# Patient Record
Sex: Male | Born: 1975 | Race: Black or African American | Hispanic: No | Marital: Married | Smoking: Never smoker
Health system: Southern US, Community
[De-identification: ages and names within clinical notes are randomized; demographics above are authoritative.]

## PROBLEM LIST (undated history)

## (undated) DIAGNOSIS — E349 Endocrine disorder, unspecified: Secondary | ICD-10-CM

## (undated) DIAGNOSIS — E785 Hyperlipidemia, unspecified: Secondary | ICD-10-CM

## (undated) DIAGNOSIS — K089 Disorder of teeth and supporting structures, unspecified: Secondary | ICD-10-CM

## (undated) DIAGNOSIS — E559 Vitamin D deficiency, unspecified: Secondary | ICD-10-CM

## (undated) DIAGNOSIS — R000 Tachycardia, unspecified: Secondary | ICD-10-CM

## (undated) DIAGNOSIS — Z8781 Personal history of (healed) traumatic fracture: Secondary | ICD-10-CM

## (undated) DIAGNOSIS — L309 Dermatitis, unspecified: Secondary | ICD-10-CM

## (undated) DIAGNOSIS — K219 Gastro-esophageal reflux disease without esophagitis: Secondary | ICD-10-CM

## (undated) DIAGNOSIS — R5383 Other fatigue: Secondary | ICD-10-CM

## (undated) DIAGNOSIS — M25552 Pain in left hip: Secondary | ICD-10-CM

## (undated) DIAGNOSIS — E119 Type 2 diabetes mellitus without complications: Secondary | ICD-10-CM

## (undated) DIAGNOSIS — I10 Essential (primary) hypertension: Secondary | ICD-10-CM

## (undated) DIAGNOSIS — J302 Other seasonal allergic rhinitis: Secondary | ICD-10-CM

## (undated) DIAGNOSIS — F319 Bipolar disorder, unspecified: Secondary | ICD-10-CM

## (undated) DIAGNOSIS — Z832 Family history of diseases of the blood and blood-forming organs and certain disorders involving the immune mechanism: Secondary | ICD-10-CM

## (undated) DIAGNOSIS — G43909 Migraine, unspecified, not intractable, without status migrainosus: Secondary | ICD-10-CM

## (undated) DIAGNOSIS — N529 Male erectile dysfunction, unspecified: Secondary | ICD-10-CM

## (undated) DIAGNOSIS — G5702 Lesion of sciatic nerve, left lower limb: Secondary | ICD-10-CM

## (undated) DIAGNOSIS — N183 Chronic kidney disease, stage 3 unspecified: Secondary | ICD-10-CM

## (undated) DIAGNOSIS — E213 Hyperparathyroidism, unspecified: Secondary | ICD-10-CM

## (undated) DIAGNOSIS — E118 Type 2 diabetes mellitus with unspecified complications: Secondary | ICD-10-CM

## (undated) DIAGNOSIS — E291 Testicular hypofunction: Secondary | ICD-10-CM

## (undated) HISTORY — DX: Migraine, unspecified, not intractable, without status migrainosus: G43.909

## (undated) HISTORY — DX: Bipolar disorder, unspecified: F31.9

## (undated) HISTORY — DX: Tachycardia, unspecified: R00.0

## (undated) HISTORY — DX: Dermatitis, unspecified: L30.9

## (undated) HISTORY — DX: Morbid (severe) obesity due to excess calories: E66.01

## (undated) HISTORY — DX: Chronic kidney disease, stage 3 unspecified: N18.30

## (undated) HISTORY — DX: Pain in left hip: M25.552

## (undated) HISTORY — DX: Hyperlipidemia, unspecified: E78.5

## (undated) HISTORY — DX: Family history of diseases of the blood and blood-forming organs and certain disorders involving the immune mechanism: Z83.2

## (undated) HISTORY — DX: Other fatigue: R53.83

## (undated) HISTORY — DX: Lesion of sciatic nerve, left lower limb: G57.02

## (undated) HISTORY — DX: Hyperparathyroidism, unspecified: E21.3

## (undated) HISTORY — DX: Disorder of teeth and supporting structures, unspecified: K08.9

## (undated) HISTORY — DX: Male erectile dysfunction, unspecified: N52.9

## (undated) HISTORY — DX: Gastro-esophageal reflux disease without esophagitis: K21.9

## (undated) HISTORY — DX: Type 2 diabetes mellitus with unspecified complications: E11.8

## (undated) HISTORY — DX: Other seasonal allergic rhinitis: J30.2

---

## 2003-08-12 ENCOUNTER — Emergency Department (HOSPITAL_COMMUNITY): Admission: AD | Admit: 2003-08-12 | Discharge: 2003-08-12 | Payer: Self-pay | Admitting: Family Medicine

## 2004-08-27 ENCOUNTER — Emergency Department (HOSPITAL_COMMUNITY): Admission: EM | Admit: 2004-08-27 | Discharge: 2004-08-27 | Payer: Self-pay | Admitting: Emergency Medicine

## 2004-09-04 ENCOUNTER — Emergency Department (HOSPITAL_COMMUNITY): Admission: EM | Admit: 2004-09-04 | Discharge: 2004-09-04 | Payer: Self-pay | Admitting: Family Medicine

## 2004-09-09 ENCOUNTER — Emergency Department (HOSPITAL_COMMUNITY): Admission: EM | Admit: 2004-09-09 | Discharge: 2004-09-09 | Payer: Self-pay | Admitting: Family Medicine

## 2004-09-14 ENCOUNTER — Emergency Department (HOSPITAL_COMMUNITY): Admission: EM | Admit: 2004-09-14 | Discharge: 2004-09-14 | Payer: Self-pay | Admitting: Family Medicine

## 2004-11-07 ENCOUNTER — Emergency Department (HOSPITAL_COMMUNITY): Admission: EM | Admit: 2004-11-07 | Discharge: 2004-11-07 | Payer: Self-pay | Admitting: Family Medicine

## 2005-06-10 ENCOUNTER — Emergency Department (HOSPITAL_COMMUNITY): Admission: EM | Admit: 2005-06-10 | Discharge: 2005-06-10 | Payer: Self-pay | Admitting: Emergency Medicine

## 2006-06-12 ENCOUNTER — Emergency Department (HOSPITAL_COMMUNITY): Admission: EM | Admit: 2006-06-12 | Discharge: 2006-06-12 | Payer: Self-pay | Admitting: Family Medicine

## 2006-06-15 ENCOUNTER — Emergency Department (HOSPITAL_COMMUNITY): Admission: EM | Admit: 2006-06-15 | Discharge: 2006-06-15 | Payer: Self-pay | Admitting: Emergency Medicine

## 2006-09-03 ENCOUNTER — Emergency Department (HOSPITAL_COMMUNITY): Admission: EM | Admit: 2006-09-03 | Discharge: 2006-09-03 | Payer: Self-pay | Admitting: Emergency Medicine

## 2006-09-05 ENCOUNTER — Emergency Department (HOSPITAL_COMMUNITY): Admission: EM | Admit: 2006-09-05 | Discharge: 2006-09-05 | Payer: Self-pay | Admitting: Family Medicine

## 2006-12-08 ENCOUNTER — Emergency Department (HOSPITAL_COMMUNITY): Admission: EM | Admit: 2006-12-08 | Discharge: 2006-12-08 | Payer: Self-pay | Admitting: Emergency Medicine

## 2007-04-24 ENCOUNTER — Observation Stay (HOSPITAL_COMMUNITY): Admission: EM | Admit: 2007-04-24 | Discharge: 2007-04-25 | Payer: Self-pay | Admitting: Emergency Medicine

## 2007-04-24 ENCOUNTER — Ambulatory Visit: Payer: Self-pay | Admitting: *Deleted

## 2007-08-17 ENCOUNTER — Emergency Department (HOSPITAL_COMMUNITY): Admission: EM | Admit: 2007-08-17 | Discharge: 2007-08-17 | Payer: Self-pay | Admitting: Family Medicine

## 2007-12-11 ENCOUNTER — Emergency Department (HOSPITAL_COMMUNITY): Admission: EM | Admit: 2007-12-11 | Discharge: 2007-12-11 | Payer: Self-pay | Admitting: Emergency Medicine

## 2010-04-27 ENCOUNTER — Emergency Department (HOSPITAL_COMMUNITY)
Admission: EM | Admit: 2010-04-27 | Discharge: 2010-04-27 | Payer: Self-pay | Source: Home / Self Care | Admitting: Emergency Medicine

## 2010-04-28 ENCOUNTER — Emergency Department (HOSPITAL_COMMUNITY)
Admission: EM | Admit: 2010-04-28 | Discharge: 2010-04-29 | Payer: Self-pay | Source: Home / Self Care | Admitting: Emergency Medicine

## 2010-09-30 NOTE — Discharge Summary (Signed)
NAMESTEPHANIE, [name] NO.:  [id number]   MEDICAL RECORD NO.:  [id number]          PATIENT TYPE:  INP   LOCATION:  6529                         FACILITY:  [facility]   PHYSICIAN:  Manning Charity, MD     DATE OF BIRTH:  1975-07-21   DATE OF ADMISSION:  04/24/2007  DATE OF DISCHARGE:  04/25/2007                               DISCHARGE SUMMARY   DATE OF ADMISSION:  April 24, 2007   DATE OF DISCHARGE:  April 25, 2007   DISCHARGE DIAGNOSES:  1. Left arm numbness, etiology unclear.  2. Diabetes mellitus type 2.  3. Elevated blood pressure, likely hypertension.  4. Hyperlipidemia.  5. Gastroesophageal reflux disease.   DISCHARGE MEDICATIONS:  1. Metformin 500 mg by mouth twice daily.  2. Actos 30 mg by mouth daily.   CONSULTATIONS:  None.   PROCEDURES:  A plain film of the chest on April 24, 2007,  demonstrated cardiomegaly without evidence of acute cardiopulmonary  disease.   ADMISSION HISTORY:  [name] is a 35 year old African American man  with a history of type 2 diabetes mellitus diagnosed 3 years prior to  admission and the family history of cerebrovascular accidents who  presented in [facility] Emergency Department by EMS on April 24, 2007, complaining of left arm numbness which occurred on waking up in  the morning of admission.  Patient endorsed tremors and slight  dizziness, typically occurring with hypoglycemic attacks, what the  patient gets almost daily.  On the day of admission, he endorsed  numbness lasting for a few seconds, subsequently disappearing after he  drank a beverage containing sugar.  However, the patient's numbness  recurred approximately 10 minutes later.  Because of this recurrent  numbness and associated dizziness and tremors, the patient called EMS.  Although the patient denied any history of frank chest pain, he did  receive sublingual nitroglycerin in the ED had resolution of his  numbness.  He denies any history  of chest pain, shortness of breath,  palpitations, vomiting or burning urination.  He does endorse  intermittent nausea.  Of note, he endorsed taking 10 Extra Strength  Tylenol tablets in the 24 hours prior to admission secondary to  generalized aches and pains.   ADMISSION PHYSICAL EXAMINATION:  VITAL SIGNS:  Temperature 97.2 degrees  Fahrenheit, blood pressure 148/110, pulse 101, respiration rate 22,  oxygen saturation 97% on room air.  GENERAL:  No acute distress.  HEENT:  Pupils equal, round, regular and reactive to light and  accommodation, extraocular movements intact.  Oropharynx clear.  NECK:  Supple without lymphadenopathy or thyromegaly.  RESPIRATORY:  Equal air entry bilaterally.  Clear to auscultation.  CARDIOVASCULAR:  Regular rate and rhythm without murmurs, rubs or  gallops.  GASTROINTESTINAL:  Soft, nontender, nondistended with positive bowel  sounds.  EXTREMITIES:  No edema.  SKIN:  Left forearm with multiple healed scars 1/2 to 1 cm in diameter.  NEUROLOGIC:  Alert and oriented x3.  Cranial nerves II through XII  intact.  Strength 5/5 bilaterally.  Reflex is 2+ and symmetric  bilaterally.  Sensation to light  touch intact bilaterally.  Finger-to-  nose intact.  PSYCHIATRIC:  Appropriate.   ADMISSION LABORATORIES:  Laboratory studies on the day of admission  revealed:  Sodium 137, potassium 3.8, chloride 107, bicarbonate 27, BUN  5, creatinine 0.89, glucose 155, calcium 9.2.  White blood cell count  6.4, hemoglobin 13.8, platelet count 311.  AST 22, ALT 27, alkaline  phosphatase 49, bilirubin 0.6, total protein 7.3, albumin 3.5.  Hemoglobin A1C was 8%.  Urine drug screen was negative.  Urinalysis  revealed specific gravity of 1.034 with greater than 1000 glucose and 30  protein.  A Tylenol level was 15.1 on admission and subsequently less  than 10 approximately 4 hours later.  Cardiac enzymes are negative times  3.   HOSPITAL COURSE:  1. Left arm numbness.  The  patient's initial symptoms were somewhat      confusing.  He did endorse an intermittent left arm numbness, which      was concerning to him, and denied associated chest pain, but did      receive nitroglycerin with some relief in the emergency department.      Because of this, the differential was thought to include acute      coronary syndrome or acute myocardial infarction, pneumothorax,      pneumonia, pulmonary embolus, neuropathy including nerve      impingement, trauma or a tendinopathy or neuropathy.  On further      questioning, the patient also endorsed a history of headaches, so      migraine was considered to be a possible cause.  Work up as entered      previously was felt to be low yield.  The patient was ruled out for      acute MI by EKGs and cardiac enzymes, and again had no chest pain      either before or during admission.  Because of the unusual      characteristics of the patient's left arm numbness, other causes      were felt to be unlikely such as pneumothorax, pneumonia or      pulmonary embolus.  Plain imaging of the chest did not reveal acute      fracture, and the patient did not have a history of trauma to      explain any neurologic injury to the left arm.  Given that the      symptoms resolved relatively quickly and were associated with      headache, it was felt that the most likely cause would be related      to migraine with some component of exacerbation due to recurrent      hypoglycemic episodes.  Patient had no residual deficits at      discharge.  2. Type 2 diabetes mellitus.  Hemoglobin A1C was 8%.  The patient      endorsed a clear history of near daily episodes of hypoglycemia      associated with taking glimepiride.  Because of this, the patient's      glimepiride was changed to metformin.  He did endorse a possible      history of gastrointestinal side effects associated with metformin,      but we elected to try him on this medication given  recurrent      hypoglycemic episodes with the sulfonouria .  3. Increased blood pressure.  This is likely hypertension, though a      formal diagnosis will need to be made on an outpatient  basis after      2 or more blood pressure checks.  We did feel that he would likely      benefit from some lisinopril given his underlying diabetes      mellitus, but given his fluctuating blood pressures and without a      formal diagnosis of hypertension, this medication was not started.      We also advised the patient that weight loss would be helpful.  4. Fluids, electrolytes and nutrition.  The patient was given a carb-      modified diet during this admission.  A fasting lipid profile was      checked and revealed total cholesterol 187, triglycerides 187, LDL      126 and HDL 24.  5. Hyperlipidemia.  See cholesterol results above.  The patient will      likely benefit from a statin in the future, but again this is a      primary care issue.  6. Disposition.  The patient was discharged home in good condition.      Optimization of his blood pressure, lipids, and glucose therapy can      be made as an outpatient.  If the patient has recurrent symptoms of      left arm numbness, especially associated with headache, I would      consider referral to a neurologist.   DISCHARGE LABORATORIES:  Laboratory studies on day of discharge  revealed:  Sodium 135, potassium 3.3 prior to repletion, chloride 102,  bicarbonate 27, BUN 4, creatinine 0.90, glucose 180, calcium 8.7.  White  blood cell count 6.8, hemoglobin 12.8, platelet count 270.  Cardiac  enzymes were negative times 3 sets.   DISCHARGE VITALS:  VITAL SIGNS:  Temperature 98.4 degrees Fahrenheit,  blood pressure 131/61, pulse 85, respiration rate 17, oxygen saturation  96% on room air.  CBGs prior to discharge were 155 and 152.   DISPOSITION AND FOLLOWUP:  The patient is follow up as an outpatient  with Dr. Concepcion Elk on Thursday December 18 at  10:30 a.m.  The patient was  given Dr. Albertina Parr phone number [phone], incase he needs to reschedule  or cancel this appointment.      Madelaine Etienne, MD  Electronically Signed      Manning Charity, MD  Electronically Signed    JH/MEDQ  D:  07/23/2007  T:  07/23/2007  Job:  [id number]   cc:   Fleet Contras, M.D.

## 2011-02-20 LAB — TROPONIN I: Troponin I: 0.02

## 2011-02-20 LAB — DIFFERENTIAL
Basophils Absolute: 0
Basophils Relative: 0
Eosinophils Absolute: 0.1 — ABNORMAL LOW
Eosinophils Relative: 1
Lymphocytes Relative: 41
Lymphs Abs: 2.6
Monocytes Absolute: 0.7
Monocytes Relative: 11
Neutro Abs: 3
Neutrophils Relative %: 47

## 2011-02-20 LAB — COMPREHENSIVE METABOLIC PANEL
ALT: 27
AST: 22
Albumin: 3.5
Alkaline Phosphatase: 49
BUN: 5 — ABNORMAL LOW
CO2: 27
Calcium: 9.2
Chloride: 102
Creatinine, Ser: 0.89
GFR calc Af Amer: >60
GFR calc non Af Amer: >60
Glucose, Bld: 155 — ABNORMAL HIGH
Potassium: 3.8
Sodium: 137
Total Bilirubin: 0.6
Total Protein: 7.3

## 2011-02-20 LAB — BASIC METABOLIC PANEL
BUN: 4 — ABNORMAL LOW
CO2: 27
Calcium: 8.7
Chloride: 102
Creatinine, Ser: 0.9
GFR calc Af Amer: >60
GFR calc non Af Amer: >60
Glucose, Bld: 180 — ABNORMAL HIGH
Potassium: 3.3 — ABNORMAL LOW
Sodium: 135

## 2011-02-20 LAB — CBC
HCT: 37.9 — ABNORMAL LOW
HCT: 41
Hemoglobin: 12.8 — ABNORMAL LOW
Hemoglobin: 13.8
MCHC: 33.6
MCHC: 33.7
MCV: 89.1
MCV: 89.5
Platelets: 270
Platelets: 311
RBC: 4.25
RBC: 4.58
RDW: 12
RDW: 12.4
WBC: 6.4
WBC: 6.8

## 2011-02-20 LAB — POCT CARDIAC MARKERS
CKMB, poc: 1.1
CKMB, poc: <1 — ABNORMAL LOW
Myoglobin, poc: 76.2
Myoglobin, poc: 79.8
Operator id: 198171
Operator id: 282201
Troponin i, poc: <0.05
Troponin i, poc: <0.05

## 2011-02-20 LAB — ACETAMINOPHEN LEVEL
Acetaminophen (Tylenol), Serum: 15.1
Acetaminophen (Tylenol), Serum: <10 — ABNORMAL LOW

## 2011-02-20 LAB — I-STAT 8, (EC8 V) (CONVERTED LAB)
Acid-Base Excess: 3 — ABNORMAL HIGH
BUN: 5 — ABNORMAL LOW
Bicarbonate: 28 — ABNORMAL HIGH
Chloride: 103
Glucose, Bld: 175 — ABNORMAL HIGH
HCT: 45
Hemoglobin: 15.3
Operator id: 282201
Potassium: 3.6
Sodium: 137
TCO2: 29
pCO2, Ven: 41.6 — ABNORMAL LOW
pH, Ven: 7.436 — ABNORMAL HIGH

## 2011-02-20 LAB — CK TOTAL AND CKMB (NOT AT ARMC)
CK, MB: 1.2
Relative Index: 0.7
Total CK: 162

## 2011-02-20 LAB — URINALYSIS, MICROSCOPIC ONLY
Bilirubin Urine: NEGATIVE
Glucose, UA: >1000 — AB
Hgb urine dipstick: NEGATIVE
Ketones, ur: NEGATIVE
Leukocytes, UA: NEGATIVE
Nitrite: NEGATIVE
Protein, ur: 30 — AB
Specific Gravity, Urine: 1.034 — ABNORMAL HIGH
Urobilinogen, UA: 0.2
pH: 6.5

## 2011-02-20 LAB — CARDIAC PANEL(CRET KIN+CKTOT+MB+TROPI)
CK, MB: 0.8
CK, MB: 1.2
Relative Index: 0.6
Relative Index: 0.7
Total CK: 133
Total CK: 169
Troponin I: 0.02
Troponin I: 0.02

## 2011-02-20 LAB — HEMOGLOBIN A1C
Hgb A1c MFr Bld: 8 — ABNORMAL HIGH
Mean Plasma Glucose: 208

## 2011-02-20 LAB — MICROALBUMIN / CREATININE URINE RATIO
Creatinine, Urine: 232.7
Microalb Creat Ratio: 16.6
Microalb, Ur: 3.86 — ABNORMAL HIGH

## 2011-02-20 LAB — RAPID URINE DRUG SCREEN, HOSP PERFORMED
Amphetamines: NOT DETECTED
Barbiturates: NOT DETECTED
Benzodiazepines: NOT DETECTED
Cocaine: NOT DETECTED
Opiates: NOT DETECTED
Tetrahydrocannabinol: NOT DETECTED

## 2011-02-20 LAB — LIPID PANEL
Cholesterol: 187
HDL: 24 — ABNORMAL LOW
LDL Cholesterol: 126 — ABNORMAL HIGH
Total CHOL/HDL Ratio: 7.8
Triglycerides: 187 — ABNORMAL HIGH
VLDL: 37

## 2011-02-20 LAB — POCT I-STAT CREATININE
Creatinine, Ser: 1
Operator id: 282201

## 2013-09-22 ENCOUNTER — Ambulatory Visit: Payer: Self-pay

## 2013-11-03 ENCOUNTER — Ambulatory Visit: Payer: Self-pay

## 2013-12-04 ENCOUNTER — Emergency Department (HOSPITAL_COMMUNITY)
Admission: EM | Admit: 2013-12-04 | Discharge: 2013-12-04 | Disposition: A | Discharge status: Home / Self Care | Payer: Self-pay | Attending: Emergency Medicine | Admitting: Emergency Medicine

## 2013-12-04 ENCOUNTER — Encounter (HOSPITAL_COMMUNITY): Payer: Self-pay | Admitting: Emergency Medicine

## 2013-12-04 DIAGNOSIS — Z79899 Other long term (current) drug therapy: Secondary | ICD-10-CM | POA: Insufficient documentation

## 2013-12-04 DIAGNOSIS — K089 Disorder of teeth and supporting structures, unspecified: Secondary | ICD-10-CM | POA: Insufficient documentation

## 2013-12-04 DIAGNOSIS — X500XXA Overexertion from strenuous movement or load, initial encounter: Secondary | ICD-10-CM | POA: Insufficient documentation

## 2013-12-04 DIAGNOSIS — S66911A Strain of unspecified muscle, fascia and tendon at wrist and hand level, right hand, initial encounter: Secondary | ICD-10-CM

## 2013-12-04 DIAGNOSIS — X503XXA Overexertion from repetitive movements, initial encounter: Secondary | ICD-10-CM | POA: Insufficient documentation

## 2013-12-04 DIAGNOSIS — Y9289 Other specified places as the place of occurrence of the external cause: Secondary | ICD-10-CM | POA: Insufficient documentation

## 2013-12-04 DIAGNOSIS — R6884 Jaw pain: Secondary | ICD-10-CM | POA: Insufficient documentation

## 2013-12-04 DIAGNOSIS — Z7982 Long term (current) use of aspirin: Secondary | ICD-10-CM | POA: Insufficient documentation

## 2013-12-04 DIAGNOSIS — I10 Essential (primary) hypertension: Secondary | ICD-10-CM | POA: Insufficient documentation

## 2013-12-04 DIAGNOSIS — Y9389 Activity, other specified: Secondary | ICD-10-CM | POA: Insufficient documentation

## 2013-12-04 DIAGNOSIS — K029 Dental caries, unspecified: Secondary | ICD-10-CM | POA: Insufficient documentation

## 2013-12-04 DIAGNOSIS — S6390XA Sprain of unspecified part of unspecified wrist and hand, initial encounter: Secondary | ICD-10-CM | POA: Insufficient documentation

## 2013-12-04 DIAGNOSIS — E119 Type 2 diabetes mellitus without complications: Secondary | ICD-10-CM | POA: Insufficient documentation

## 2013-12-04 HISTORY — DX: Essential (primary) hypertension: I10

## 2013-12-04 MED ORDER — IBUPROFEN 600 MG PO TABS: 600.0000 mg | ORAL_TABLET | Freq: Four times a day (QID) | ORAL | Status: DC | PRN

## 2013-12-04 NOTE — ED Notes (Signed)
Pt reports right posterior hand pain that has been ongoing over the past week, which intermittently radiates up his arm. Pt also reports left, posterior, mandibular dental pain. Pt states that the pain and ease of movement of his right hand has improved. Pt is A/O x4, in NAD, and vitals are WDL.

## 2013-12-04 NOTE — ED Provider Notes (Signed)
CSN: [id number]     Arrival date & time 12/04/13  2131 History  This chart was scribed for Marcus FavorGail Montell Leopard, NP, working with Marcus Raceravid Yelverton, MD by Marcus [name], ED Scribe. The patient was seen in room WTR5/WTR5 at 10:20 PM.     Chief Complaint  Patient presents with  . Hand Pain  . Dental Pain    The history is provided by the patient. No language interpreter was used.   Marcus Bassetrick T [name] is a 38 y.o. male with a h/o HTN, and DM without complication who was brought in by parents to the ED complaining of right posterior hand pain onset 1 week. He states that the pain intermittently radiates up his right arm. He states that he is also having left posterior, jaw pain. He states that   He states that the pain and ease of movement of his right hand has improved. He states that he thinks that he pulled a muscle in his hand. He states that he does a lot of lifting. He states that it was swollen and aching. He denies redness or warmth. He denies taking any medications for his hand pain. 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 He states that he is having associated symptoms of numbness.     Marland Kitchen. He states that he takes Lisinopril for his HTN and that was filled in prison. He states that he has been out of prison for a month.    Past Medical History  Diagnosis Date  . Diabetes mellitus without complication   . Hypertension    History reviewed. No pertinent past surgical history. No family history on file. History  Substance Use Topics  . Smoking status: Never Smoker   . Smokeless tobacco: Never Used  . Alcohol Use: Yes     Comment: Socially     Review of Systems  Constitutional: Negative for fever and chills.  HENT: Positive for dental problem (Left jaw pain). Negative for trouble swallowing.   Musculoskeletal: Positive for arthralgias (Right hand ).      Allergies  Review of patient's allergies indicates no known allergies.  Home Medications   Prior to Admission medications    Medication Sig Start Date End Date Taking? Authorizing Provider  aspirin 325 MG tablet Take 325 mg by mouth daily.   Yes Historical Provider, MD  lisinopril (PRINIVIL,ZESTRIL) 10 MG tablet Take 10 mg by mouth daily.   Yes Historical Provider, MD  metFORMIN (GLUCOPHAGE) 500 MG tablet Take 500 mg by mouth 2 (two) times daily with a meal.   Yes Historical Provider, MD  ibuprofen (ADVIL,MOTRIN) 600 MG tablet Take 1 tablet (600 mg total) by mouth every 6 (six) hours as needed. 12/04/13   Marcus FilterGail K Meiya Wisler, NP   BP 152/98  Pulse 89  Temp(Src) 98.1 F (36.7 C) (Oral)  Resp 18  SpO2 99%  Physical Exam  Nursing note and vitals reviewed. Constitutional: He is oriented to person, place, and time. He appears well-developed and well-nourished. No distress.  HENT:  Head: Normocephalic and atraumatic.  Eyes: EOM are normal.  Neck: Neck supple. No tracheal deviation present.  Cardiovascular: Normal rate.   Pulmonary/Chest: Effort normal. No respiratory distress.  Musculoskeletal: Normal range of motion.  Full ROM, no swelling, or break in the skin. extenxive dental work cavity underfilling on the anterior portion of the second molar left bottom.   Neurological: He is alert and oriented to person, place, and time.  Skin: Skin is warm and dry. No erythema.  Psychiatric:  He has a normal mood and affect. His behavior is normal.    ED Course  Procedures (including critical care time) DIAGNOSTIC STUDIES: Oxygen Saturation is 99% on room air, normal by my interpretation.    COORDINATION OF CARE: 10:53 PM-Discussed treatment plan which includes ibuprofen for muscle pain in his hand and tooth pain.  Referral to a dentist, as well as wellness center with pt at bedside and pt agreed to plan.   Labs Review Labs Reviewed - No data to display  Imaging Review No results found.   EKG Interpretation None      MDM   Final diagnoses:  Dental caries  Hand strain, right, initial encounter      I  personally performed the services described in this documentation, which was scribed in my presence. The recorded information has been reviewed and is accurate.    Marcus Filter, NP 12/04/13 2253

## 2013-12-04 NOTE — Discharge Instructions (Signed)
Dental Care and Dentist Visits °Dental care supports good overall health. Regular dental visits can also help you avoid dental pain, bleeding, infection, and other more serious health problems in the future. It is important to keep the mouth healthy because diseases in the teeth, gums, and other oral tissues can spread to other areas of the body. Some problems, such as diabetes, heart disease, and pre-term labor have been associated with poor oral health.  °See your dentist every 6 months. If you experience emergency problems such as a toothache or broken tooth, go to the dentist right away. If you see your dentist regularly, you may catch problems early. It is easier to be treated for problems in the early stages.  °WHAT TO EXPECT AT A DENTIST VISIT  °Your dentist will look for many common oral health problems and recommend proper treatment. At your regular dental visit, you can expect: °· Gentle cleaning of the teeth and gums. This includes scraping and polishing. This helps to remove the sticky substance around the teeth and gums (plaque). Plaque forms in the mouth shortly after eating. Over time, plaque hardens on the teeth as tartar. If tartar is not removed regularly, it can cause problems. Cleaning also helps remove stains. °· Periodic X-rays. These pictures of the teeth and supporting bone will help your dentist assess the health of your teeth. °· Periodic fluoride treatments. Fluoride is a natural mineral shown to help strengthen teeth. Fluoride treatment involves applying a fluoride gel or varnish to the teeth. It is most commonly done in children. °· Examination of the mouth, tongue, jaws, teeth, and gums to look for any oral health problems, such as: °¨ Cavities (dental caries). This is decay on the tooth caused by plaque, sugar, and acid in the mouth. It is best to catch a cavity when it is small. °¨ Inflammation of the gums caused by plaque buildup (gingivitis). °¨ Problems with the mouth or malformed  or misaligned teeth. °¨ Oral cancer or other diseases of the soft tissues or jaws.  °KEEP YOUR TEETH AND GUMS HEALTHY °For healthy teeth and gums, follow these general guidelines as well as your dentist's specific advice: °· Have your teeth professionally cleaned at the dentist every 6 months. °· Brush twice daily with a fluoride toothpaste. °· Floss your teeth daily.  °· Ask your dentist if you need fluoride supplements, treatments, or fluoride toothpaste. °· Eat a healthy diet. Reduce foods and drinks with added sugar. °· Avoid smoking. °TREATMENT FOR ORAL HEALTH PROBLEMS °If you have oral health problems, treatment varies depending on the conditions present in your teeth and gums. °· Your caregiver will most likely recommend good oral hygiene at each visit. °· For cavities, gingivitis, or other oral health disease, your caregiver will perform a procedure to treat the problem. This is typically done at a separate appointment. Sometimes your caregiver will refer you to another dental specialist for specific tooth problems or for surgery. °SEEK IMMEDIATE DENTAL CARE IF: °· You have pain, bleeding, or soreness in the gum, tooth, jaw, or mouth area. °· A permanent tooth becomes loose or separated from the gum socket. °· You experience a blow or injury to the mouth or jaw area. °Document Released: 01/11/2011 Document Revised: 07/24/2011 Document Reviewed: 01/11/2011 °ExitCare® Patient Information ©2015 [employer]. This information is not intended to replace advice given to you by your health care provider. Make sure you discuss any questions you have with your health care provider. ° °Emergency Department Resource Guide °1) Find a Doctor   and Pay Out of Pocket °Although you won't have to find out who is covered by your insurance plan, it is a good idea to ask around and get recommendations. You will then need to call the office and see if the doctor you have chosen will accept you as a new patient and what types of  options they offer for patients who are self-pay. Some doctors offer discounts or will set up payment plans for their patients who do not have insurance, but you will need to ask so you aren't surprised when you get to your appointment. ° °2) Contact Your Local Health Department °Not all health departments have doctors that can see patients for sick visits, but many do, so it is worth a call to see if yours does. If you don't know where your local health department is, you can check in your phone book. The CDC also has a tool to help you locate your state's health department, and many state websites also have listings of all of their local health departments. ° °3) Find a Walk-in Clinic °If your illness is not likely to be very severe or complicated, you may want to try a walk in clinic. These are popping up all over the country in pharmacies, drugstores, and shopping centers. They're usually staffed by nurse practitioners or physician assistants that have been trained to treat common illnesses and complaints. They're usually fairly quick and inexpensive. However, if you have serious medical issues or chronic medical problems, these are probably not your best option. ° °No Primary Care Doctor: °- Call [facility] at  832-8000 - they can help you locate a primary care doctor that  accepts your insurance, provides certain services, etc. °- Physician Referral Service- 1-800-533-3463 ° °Chronic Pain Problems: °Organization         Address  Phone   Notes  °[facility]  (336) 297-2271 Patients need to be referred by their primary care doctor.  ° °Medication Assistance: °Organization         Address  Phone   Notes  °[place] [facility] [address] °[place], [place] [address] (336) 641-8030 --Must be a resident of [place] °-- Must have NO insurance coverage whatsoever (no Medicaid/ Medicare, etc.) °-- The pt. MUST have a primary care doctor that directs  their care regularly and follows them in the community °  °[facility]  (866) 331-1348   °[employer]  (888) 892-1162   ° °Agencies that provide inexpensive medical care: °Organization         Address  Phone   Notes  °[facility]  (336) 832-8035   °[facility]    (336) 832-7272   °[facility] [address] °[place], [place] [address] (336) 832-4777   °[facility] [address], °[place] (336) 271-4999   °[facility]    (336) 373-0678   °[facility]    (336) 272-1050   °[facility] ° [address], [place] Phone:  (336) 832-4444, Fax:  (336) 832-4440 Hours of Operation:  9 am - 6 pm, M-F.  Also accepts Medicaid/Medicare and self-pay.  °[facility] ° [address], [place] Phone: (336) 832-3150, Fax: (336) 832-3151. Hours of Operation:  8:30 am - 5:30 pm, M-F.  Also accepts Medicaid and self-pay.  °[facility] [place] [address], [place] Phone: (336) 878-6027   °  [facility] [address], [place], [place] (336)723-1848, Ext. 123 Mondays & Thursdays: 7-9 AM.  First 15 patients are seen on a first come, first serve basis. °  ° °Medicaid-accepting [place] Providers: ° °Organization         Address  Phone   Notes  °[facility] [address], [place] (336) 641-2100 Also accepts self-pay patients.  °[facility] [address], [place] ° (336) 856-9996   °[facility] [address], [place] (336) 288-8857   °[facility] [address] [place] [address], [place] (336) 299-7000   °Veita Bland [address], [place]  ° (336) 373-1557 Only accepts [place] Access Medicaid patients after they have their name applied to their card.  ° °Self-Pay (no insurance) in [place]: ° °Organization          Address  Phone   Notes  °Sickle Cell Patients, [facility] [address], [place] (336) 832-1970   °[facility] [address], [place] (336) 832-4400   °[facility] ° [address],  (336) 992-4800   °[facility]/Dr. Osei-Bonsu ° [address] [place] [address], [place] or [address], [place] (336) 841-8500 Phone number for both [place] and [place] locations is the same.  °[facility] [address], [place] (336) 299-0000   °[facility] [address] [place] [address], [place] or [address] (336) 852-7530 °(336) 878-2260   °[facility] [address], [place] (336) 350-1642, phone; (336) 294-5005, fax Sees patients 1st and 3rd Saturday of every month.  Must not qualify for public or private insurance (i.e. Medicaid, Medicare, [place] Health Choice, Veterans' Benefits) • Household income should be no more than 200% of the poverty level •The clinic cannot treat you if you are pregnant or think you are pregnant • Sexually transmitted diseases are not treated at the clinic.  ° ° °Dental Care: °Organization         Address  Phone  Notes  °[facility] [address], [place] (336) 641-6152 Accepts children up to age 21 who are enrolled in Medicaid or [place] Health Choice; pregnant women with a Medicaid card; and children who have applied for Medicaid or [place] Health Choice, but were declined, whose parents can pay a reduced fee at time of service.  °[facility] [place]  [address], [place] (336) 641-7733 Accepts children up to age 21 who are enrolled in Medicaid or [place] Health Choice; pregnant women with a Medicaid card; and children who have applied for Medicaid or [place] Health Choice, but were declined, whose parents can pay a reduced fee at time of  service.  °[facility] ° [address], [place] (336) 641-4533 Patients are seen by appointment only. Walk-ins are not accepted. [facility] will see patients 18 years of age and older. °Monday - Tuesday (8am-5pm) °Most Wednesdays (8:30-5pm) °$30 per visit, cash only  °[facility] ° [address], [place] (336) 641-4533 Patients are seen by appointment only. Walk-ins are not accepted. [facility] will see patients 18 years of age and older. °One Wednesday Evening (Monthly: Volunteer Based).  $30 per visit,   cash only  °[facility]  (919) 537-3737 for adults; Children under age 4, call Graduate Pediatric Dentistry at ([phone]) [phone]. Children aged 4-14, please call (919) [phone] to request a pediatric application. ° Dental services are provided in all areas of dental care including fillings, crowns and bridges, complete and partial dentures, implants, gum treatment, root canals, and extractions. Preventive care is also provided. Treatment is provided to both adults and children. °Patients are selected via a lottery and there is often a waiting list. °  °[facility] [address], °[place] ° (336) 763-8833 [email or web address] °  °[facility] [address], [place], [place] (336)723-1848, Ext. 123 Second and Fourth Thursday of each month, opens at 6:30 AM; Clinic ends at 9 AM.  Patients are seen on a first-come first-served basis, and a limited number are seen during each clinic.  ° °[facility] ° [address], [place], [place] (336) 723-7904   Eligibility Requirements °You must have lived in [place], [place], or [place] counties for at least the last three months. °  You cannot be eligible for state or federal sponsored healthcare insurance, including Veterans Administration, Medicaid, or Medicare. °  You generally cannot be eligible for healthcare insurance through your employer.   °  How to apply: °Eligibility screenings are held every Tuesday and Wednesday afternoon from 1:00 pm until 4:00 pm. You do not need an appointment for the interview!  °[facility] [address], [place], [place] 336-631-2330   °[facility]  336-342-8273   °[facility]  336-703-3100   °[facility]  336-570-6415   ° °Behavioral Health Resources in the Community: °Intensive Outpatient Programs °Organization         Address  Phone  Notes  °[facility] [address], [place], [place] 336-878-6098   °[facility] [address], [place], [place] 336-832-9800   °[facility] [address], [place], [place] ° 336-882-2125   °[facility] [address],  °[place], [place] 1-800-853-5163 or 336-641-4981   °Substance Abuse Resources °Organization         Address  Phone  Notes  °[facility]  336-882-2125   °[facility]  336-784-9470   °[facility]  336-285-9073   °[facility]  336-845-3988   °Residential & Outpatient Substance Abuse Program  1-800-659-3381   °Psychological Services °Organization         Address  Phone  Notes  °[facility]  336- 832-9600   °[facility]  336- 378-7881   °[facility] [address], [place] 1-800-853-5163 or 336-641-4981   ° °Mobile Crisis Teams °Organization         Address  Phone  Notes  °[facility], Mobile Crisis Care Unit  1-877-626-1772   °Assertive °[facility] ° [address] [place], [place] 336-834-9664   °Sharon DeEsch [address] °[place] 336-554-5454   ° °Self-Help/Support Groups °Organization         Address  Phone             Notes  °[facility] [place] - variety of support groups  336- 373-1402 Call for more information  °Narcotics Anonymous (NA), [facility] [address], °[place]   2 meetings at this location  ° °Residential Treatment Programs °Organization           Address  Phone  Notes  °[facility] [address],    °[place]  1-866-801-8205   °[facility] ° [address], [place], [place] 704-293-8524   °[facility] [address], [place] 336-845-3988 Admissions: 8am-3pm M-F  °[facility] [address]    °[place], [place] 336-841-1104   °[facility] [address], [place], [place] 336-379-7146   °[facility] [address]  °[place], [place] 336-285-9073   °[facility] Intensive Outpatient [address], [place], [place] 336-852-3033   °[facility] [address]  °[place], [place] 1-877-615-2722 or 336-784-9470   °[facility] [address] [place], [place] 336-227-7417 Accepts Medicaid  °[facility] [address]  °[place] 1-800-659-3381 Substance Abuse/Addiction Treatment  ° °[place] Behavioral Health Resources °Organization         Address  Phone  Notes  °[facility]  (888) 581-9988   °Julie Brannon, PhD [address] [place], [place]   (336) 349-5553 or (336) 951-0000   °[facility]   [address] °[place], [place] (336) 349-4454   °[facility] [address], [place], [place] (336) 342-8316 Insurance/Medicaid/sponsorship through [facility]  °[facility] [address]                                    [place], [place] (336) 342-8316 Therapy/tele-psych/case  °[facility] [address]  ° [place], [place] (336) 349-2233    °Dr. Arfeen  (336) 349-4544   °[facility] [place]  [employer] [place] [facility] 1) [address], [place] °2) [address], [place] °3)  [address], [place] (336) 349-3220 °(336) 342-7768 ° °(336) 342-8140   °[place] [facility]  [facility] (336) 342-1394 or (336) 342-3537 (After Hours)    ° °

## 2013-12-04 NOTE — ED Notes (Signed)
Initial contact - pt a+ox4, reports R hand pain and L sided dental pain.  Reports hand pain has been improving, moving extremity without issue.  Denies fevers/chills.  Skin PWD.  NAD.

## 2013-12-05 NOTE — ED Provider Notes (Signed)
Medical screening examination/treatment/procedure(s) were performed by non-physician practitioner and as supervising physician I was immediately available for consultation/collaboration.   EKG Interpretation None        Adahlia Stembridge, MD 12/05/13 0642 

## 2014-06-04 ENCOUNTER — Ambulatory Visit: Payer: Self-pay | Admitting: Family Medicine

## 2014-06-05 ENCOUNTER — Ambulatory Visit: Payer: Self-pay | Admitting: Family Medicine

## 2014-06-15 ENCOUNTER — Encounter: Payer: Self-pay | Admitting: Family Medicine

## 2014-06-15 ENCOUNTER — Ambulatory Visit (INDEPENDENT_AMBULATORY_CARE_PROVIDER_SITE_OTHER): Payer: BLUE CROSS/BLUE SHIELD | Admitting: Family Medicine

## 2014-06-15 VITALS — BP 151/111 | HR 102 | Temp 98.6°F | Ht 66.5 in | Wt 255.0 lb

## 2014-06-15 DIAGNOSIS — L309 Dermatitis, unspecified: Secondary | ICD-10-CM | POA: Insufficient documentation

## 2014-06-15 DIAGNOSIS — I10 Essential (primary) hypertension: Secondary | ICD-10-CM | POA: Insufficient documentation

## 2014-06-15 DIAGNOSIS — E118 Type 2 diabetes mellitus with unspecified complications: Secondary | ICD-10-CM | POA: Insufficient documentation

## 2014-06-15 DIAGNOSIS — E119 Type 2 diabetes mellitus without complications: Secondary | ICD-10-CM | POA: Diagnosis not present

## 2014-06-15 DIAGNOSIS — Z0000 Encounter for general adult medical examination without abnormal findings: Secondary | ICD-10-CM | POA: Diagnosis not present

## 2014-06-15 DIAGNOSIS — E1165 Type 2 diabetes mellitus with hyperglycemia: Secondary | ICD-10-CM

## 2014-06-15 DIAGNOSIS — E1159 Type 2 diabetes mellitus with other circulatory complications: Secondary | ICD-10-CM | POA: Insufficient documentation

## 2014-06-15 HISTORY — DX: Dermatitis, unspecified: L30.9

## 2014-06-15 LAB — POCT GLYCOSYLATED HEMOGLOBIN (HGB A1C): Hemoglobin A1C: 15

## 2014-06-15 LAB — CBC
HCT: 42.2 % (ref 39.0–52.0)
Hemoglobin: 14.8 g/dL (ref 13.0–17.0)
MCH: 30.1 pg (ref 26.0–34.0)
MCHC: 35.1 g/dL (ref 30.0–36.0)
MCV: 85.8 fL (ref 78.0–100.0)
MPV: 10.3 fL (ref 8.6–12.4)
Platelets: 248 10*3/uL (ref 150–400)
RBC: 4.92 MIL/uL (ref 4.22–5.81)
RDW: 12.7 % (ref 11.5–15.5)
WBC: 5.5 10*3/uL (ref 4.0–10.5)

## 2014-06-15 LAB — LIPID PANEL
Cholesterol: 233 mg/dL — ABNORMAL HIGH (ref 0–200)
HDL: 41 mg/dL (ref >39)
LDL Cholesterol: 154 mg/dL — ABNORMAL HIGH (ref 0–99)
Total CHOL/HDL Ratio: 5.7 Ratio
Triglycerides: 189 mg/dL — ABNORMAL HIGH (ref <150)
VLDL: 38 mg/dL (ref 0–40)

## 2014-06-15 LAB — COMPREHENSIVE METABOLIC PANEL
ALT: 20 U/L (ref 0–53)
AST: 17 U/L (ref 0–37)
Albumin: 4.1 g/dL (ref 3.5–5.2)
Alkaline Phosphatase: 79 U/L (ref 39–117)
BUN: 8 mg/dL (ref 6–23)
CO2: 26 mEq/L (ref 19–32)
Calcium: 9.2 mg/dL (ref 8.4–10.5)
Chloride: 101 mEq/L (ref 96–112)
Creat: 1.16 mg/dL (ref 0.50–1.35)
Glucose, Bld: 298 mg/dL — ABNORMAL HIGH (ref 70–99)
Potassium: 3.9 mEq/L (ref 3.5–5.3)
Sodium: 133 mEq/L — ABNORMAL LOW (ref 135–145)
Total Bilirubin: 0.5 mg/dL (ref 0.2–1.2)
Total Protein: 7.4 g/dL (ref 6.0–8.3)

## 2014-06-15 MED ORDER — METFORMIN HCL 500 MG PO TABS: 500.0000 mg | ORAL_TABLET | Freq: Two times a day (BID) | ORAL | Status: DC

## 2014-06-15 MED ORDER — TRIAMCINOLONE ACETONIDE 0.1 % EX CREA: 1.0000 "application " | TOPICAL_CREAM | Freq: Two times a day (BID) | CUTANEOUS | Status: DC

## 2014-06-15 MED ORDER — LISINOPRIL 10 MG PO TABS: 10.0000 mg | ORAL_TABLET | Freq: Every day | ORAL | Status: DC

## 2014-06-15 NOTE — Patient Instructions (Addendum)
It was nice to meet you. I will call you if your lab results are not normal Please follow up with me in 2-3 weeks so we can check you blood work and see how your blood pressure is doing.

## 2014-06-15 NOTE — Progress Notes (Signed)
Subjective: CC: Establish care  HPI: Patient is a 39 y.o. male presenting to clinic today to establish care.  Hypertension:  Has had HTN x 3-4years; was previously on lisinopril, last took it in June/July 2015. Patient denies blurred vision, chest pain, or headaches. Occasionally gets light-headed or dizzy "when things get on my nerves."   Diabetes: Diagnosed 4-5years ago. Was taking metformin  BID. Last took this medication in June/July 2015. Patient states his CBGs were ranging from 90s-110s when he was checking his blood sugars, however he has not done this recently. Patient denies any side effects of the metformin, however notes when he initially started taking it, he had acid reflux that eventually resolved. The patient endorses polydipsia, polyuria, noturia x 5 each night, and urinary incontinence for the last few weeks. Denies dysuria or penile discharge. States he was tested prior to his release in April and he was negative   Patient wanted disability: Currently works at [employer].  Can't stand long periods of time- he feels like his legs are weak. Also feels his "oxygenation" isn't what it used to be.  Unfortunately, unable to discuss this further as the patient obtained a call about work and was eager to leave.   PMHx: Schizophrenia (per report) HTN Type 2 diabetes mellitus  Acid reflux per report  Surgeries: Left arm surgery from work injury (cut with glass)  Social History: Patient currently works at [employer] (does different jobs).  Patient was incarcerated for 3 years, was released in April 2015. Denies smoking history. Drinks 0.5pint twice a week: Patient states his drinking has never interfered with his job initially, however later states there have been days after drinking when "he wasn't with it" and would miss things and his job would ask him to go home for the day. He states people have also noted that he gets "mean" when he drinks. His son states he  doesn't feel his father gets mean, he just "acts like one would think after drinking."    ROS: All other systems reviewed and are negative.  Past Medical History Patient Active Problem List   Diagnosis Date Noted  . Eczema 06/15/2014  . Health maintenance examination 06/15/2014  . Diabetes 06/15/2014  . Essential hypertension 06/15/2014    Medications- reviewed and updated Current Outpatient Prescriptions  Medication Sig Dispense Refill  . aspirin 325 MG tablet Take 325 mg by mouth daily.    Marland Kitchen ibuprofen (ADVIL,MOTRIN) 600 MG tablet Take 1 tablet (600 mg total) by mouth every 6 (six) hours as needed. 30 tablet 0  . lisinopril (PRINIVIL,ZESTRIL) 10 MG tablet Take 1 tablet (10 mg total) by mouth daily. 30 tablet 3  . metFORMIN (GLUCOPHAGE) 500 MG tablet Take 1 tablet (500 mg total) by mouth 2 (two) times daily with a meal. 60 tablet 0  . triamcinolone cream (KENALOG) 0.1 % Apply 1 application topically 2 (two) times daily. 45 g 0   No current facility-administered medications for this visit.    Objective: Office vital signs reviewed. BP 151/111 mmHg  Pulse 102  Temp(Src) 98.6 F (37 C) (Oral)  Ht 5' 6.5" (1.689 m)  Wt 115.667 kg (255 lb)  BMI 40.55 kg/m2 Repeat manual BP: 146/108  Physical Examination:  General: Awake, alert, well- nourished, NAD HEENT: Normal    Neck: No masses palpated. No LAD, no thyromegaly    Eyes: PERRLA, EOMI    Nose: nasal turbinates moist, clear discharge    Throat: MMM, no erythema Cardio:  Tachycardic, no murmurs, rubs, or gallops appreciated Pulm: CTAB, no wheezes, rhonchi or crackles noted GI: Obese, soft, NT/ND,+BS x4, no hepatomegaly, no splenomegaly however difficult due to habitus  Extremities: No edema, cyanosis or clubbing; +2 pulses bilaterally MSK: Normal gait and station Skin: very dry skin over the back without skin breakdown, erythema, or drainage Neuro: Strength and sensation grossly intact  Assessment/Plan: Essential  hypertension Patient's BP grossly elevated on exam, both by RN and by manual check by MD.  Patient denies any current symptoms: no headache, change in vision, abdominal pain, chest pain, N/V. I suspect his BP has been this elevated for quite sometime. - Will check baseline CMET today  - Re-start lisinopril 10mg  today, the patient may require additional anti-hypertensives, consider HCTZ or amlodipine  - Patient to return to clinic in 2-3 weeks to recheck BP and repeat BMET - Discussed symptoms of hypertensive urgency and emergency and reasons to seek medical care, pt voice understanding.   Diabetes Patient states he's been on metformin 500mg  BID in the past. At that time, he states his blood sugars were well controlled. Patient did not want to wait for his hemoglobin A1c results to discuss additional treatment options- simply wanted to be started back on metformin and stated we could discuss additional treatment options in the future if necessary. A1c was found to be grossly elevated at 15 at this visit. Called patient to discuss his results, as this means his blood sugars average ~380. Asked the patient to make an appt to meet with Dr. Raymondo BandKoval, as I do not think his blood glucose can be controlled with oral medications alone. Patient initially resistant to following up with Dr. Raymondo BandKoval as he states "I'm not going to stick myself." We discussed that many of his symptoms would most likely improve (polyuria, polydipsia, nocturia) with improvement in hyperglycemia. - Currently on metformin 500mg  BID, if tolerating will increase to 1000mg  BID - Patient agreed to make next available appt with Dr. Raymondo BandKoval (states he understands the severity). - Patient requires teaching for checking his CBGs and possibly insulin administration. - Lipid panel ordered and pending- will most likely need high-intensity statin.   Eczema No signs of infection. "Diabetic lotion" not beneficial. - Will start triamcinolone cream-  advised patient to only put this on rash area, as it can cause skin thinning and discoloration. - Advised to continue non-fragrant lotions BID, avoid scented lotions, and avoid hot showers. - Patient will RTC if his rash worsens.     Orders Placed This Encounter  Procedures  . Comprehensive metabolic panel  . Lipid Panel  . CBC  . HgB A1c    Meds ordered this encounter  Medications  . lisinopril (PRINIVIL,ZESTRIL) 10 MG tablet    Sig: Take 1 tablet (10 mg total) by mouth daily.    Dispense:  30 tablet    Refill:  3  . triamcinolone cream (KENALOG) 0.1 %    Sig: Apply 1 application topically 2 (two) times daily.    Dispense:  45 g    Refill:  0  . metFORMIN (GLUCOPHAGE) 500 MG tablet    Sig: Take 1 tablet (500 mg total) by mouth 2 (two) times daily with a meal.    Dispense:  60 tablet    Refill:  0    [name] S. Emmogene Simson, MD PGY-1, [facility]

## 2014-06-16 ENCOUNTER — Encounter: Payer: Self-pay | Admitting: Family Medicine

## 2014-06-16 NOTE — Assessment & Plan Note (Addendum)
Patient states he's been on metformin 500mg  BID in the past. At that time, he states his blood sugars were well controlled. Patient did not want to wait for his hemoglobin A1c results to discuss additional treatment options- simply wanted to be started back on metformin and stated we could discuss additional treatment options in the future if necessary. A1c was found to be grossly elevated at 15 at this visit. Called patient to discuss his results, as this means his blood sugars average ~380. Asked the patient to make an appt to meet with Dr. Raymondo BandKoval, as I do not think his blood glucose can be controlled with oral medications alone. Patient initially resistant to following up with Dr. Raymondo BandKoval as he states "I'm not going to stick myself." We discussed that many of his symptoms would most likely improve (polyuria, polydipsia, nocturia) with improvement in hyperglycemia. - Currently on metformin 500mg  BID, if tolerating will increase to 1000mg  BID - Patient agreed to make next available appt with Dr. Raymondo BandKoval (states he understands the severity). - Patient requires teaching for checking his CBGs and possibly insulin administration. - Lipid panel ordered and pending- will most likely need high-intensity statin.

## 2014-06-16 NOTE — Assessment & Plan Note (Signed)
Patient's BP grossly elevated on exam, both by RN and by manual check by MD.  Patient denies any current symptoms: no headache, change in vision, abdominal pain, chest pain, N/V. I suspect his BP has been this elevated for quite sometime. - Will check baseline CMET today  - Re-start lisinopril 10mg  today, the patient may require additional anti-hypertensives, consider HCTZ or amlodipine  - Patient to return to clinic in 2-3 weeks to recheck BP and repeat BMET - Discussed symptoms of hypertensive urgency and emergency and reasons to seek medical care, pt voice understanding.

## 2014-06-16 NOTE — Assessment & Plan Note (Signed)
No signs of infection. "Diabetic lotion" not beneficial. - Will start triamcinolone cream- advised patient to only put this on rash area, as it can cause skin thinning and discoloration. - Advised to continue non-fragrant lotions BID, avoid scented lotions, and avoid hot showers. - Patient will RTC if his rash worsens.

## 2014-06-24 ENCOUNTER — Encounter: Payer: Self-pay | Admitting: Family Medicine

## 2014-06-25 ENCOUNTER — Telehealth: Payer: Self-pay | Admitting: *Deleted

## 2014-06-25 ENCOUNTER — Encounter: Payer: Self-pay | Admitting: Family Medicine

## 2014-06-25 NOTE — Telephone Encounter (Signed)
-----   Message from [name] S Dorsey, MD sent at 06/21/2014  3:26 PM EST ----- Would you please call this patient to make an appt with Dr. Raymondo BandKoval for diabetes- I called and asked him to make an appt a week ago however I do not see an appt. Please stress the importance of him making an appt and showing up.  Thanks, [employer]ough[employer]rystal

## 2014-06-25 NOTE — Telephone Encounter (Signed)
Tried calling number provided, was informed I had the wrorng number.

## 2014-07-14 ENCOUNTER — Ambulatory Visit: Payer: BLUE CROSS/BLUE SHIELD | Admitting: Family Medicine

## 2014-07-16 ENCOUNTER — Other Ambulatory Visit: Payer: Self-pay | Admitting: Family Medicine

## 2014-07-16 NOTE — Telephone Encounter (Signed)
Left voicemail with message from MD.

## 2014-07-16 NOTE — Telephone Encounter (Signed)
Please call the patient and let him know I refilled the Metformin, however I increased his dose as we previously talked about to 2 tablets (1000mg ) in the morning and 2 tablets in the evening. Please also ask him to make a f/u appt with Dr. Raymondo BandKoval as he never made one with him and missed his follow up appointment with me.  Thanks, [name] S. Dorsey, MD [facility] Res[facility]  07/16/2014, 9:29 AM

## 2014-08-01 ENCOUNTER — Emergency Department (HOSPITAL_COMMUNITY): Payer: BLUE CROSS/BLUE SHIELD

## 2014-08-01 ENCOUNTER — Inpatient Hospital Stay (HOSPITAL_COMMUNITY)
Admission: EM | Admit: 2014-08-01 | Discharge: 2014-08-14 | DRG: 513 | Disposition: A | Discharge status: Home Health | Payer: BLUE CROSS/BLUE SHIELD | Attending: Orthopaedic Surgery | Admitting: Orthopaedic Surgery

## 2014-08-01 ENCOUNTER — Encounter (HOSPITAL_COMMUNITY): Payer: Self-pay | Admitting: Emergency Medicine

## 2014-08-01 DIAGNOSIS — R451 Restlessness and agitation: Secondary | ICD-10-CM | POA: Diagnosis present

## 2014-08-01 DIAGNOSIS — K047 Periapical abscess without sinus: Secondary | ICD-10-CM

## 2014-08-01 DIAGNOSIS — E8881 Metabolic syndrome: Secondary | ICD-10-CM | POA: Diagnosis present

## 2014-08-01 DIAGNOSIS — E669 Obesity, unspecified: Secondary | ICD-10-CM | POA: Diagnosis present

## 2014-08-01 DIAGNOSIS — Z418 Encounter for other procedures for purposes other than remedying health state: Secondary | ICD-10-CM

## 2014-08-01 DIAGNOSIS — F10929 Alcohol use, unspecified with intoxication, unspecified: Secondary | ICD-10-CM | POA: Diagnosis present

## 2014-08-01 DIAGNOSIS — E1165 Type 2 diabetes mellitus with hyperglycemia: Secondary | ICD-10-CM | POA: Diagnosis present

## 2014-08-01 DIAGNOSIS — S060X9A Concussion with loss of consciousness of unspecified duration, initial encounter: Secondary | ICD-10-CM | POA: Diagnosis present

## 2014-08-01 DIAGNOSIS — E559 Vitamin D deficiency, unspecified: Secondary | ICD-10-CM

## 2014-08-01 DIAGNOSIS — R52 Pain, unspecified: Secondary | ICD-10-CM

## 2014-08-01 DIAGNOSIS — R740 Nonspecific elevation of levels of transaminase and lactic acid dehydrogenase [LDH]: Secondary | ICD-10-CM | POA: Diagnosis present

## 2014-08-01 DIAGNOSIS — S63391A Traumatic rupture of other ligament of right wrist, initial encounter: Secondary | ICD-10-CM | POA: Diagnosis present

## 2014-08-01 DIAGNOSIS — S62031A Displaced fracture of proximal third of navicular [scaphoid] bone of right wrist, initial encounter for closed fracture: Secondary | ICD-10-CM | POA: Diagnosis present

## 2014-08-01 DIAGNOSIS — L299 Pruritus, unspecified: Secondary | ICD-10-CM | POA: Diagnosis not present

## 2014-08-01 DIAGNOSIS — Z7982 Long term (current) use of aspirin: Secondary | ICD-10-CM

## 2014-08-01 DIAGNOSIS — E349 Endocrine disorder, unspecified: Secondary | ICD-10-CM

## 2014-08-01 DIAGNOSIS — S63309A Traumatic rupture of unspecified ligament of unspecified wrist, initial encounter: Secondary | ICD-10-CM | POA: Diagnosis present

## 2014-08-01 DIAGNOSIS — R000 Tachycardia, unspecified: Secondary | ICD-10-CM

## 2014-08-01 DIAGNOSIS — E213 Hyperparathyroidism, unspecified: Secondary | ICD-10-CM

## 2014-08-01 DIAGNOSIS — Z531 Procedure and treatment not carried out because of patient's decision for reasons of belief and group pressure: Secondary | ICD-10-CM | POA: Diagnosis present

## 2014-08-01 DIAGNOSIS — Z8249 Family history of ischemic heart disease and other diseases of the circulatory system: Secondary | ICD-10-CM

## 2014-08-01 DIAGNOSIS — Z419 Encounter for procedure for purposes other than remedying health state, unspecified: Secondary | ICD-10-CM

## 2014-08-01 DIAGNOSIS — Y907 Blood alcohol level of 200-239 mg/100 ml: Secondary | ICD-10-CM | POA: Diagnosis present

## 2014-08-01 DIAGNOSIS — R509 Fever, unspecified: Secondary | ICD-10-CM

## 2014-08-01 DIAGNOSIS — D62 Acute posthemorrhagic anemia: Secondary | ICD-10-CM | POA: Diagnosis not present

## 2014-08-01 DIAGNOSIS — I10 Essential (primary) hypertension: Secondary | ICD-10-CM | POA: Diagnosis present

## 2014-08-01 DIAGNOSIS — S52501A Unspecified fracture of the lower end of right radius, initial encounter for closed fracture: Secondary | ICD-10-CM | POA: Diagnosis present

## 2014-08-01 DIAGNOSIS — S060XAA Concussion with loss of consciousness status unknown, initial encounter: Secondary | ICD-10-CM | POA: Diagnosis present

## 2014-08-01 DIAGNOSIS — S32422A Displaced fracture of posterior wall of left acetabulum, initial encounter for closed fracture: Secondary | ICD-10-CM | POA: Diagnosis not present

## 2014-08-01 DIAGNOSIS — S060X0A Concussion without loss of consciousness, initial encounter: Secondary | ICD-10-CM | POA: Diagnosis present

## 2014-08-01 DIAGNOSIS — N179 Acute kidney failure, unspecified: Secondary | ICD-10-CM | POA: Diagnosis not present

## 2014-08-01 DIAGNOSIS — R7401 Elevation of levels of liver transaminase levels: Secondary | ICD-10-CM | POA: Diagnosis not present

## 2014-08-01 DIAGNOSIS — F10129 Alcohol abuse with intoxication, unspecified: Secondary | ICD-10-CM | POA: Diagnosis present

## 2014-08-01 DIAGNOSIS — S2241XA Multiple fractures of ribs, right side, initial encounter for closed fracture: Secondary | ICD-10-CM | POA: Diagnosis present

## 2014-08-01 DIAGNOSIS — R4182 Altered mental status, unspecified: Secondary | ICD-10-CM

## 2014-08-01 DIAGNOSIS — S52511A Displaced fracture of right radial styloid process, initial encounter for closed fracture: Secondary | ICD-10-CM | POA: Diagnosis present

## 2014-08-01 DIAGNOSIS — S32402A Unspecified fracture of left acetabulum, initial encounter for closed fracture: Secondary | ICD-10-CM

## 2014-08-01 DIAGNOSIS — E291 Testicular hypofunction: Secondary | ICD-10-CM

## 2014-08-01 DIAGNOSIS — Z833 Family history of diabetes mellitus: Secondary | ICD-10-CM

## 2014-08-01 DIAGNOSIS — T1490XA Injury, unspecified, initial encounter: Secondary | ICD-10-CM

## 2014-08-01 DIAGNOSIS — Z79899 Other long term (current) drug therapy: Secondary | ICD-10-CM

## 2014-08-01 DIAGNOSIS — Z6841 Body Mass Index (BMI) 40.0 and over, adult: Secondary | ICD-10-CM

## 2014-08-01 HISTORY — DX: Testicular hypofunction: E29.1

## 2014-08-01 HISTORY — DX: Vitamin D deficiency, unspecified: E55.9

## 2014-08-01 HISTORY — DX: Endocrine disorder, unspecified: E34.9

## 2014-08-01 LAB — COMPREHENSIVE METABOLIC PANEL
ALT: 55 U/L — ABNORMAL HIGH (ref 0–53)
AST: 94 U/L — ABNORMAL HIGH (ref 0–37)
Albumin: 3.6 g/dL (ref 3.5–5.2)
Alkaline Phosphatase: 49 U/L (ref 39–117)
Anion gap: 13 (ref 5–15)
BUN: 10 mg/dL (ref 6–23)
CO2: 22 mmol/L (ref 19–32)
Calcium: 8.7 mg/dL (ref 8.4–10.5)
Chloride: 102 mmol/L (ref 96–112)
Creatinine, Ser: 0.93 mg/dL (ref 0.50–1.35)
GFR calc Af Amer: >90 mL/min (ref >90)
GFR calc non Af Amer: >90 mL/min (ref >90)
Glucose, Bld: 215 mg/dL — ABNORMAL HIGH (ref 70–99)
Potassium: 3.6 mmol/L (ref 3.5–5.1)
Sodium: 137 mmol/L (ref 135–145)
Total Bilirubin: 0.5 mg/dL (ref 0.3–1.2)
Total Protein: 7.3 g/dL (ref 6.0–8.3)

## 2014-08-01 LAB — CBC WITH DIFFERENTIAL/PLATELET
Basophils Absolute: 0 10*3/uL (ref 0.0–0.1)
Basophils Relative: 0 % (ref 0–1)
Eosinophils Absolute: 0.1 10*3/uL (ref 0.0–0.7)
Eosinophils Relative: 1 % (ref 0–5)
HCT: 43.4 % (ref 39.0–52.0)
Hemoglobin: 15.1 g/dL (ref 13.0–17.0)
Lymphocytes Relative: 22 % (ref 12–46)
Lymphs Abs: 1.6 10*3/uL (ref 0.7–4.0)
MCH: 31.2 pg (ref 26.0–34.0)
MCHC: 34.8 g/dL (ref 30.0–36.0)
MCV: 89.7 fL (ref 78.0–100.0)
Monocytes Absolute: 0.3 10*3/uL (ref 0.1–1.0)
Monocytes Relative: 5 % (ref 3–12)
Neutro Abs: 5.4 10*3/uL (ref 1.7–7.7)
Neutrophils Relative %: 72 % (ref 43–77)
Platelets: 228 10*3/uL (ref 150–400)
RBC: 4.84 MIL/uL (ref 4.22–5.81)
RDW: 12.5 % (ref 11.5–15.5)
WBC: 7.4 10*3/uL (ref 4.0–10.5)

## 2014-08-01 LAB — I-STAT CHEM 8, ED
BUN: 11 mg/dL (ref 6–23)
Calcium, Ion: 1.08 mmol/L — ABNORMAL LOW (ref 1.12–1.23)
Chloride: 104 mmol/L (ref 96–112)
Creatinine, Ser: 1.2 mg/dL (ref 0.50–1.35)
Glucose, Bld: 218 mg/dL — ABNORMAL HIGH (ref 70–99)
HCT: 45 % (ref 39.0–52.0)
Hemoglobin: 15.3 g/dL (ref 13.0–17.0)
Potassium: 3.6 mmol/L (ref 3.5–5.1)
Sodium: 141 mmol/L (ref 135–145)
TCO2: 19 mmol/L (ref 0–100)

## 2014-08-01 LAB — ETHANOL: Alcohol, Ethyl (B): 225 mg/dL — ABNORMAL HIGH (ref 0–9)

## 2014-08-01 LAB — CBG MONITORING, ED: Glucose-Capillary: 183 mg/dL — ABNORMAL HIGH (ref 70–99)

## 2014-08-01 MED ORDER — SODIUM CHLORIDE 0.9 % IV BOLUS (SEPSIS)
1000.0000 mL | Freq: Once | INTRAVENOUS | Status: AC
  Administered 2014-08-02: 1000 mL via INTRAVENOUS

## 2014-08-01 MED ORDER — TETANUS-DIPHTH-ACELL PERTUSSIS 5-2.5-18.5 LF-MCG/0.5 IM SUSP
0.5000 mL | Freq: Once | INTRAMUSCULAR | Status: AC
  Administered 2014-08-02: 0.5 mL via INTRAMUSCULAR
  Filled 2014-08-01: qty 0.5

## 2014-08-01 MED ORDER — ZIPRASIDONE MESYLATE 20 MG IM SOLR
20.0000 mg | Freq: Once | INTRAMUSCULAR | Status: AC
  Administered 2014-08-01: 20 mg via INTRAMUSCULAR
  Filled 2014-08-01: qty 20
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
## 2014-08-01 NOTE — ED Notes (Signed)
Pt placed in a gown and hooked up to the monitor with 12 lead, BP cuff and pulse ox

## 2014-08-01 NOTE — ED Notes (Signed)
CBG was 183.

## 2014-08-01 NOTE — ED Notes (Signed)
[employer] at bedside.  Pt beginning to feel agitated.  Pt unwilling to cooperate with RN at this time.  Wife to go to CT with pt for comfort.

## 2014-08-02 ENCOUNTER — Inpatient Hospital Stay (HOSPITAL_COMMUNITY): Payer: BLUE CROSS/BLUE SHIELD

## 2014-08-02 ENCOUNTER — Emergency Department (HOSPITAL_COMMUNITY): Payer: BLUE CROSS/BLUE SHIELD

## 2014-08-02 ENCOUNTER — Encounter (HOSPITAL_COMMUNITY): Payer: Self-pay

## 2014-08-02 DIAGNOSIS — Z6841 Body Mass Index (BMI) 40.0 and over, adult: Secondary | ICD-10-CM | POA: Diagnosis not present

## 2014-08-02 DIAGNOSIS — Z531 Procedure and treatment not carried out because of patient's decision for reasons of belief and group pressure: Secondary | ICD-10-CM | POA: Diagnosis present

## 2014-08-02 DIAGNOSIS — R740 Nonspecific elevation of levels of transaminase and lactic acid dehydrogenase [LDH]: Secondary | ICD-10-CM | POA: Diagnosis present

## 2014-08-02 DIAGNOSIS — Z8249 Family history of ischemic heart disease and other diseases of the circulatory system: Secondary | ICD-10-CM | POA: Diagnosis not present

## 2014-08-02 DIAGNOSIS — E8881 Metabolic syndrome: Secondary | ICD-10-CM | POA: Diagnosis not present

## 2014-08-02 DIAGNOSIS — N179 Acute kidney failure, unspecified: Secondary | ICD-10-CM | POA: Diagnosis not present

## 2014-08-02 DIAGNOSIS — R000 Tachycardia, unspecified: Secondary | ICD-10-CM | POA: Diagnosis present

## 2014-08-02 DIAGNOSIS — M7989 Other specified soft tissue disorders: Secondary | ICD-10-CM | POA: Diagnosis not present

## 2014-08-02 DIAGNOSIS — F10129 Alcohol abuse with intoxication, unspecified: Secondary | ICD-10-CM | POA: Diagnosis present

## 2014-08-02 DIAGNOSIS — S32402A Unspecified fracture of left acetabulum, initial encounter for closed fracture: Secondary | ICD-10-CM | POA: Diagnosis present

## 2014-08-02 DIAGNOSIS — D62 Acute posthemorrhagic anemia: Secondary | ICD-10-CM | POA: Diagnosis not present

## 2014-08-02 DIAGNOSIS — E291 Testicular hypofunction: Secondary | ICD-10-CM | POA: Diagnosis present

## 2014-08-02 DIAGNOSIS — L299 Pruritus, unspecified: Secondary | ICD-10-CM | POA: Diagnosis not present

## 2014-08-02 DIAGNOSIS — E559 Vitamin D deficiency, unspecified: Secondary | ICD-10-CM | POA: Diagnosis present

## 2014-08-02 DIAGNOSIS — S2241XA Multiple fractures of ribs, right side, initial encounter for closed fracture: Secondary | ICD-10-CM | POA: Diagnosis present

## 2014-08-02 DIAGNOSIS — R451 Restlessness and agitation: Secondary | ICD-10-CM | POA: Diagnosis present

## 2014-08-02 DIAGNOSIS — K047 Periapical abscess without sinus: Secondary | ICD-10-CM | POA: Diagnosis present

## 2014-08-02 DIAGNOSIS — Z9889 Other specified postprocedural states: Secondary | ICD-10-CM | POA: Diagnosis not present

## 2014-08-02 DIAGNOSIS — Z833 Family history of diabetes mellitus: Secondary | ICD-10-CM | POA: Diagnosis not present

## 2014-08-02 DIAGNOSIS — Z7982 Long term (current) use of aspirin: Secondary | ICD-10-CM | POA: Diagnosis not present

## 2014-08-02 DIAGNOSIS — I10 Essential (primary) hypertension: Secondary | ICD-10-CM | POA: Diagnosis present

## 2014-08-02 DIAGNOSIS — E1165 Type 2 diabetes mellitus with hyperglycemia: Secondary | ICD-10-CM | POA: Diagnosis present

## 2014-08-02 DIAGNOSIS — S63391A Traumatic rupture of other ligament of right wrist, initial encounter: Secondary | ICD-10-CM | POA: Diagnosis present

## 2014-08-02 DIAGNOSIS — Y907 Blood alcohol level of 200-239 mg/100 ml: Secondary | ICD-10-CM | POA: Diagnosis present

## 2014-08-02 DIAGNOSIS — S62031A Displaced fracture of proximal third of navicular [scaphoid] bone of right wrist, initial encounter for closed fracture: Secondary | ICD-10-CM | POA: Diagnosis present

## 2014-08-02 DIAGNOSIS — S060X0A Concussion without loss of consciousness, initial encounter: Secondary | ICD-10-CM | POA: Diagnosis present

## 2014-08-02 DIAGNOSIS — E213 Hyperparathyroidism, unspecified: Secondary | ICD-10-CM | POA: Diagnosis present

## 2014-08-02 DIAGNOSIS — S32422A Displaced fracture of posterior wall of left acetabulum, initial encounter for closed fracture: Secondary | ICD-10-CM | POA: Diagnosis present

## 2014-08-02 DIAGNOSIS — Z79899 Other long term (current) drug therapy: Secondary | ICD-10-CM | POA: Diagnosis not present

## 2014-08-02 DIAGNOSIS — S52511A Displaced fracture of right radial styloid process, initial encounter for closed fracture: Secondary | ICD-10-CM | POA: Diagnosis present

## 2014-08-02 DIAGNOSIS — E669 Obesity, unspecified: Secondary | ICD-10-CM | POA: Diagnosis present

## 2014-08-02 LAB — URINE MICROSCOPIC-ADD ON

## 2014-08-02 LAB — CBC WITH DIFFERENTIAL/PLATELET
Basophils Absolute: 0 10*3/uL (ref 0.0–0.1)
Basophils Relative: 0 % (ref 0–1)
Eosinophils Absolute: 0 10*3/uL (ref 0.0–0.7)
Eosinophils Relative: 0 % (ref 0–5)
HCT: 39.5 % (ref 39.0–52.0)
Hemoglobin: 13.5 g/dL (ref 13.0–17.0)
Lymphocytes Relative: 11 % — ABNORMAL LOW (ref 12–46)
Lymphs Abs: 0.9 10*3/uL (ref 0.7–4.0)
MCH: 30.7 pg (ref 26.0–34.0)
MCHC: 34.2 g/dL (ref 30.0–36.0)
MCV: 89.8 fL (ref 78.0–100.0)
Monocytes Absolute: 0.6 10*3/uL (ref 0.1–1.0)
Monocytes Relative: 7 % (ref 3–12)
Neutro Abs: 6.3 10*3/uL (ref 1.7–7.7)
Neutrophils Relative %: 82 % — ABNORMAL HIGH (ref 43–77)
Platelets: 237 10*3/uL (ref 150–400)
RBC: 4.4 MIL/uL (ref 4.22–5.81)
RDW: 12.8 % (ref 11.5–15.5)
WBC: 7.7 10*3/uL (ref 4.0–10.5)

## 2014-08-02 LAB — APTT: aPTT: 23 seconds — ABNORMAL LOW (ref 24–37)

## 2014-08-02 LAB — COMPREHENSIVE METABOLIC PANEL
ALT: 55 U/L — ABNORMAL HIGH (ref 0–53)
AST: 109 U/L — ABNORMAL HIGH (ref 0–37)
Albumin: 3.5 g/dL (ref 3.5–5.2)
Alkaline Phosphatase: 48 U/L (ref 39–117)
Anion gap: 13 (ref 5–15)
BUN: 10 mg/dL (ref 6–23)
CO2: 22 mmol/L (ref 19–32)
Calcium: 8.6 mg/dL (ref 8.4–10.5)
Chloride: 102 mmol/L (ref 96–112)
Creatinine, Ser: 0.95 mg/dL (ref 0.50–1.35)
GFR calc Af Amer: >90 mL/min (ref >90)
GFR calc non Af Amer: >90 mL/min (ref >90)
Glucose, Bld: 196 mg/dL — ABNORMAL HIGH (ref 70–99)
Potassium: 4 mmol/L (ref 3.5–5.1)
Sodium: 137 mmol/L (ref 135–145)
Total Bilirubin: 0.8 mg/dL (ref 0.3–1.2)
Total Protein: 6.6 g/dL (ref 6.0–8.3)

## 2014-08-02 LAB — URINALYSIS, ROUTINE W REFLEX MICROSCOPIC
Bilirubin Urine: NEGATIVE
Glucose, UA: >1000 mg/dL — AB
Ketones, ur: 40 mg/dL — AB
Leukocytes, UA: NEGATIVE
Nitrite: NEGATIVE
Protein, ur: NEGATIVE mg/dL
Specific Gravity, Urine: 1.046 — ABNORMAL HIGH (ref 1.005–1.030)
Urobilinogen, UA: 0.2 mg/dL (ref 0.0–1.0)
pH: 5 (ref 5.0–8.0)

## 2014-08-02 LAB — GLUCOSE, CAPILLARY
Glucose-Capillary: 188 mg/dL — ABNORMAL HIGH (ref 70–99)
Glucose-Capillary: 227 mg/dL — ABNORMAL HIGH (ref 70–99)
Glucose-Capillary: 204 mg/dL — ABNORMAL HIGH (ref 70–99)

## 2014-08-02 LAB — RAPID URINE DRUG SCREEN, HOSP PERFORMED
Amphetamines: NOT DETECTED
Barbiturates: NOT DETECTED
Benzodiazepines: NOT DETECTED
Cocaine: NOT DETECTED
Opiates: NOT DETECTED
Tetrahydrocannabinol: NOT DETECTED

## 2014-08-02 LAB — PROTIME-INR
INR: 1.06 (ref 0.00–1.49)
Prothrombin Time: 14 seconds (ref 11.6–15.2)

## 2014-08-02 LAB — NO BLOOD PRODUCTS

## 2014-08-02 MED ORDER — HYDROCODONE-ACETAMINOPHEN 5-325 MG PO TABS
1.0000 | ORAL_TABLET | Freq: Four times a day (QID) | ORAL | Status: DC | PRN
  Administered 2014-08-02 – 2014-08-03 (×4): 2 via ORAL
  Filled 2014-08-02 (×4): qty 2

## 2014-08-02 MED ORDER — LISINOPRIL 10 MG PO TABS
10.0000 mg | ORAL_TABLET | Freq: Every day | ORAL | Status: DC
  Administered 2014-08-02 – 2014-08-14 (×13): 10 mg via ORAL
  Filled 2014-08-02 (×13): qty 1

## 2014-08-02 MED ORDER — METHOCARBAMOL 1000 MG/10ML IJ SOLN
500.0000 mg | Freq: Four times a day (QID) | INTRAVENOUS | Status: DC | PRN
  Filled 2014-08-02: qty 5

## 2014-08-02 MED ORDER — MORPHINE SULFATE 2 MG/ML IJ SOLN
1.0000 mg | Freq: Once | INTRAMUSCULAR | Status: AC
Start: 2014-08-02 — End: 2014-08-02
  Administered 2014-08-02: 1 mg via INTRAVENOUS

## 2014-08-02 MED ORDER — SODIUM CHLORIDE 0.9 % IV SOLN
INTRAVENOUS | Status: DC
  Administered 2014-08-02 – 2014-08-04 (×3): via INTRAVENOUS

## 2014-08-02 MED ORDER — METHOCARBAMOL 500 MG PO TABS
500.0000 mg | ORAL_TABLET | Freq: Four times a day (QID) | ORAL | Status: DC | PRN
  Administered 2014-08-02 – 2014-08-04 (×5): 500 mg via ORAL
  Filled 2014-08-02 (×5): qty 1

## 2014-08-02 MED ORDER — IOHEXOL 300 MG/ML  SOLN
100.0000 mL | Freq: Once | INTRAMUSCULAR | Status: AC | PRN
  Administered 2014-08-02: 100 mL via INTRAVENOUS

## 2014-08-02 MED ORDER — INSULIN ASPART 100 UNIT/ML ~~LOC~~ SOLN
0.0000 [IU] | Freq: Three times a day (TID) | SUBCUTANEOUS | Status: DC
  Administered 2014-08-02: 3 [IU] via SUBCUTANEOUS
  Administered 2014-08-02: 100 [IU] via SUBCUTANEOUS
  Administered 2014-08-03: 5 [IU] via SUBCUTANEOUS

## 2014-08-02 MED ORDER — SODIUM CHLORIDE 0.9 % IV SOLN: INTRAVENOUS | Status: DC

## 2014-08-02 MED ORDER — OXYCODONE HCL 5 MG PO TABS
5.0000 mg | ORAL_TABLET | ORAL | Status: DC | PRN
  Administered 2014-08-02: 10 mg via ORAL
  Filled 2014-08-02: qty 2

## 2014-08-02 MED ORDER — LIDOCAINE HCL (PF) 1 % IJ SOLN
INTRAMUSCULAR | Status: AC
  Administered 2014-08-02: 5 mL
  Filled 2014-08-02: qty 5

## 2014-08-02 MED ORDER — MORPHINE SULFATE 2 MG/ML IJ SOLN
0.5000 mg | INTRAMUSCULAR | Status: DC | PRN
  Filled 2014-08-02: qty 1

## 2014-08-02 MED ORDER — HYDROMORPHONE HCL 1 MG/ML IJ SOLN
1.0000 mg | INTRAMUSCULAR | Status: DC | PRN
Start: 2014-08-02 — End: 2014-08-02

## 2014-08-02 MED ORDER — ONDANSETRON HCL 4 MG/2ML IJ SOLN: 4.0000 mg | Freq: Three times a day (TID) | INTRAMUSCULAR | Status: DC | PRN

## 2014-08-02 MED ORDER — INSULIN ASPART 100 UNIT/ML ~~LOC~~ SOLN
0.0000 [IU] | Freq: Every day | SUBCUTANEOUS | Status: DC
  Administered 2014-08-02: 2 [IU] via SUBCUTANEOUS

## 2014-08-02 MED ORDER — METFORMIN HCL 500 MG PO TABS: 1000.0000 mg | ORAL_TABLET | Freq: Two times a day (BID) | ORAL | Status: DC

## 2014-08-02 MED ORDER — METFORMIN HCL 500 MG PO TABS
1000.0000 mg | ORAL_TABLET | Freq: Two times a day (BID) | ORAL | Status: DC
  Filled 2014-08-02: qty 2
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
## 2014-08-02 NOTE — Progress Notes (Signed)
Orthopedic Tech Progress Note Patient Details:  Katherine Bassetrick T Jarriel 09-14-1975 [id number]  Patient ID: Katherine BassetErick T Vinzant, male   DOB: 09-14-1975, 39 y.o.   MRN: [id number] As ordered by Dr. Gay FillerXu  Bettie Swavely, Mauricio PoWilliam Ray 08/02/2014, 7:35 AM
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
## 2014-08-02 NOTE — Progress Notes (Signed)
Orthopedic Tech Progress Note Patient Details:  Katherine Bassetrick T Lanum [birth year] [id number]  Musculoskeletal Traction Type of Traction: Skeletal (Balanced Suspension) Traction Location: Burna Mortimerlle    [place] Cawood Ray 08/02/2014, 2:58 AM

## 2014-08-02 NOTE — ED Notes (Signed)
EDP at bedside  
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
## 2014-08-02 NOTE — ED Notes (Signed)
Ortho paged. 

## 2014-08-02 NOTE — BH Assessment (Signed)
Received notification of TTS consult request. Spoke to Dr. Judd Lienelo who said does not need TTS consult at this time. TTS consult will be resubmitted if necessary.  Harlin RainFord Ellis Ria CommentWarrick Jr, LPC, [facility] Triage Specialist [phone]

## 2014-08-02 NOTE — Progress Notes (Signed)
Orthopedic Tech Progress Note Patient Details:  Katherine Bassetrick T Mcinerney [birth year] [id number]  Patient ID: Katherine BassetErick T Tubbs, male   DOB: [birth year], 39 y.o.   MRN: [id number] Traction weight for skeletal traction is 20 lbs to Burna Mortimerlle  Ananiah Maciolek Ray 08/02/2014, 7:36 AM

## 2014-08-02 NOTE — Progress Notes (Signed)
Orthopedic Tech Progress Note Patient Details:  Marcus [name] 01/18/1976 [id number]  Ortho Devices Type of Ortho Device: Ace wrap, Volar splint Ortho Device/Splint Location: RUE Ortho Device/Splint Interventions: Ordered, Application   Jennye MoccasinHughes, [name] 08/02/2014, 5:35 PM

## 2014-08-03 ENCOUNTER — Encounter (HOSPITAL_COMMUNITY): Payer: Self-pay | Admitting: Anesthesiology

## 2014-08-03 ENCOUNTER — Encounter (HOSPITAL_COMMUNITY)
Admission: EM | Disposition: A | Discharge status: Home Health | Payer: BLUE CROSS/BLUE SHIELD | Source: Home / Self Care

## 2014-08-03 ENCOUNTER — Inpatient Hospital Stay (HOSPITAL_COMMUNITY): Payer: BLUE CROSS/BLUE SHIELD | Admitting: Anesthesiology

## 2014-08-03 ENCOUNTER — Inpatient Hospital Stay (HOSPITAL_COMMUNITY): Payer: BLUE CROSS/BLUE SHIELD

## 2014-08-03 DIAGNOSIS — D62 Acute posthemorrhagic anemia: Secondary | ICD-10-CM | POA: Diagnosis not present

## 2014-08-03 DIAGNOSIS — S63309A Traumatic rupture of unspecified ligament of unspecified wrist, initial encounter: Secondary | ICD-10-CM | POA: Diagnosis present

## 2014-08-03 DIAGNOSIS — F10929 Alcohol use, unspecified with intoxication, unspecified: Secondary | ICD-10-CM | POA: Diagnosis present

## 2014-08-03 DIAGNOSIS — S2241XA Multiple fractures of ribs, right side, initial encounter for closed fracture: Secondary | ICD-10-CM | POA: Diagnosis present

## 2014-08-03 DIAGNOSIS — S060X9A Concussion with loss of consciousness of unspecified duration, initial encounter: Secondary | ICD-10-CM | POA: Diagnosis present

## 2014-08-03 DIAGNOSIS — S060XAA Concussion with loss of consciousness status unknown, initial encounter: Secondary | ICD-10-CM | POA: Diagnosis present

## 2014-08-03 DIAGNOSIS — S52501A Unspecified fracture of the lower end of right radius, initial encounter for closed fracture: Secondary | ICD-10-CM | POA: Diagnosis present

## 2014-08-03 HISTORY — PX: ORIF WRIST FRACTURE: SHX2133

## 2014-08-03 HISTORY — PX: ORIF ACETABULAR FRACTURE: SHX5029

## 2014-08-03 LAB — CBC
HCT: 37.5 % — ABNORMAL LOW (ref 39.0–52.0)
Hemoglobin: 12.6 g/dL — ABNORMAL LOW (ref 13.0–17.0)
MCH: 30.8 pg (ref 26.0–34.0)
MCHC: 33.6 g/dL (ref 30.0–36.0)
MCV: 91.7 fL (ref 78.0–100.0)
Platelets: 195 10*3/uL (ref 150–400)
RBC: 4.09 MIL/uL — ABNORMAL LOW (ref 4.22–5.81)
RDW: 12.7 % (ref 11.5–15.5)
WBC: 5.4 10*3/uL (ref 4.0–10.5)

## 2014-08-03 LAB — GLUCOSE, CAPILLARY
Glucose-Capillary: 218 mg/dL — ABNORMAL HIGH (ref 70–99)
Glucose-Capillary: 126 mg/dL — ABNORMAL HIGH (ref 70–99)
Glucose-Capillary: 134 mg/dL — ABNORMAL HIGH (ref 70–99)

## 2014-08-03 LAB — BASIC METABOLIC PANEL
Anion gap: 7 (ref 5–15)
BUN: 12 mg/dL (ref 6–23)
CO2: 27 mmol/L (ref 19–32)
Calcium: 8.2 mg/dL — ABNORMAL LOW (ref 8.4–10.5)
Chloride: 102 mmol/L (ref 96–112)
Creatinine, Ser: 0.94 mg/dL (ref 0.50–1.35)
GFR calc Af Amer: >90 mL/min (ref >90)
GFR calc non Af Amer: >90 mL/min (ref >90)
Glucose, Bld: 212 mg/dL — ABNORMAL HIGH (ref 70–99)
Potassium: 3.5 mmol/L (ref 3.5–5.1)
Sodium: 136 mmol/L (ref 135–145)

## 2014-08-03 LAB — VITAMIN D 25 HYDROXY (VIT D DEFICIENCY, FRACTURES): Vit D, 25-Hydroxy: 9.5 ng/mL — ABNORMAL LOW (ref 30.0–100.0)

## 2014-08-03 LAB — SURGICAL PCR SCREEN
MRSA, PCR: NEGATIVE
Staphylococcus aureus: NEGATIVE

## 2014-08-03 SURGERY — OPEN REDUCTION INTERNAL FIXATION (ORIF) ACETABULAR FRACTURE
Anesthesia: General | Site: Wrist | Laterality: Right

## 2014-08-03 MED ORDER — LACTATED RINGERS IV SOLN
INTRAVENOUS | Status: DC
  Administered 2014-08-03: 14:00:00 via INTRAVENOUS

## 2014-08-03 MED ORDER — FENTANYL CITRATE 0.05 MG/ML IJ SOLN
INTRAMUSCULAR | Status: AC
  Filled 2014-08-03: qty 5

## 2014-08-03 MED ORDER — PHENYLEPHRINE 40 MCG/ML (10ML) SYRINGE FOR IV PUSH (FOR BLOOD PRESSURE SUPPORT)
PREFILLED_SYRINGE | INTRAVENOUS | Status: AC
  Filled 2014-08-03: qty 10

## 2014-08-03 MED ORDER — SUCCINYLCHOLINE CHLORIDE 20 MG/ML IJ SOLN
INTRAMUSCULAR | Status: DC | PRN
  Administered 2014-08-03: 140 mg via INTRAVENOUS

## 2014-08-03 MED ORDER — LACTATED RINGERS IV SOLN
INTRAVENOUS | Status: DC | PRN
  Administered 2014-08-03 (×3): via INTRAVENOUS

## 2014-08-03 MED ORDER — HYDROCODONE-ACETAMINOPHEN 10-325 MG PO TABS
0.5000 | ORAL_TABLET | ORAL | Status: DC | PRN
  Administered 2014-08-03 – 2014-08-05 (×5): 2 via ORAL
  Filled 2014-08-03 (×5): qty 2

## 2014-08-03 MED ORDER — NEOSTIGMINE METHYLSULFATE 10 MG/10ML IV SOLN
INTRAVENOUS | Status: AC
  Filled 2014-08-03: qty 1

## 2014-08-03 MED ORDER — CEFAZOLIN SODIUM-DEXTROSE 2-3 GM-% IV SOLR
2.0000 g | INTRAVENOUS | Status: AC
  Administered 2014-08-03 – 2014-08-04 (×3): 2 g via INTRAVENOUS
  Filled 2014-08-03: qty 50

## 2014-08-03 MED ORDER — 0.9 % SODIUM CHLORIDE (POUR BTL) OPTIME
TOPICAL | Status: DC | PRN
  Administered 2014-08-03: 1000 mL

## 2014-08-03 MED ORDER — LIVING WELL WITH DIABETES BOOK
Freq: Once | Status: DC
  Filled 2014-08-03: qty 1

## 2014-08-03 MED ORDER — LACTATED RINGERS IV SOLN
INTRAVENOUS | Status: DC | PRN
  Administered 2014-08-03 – 2014-08-04 (×6): via INTRAVENOUS

## 2014-08-03 MED ORDER — DOCUSATE SODIUM 100 MG PO CAPS
100.0000 mg | ORAL_CAPSULE | Freq: Two times a day (BID) | ORAL | Status: DC
  Administered 2014-08-04 – 2014-08-14 (×19): 100 mg via ORAL
  Filled 2014-08-03 (×21): qty 1

## 2014-08-03 MED ORDER — FENTANYL CITRATE 0.05 MG/ML IJ SOLN
INTRAMUSCULAR | Status: DC | PRN
Start: 2014-08-03 — End: 2014-08-04
  Administered 2014-08-03 (×10): 50 ug via INTRAVENOUS
  Administered 2014-08-04: 100 ug via INTRAVENOUS

## 2014-08-03 MED ORDER — PROPOFOL 10 MG/ML IV BOLUS
INTRAVENOUS | Status: DC | PRN
  Administered 2014-08-03 (×3): 50 mg via INTRAVENOUS
  Administered 2014-08-03: 200 mg via INTRAVENOUS

## 2014-08-03 MED ORDER — ROCURONIUM BROMIDE 100 MG/10ML IV SOLN
INTRAVENOUS | Status: DC | PRN
  Administered 2014-08-03: 50 mg via INTRAVENOUS
  Administered 2014-08-03: 20 mg via INTRAVENOUS
  Administered 2014-08-03: 50 mg via INTRAVENOUS

## 2014-08-03 MED ORDER — PROPOFOL 10 MG/ML IV BOLUS
INTRAVENOUS | Status: AC
  Filled 2014-08-03: qty 20

## 2014-08-03 MED ORDER — CEFAZOLIN SODIUM 1-5 GM-% IV SOLN
INTRAVENOUS | Status: AC
  Filled 2014-08-03: qty 50

## 2014-08-03 MED ORDER — CEFAZOLIN SODIUM 1-5 GM-% IV SOLN
INTRAVENOUS | Status: DC | PRN
  Administered 2014-08-03: 1 g via INTRAVENOUS

## 2014-08-03 MED ORDER — MORPHINE SULFATE 4 MG/ML IJ SOLN
4.0000 mg | INTRAMUSCULAR | Status: DC | PRN
  Administered 2014-08-04 – 2014-08-05 (×5): 4 mg via INTRAVENOUS
  Filled 2014-08-03 (×5): qty 1

## 2014-08-03 MED ORDER — CEFAZOLIN SODIUM-DEXTROSE 2-3 GM-% IV SOLR
INTRAVENOUS | Status: AC
  Filled 2014-08-03: qty 50

## 2014-08-03 MED ORDER — MIDAZOLAM HCL 5 MG/5ML IJ SOLN
INTRAMUSCULAR | Status: DC | PRN
  Administered 2014-08-03: 2 mg via INTRAVENOUS

## 2014-08-03 MED ORDER — DIPHENHYDRAMINE HCL 12.5 MG/5ML PO ELIX
25.0000 mg | ORAL_SOLUTION | Freq: Four times a day (QID) | ORAL | Status: DC | PRN
  Administered 2014-08-03 – 2014-08-06 (×5): 25 mg via ORAL
  Filled 2014-08-03 (×5): qty 10

## 2014-08-03 MED ORDER — POLYETHYLENE GLYCOL 3350 17 G PO PACK
17.0000 g | PACK | Freq: Every day | ORAL | Status: DC
  Administered 2014-08-04 – 2014-08-14 (×10): 17 g via ORAL
  Filled 2014-08-03 (×10): qty 1

## 2014-08-03 MED ORDER — ONDANSETRON HCL 4 MG/2ML IJ SOLN
INTRAMUSCULAR | Status: AC
  Filled 2014-08-03: qty 2

## 2014-08-03 MED ORDER — DEXAMETHASONE SODIUM PHOSPHATE 4 MG/ML IJ SOLN
INTRAMUSCULAR | Status: AC
  Filled 2014-08-03: qty 1

## 2014-08-03 MED ORDER — GLYCOPYRROLATE 0.2 MG/ML IJ SOLN
INTRAMUSCULAR | Status: AC
  Filled 2014-08-03: qty 1

## 2014-08-03 MED ORDER — GLYCOPYRROLATE 0.2 MG/ML IJ SOLN
INTRAMUSCULAR | Status: AC
  Filled 2014-08-03: qty 3

## 2014-08-03 MED ORDER — METOCLOPRAMIDE HCL 5 MG/ML IJ SOLN
INTRAMUSCULAR | Status: AC
  Filled 2014-08-03: qty 2

## 2014-08-03 MED ORDER — LIDOCAINE HCL (CARDIAC) 20 MG/ML IV SOLN
INTRAVENOUS | Status: DC | PRN
  Administered 2014-08-03: 80 mg via INTRAVENOUS

## 2014-08-03 MED ORDER — LIDOCAINE HCL (CARDIAC) 20 MG/ML IV SOLN
INTRAVENOUS | Status: AC
  Filled 2014-08-03: qty 5

## 2014-08-03 MED ORDER — CHLORHEXIDINE GLUCONATE 4 % EX LIQD
60.0000 mL | Freq: Once | CUTANEOUS | Status: DC
  Filled 2014-08-03: qty 60

## 2014-08-03 MED ORDER — ROCURONIUM BROMIDE 50 MG/5ML IV SOLN
INTRAVENOUS | Status: AC
  Filled 2014-08-03: qty 1

## 2014-08-03 MED ORDER — SODIUM CHLORIDE 0.9 % IV SOLN
INTRAVENOUS | Status: DC | PRN
  Administered 2014-08-03: 20:00:00 via INTRAVENOUS

## 2014-08-03 MED ORDER — MIDAZOLAM HCL 2 MG/2ML IJ SOLN
INTRAMUSCULAR | Status: AC
  Filled 2014-08-03: qty 2

## 2014-08-03 MED ORDER — INSULIN ASPART 100 UNIT/ML ~~LOC~~ SOLN
0.0000 [IU] | Freq: Three times a day (TID) | SUBCUTANEOUS | Status: DC
  Administered 2014-08-03: 3 [IU] via SUBCUTANEOUS
  Administered 2014-08-04: 4 [IU] via SUBCUTANEOUS
  Administered 2014-08-04: 7 [IU] via SUBCUTANEOUS
  Administered 2014-08-04 – 2014-08-05 (×2): 4 [IU] via SUBCUTANEOUS
  Administered 2014-08-05 – 2014-08-06 (×3): 3 [IU] via SUBCUTANEOUS
  Administered 2014-08-06: 4 [IU] via SUBCUTANEOUS
  Administered 2014-08-06 – 2014-08-07 (×2): 3 [IU] via SUBCUTANEOUS
  Administered 2014-08-07 (×2): 4 [IU] via SUBCUTANEOUS
  Administered 2014-08-08 – 2014-08-12 (×7): 3 [IU] via SUBCUTANEOUS

## 2014-08-03 MED ORDER — PHENYLEPHRINE HCL 10 MG/ML IJ SOLN
10.0000 mg | INTRAVENOUS | Status: DC | PRN
  Administered 2014-08-03: 50 ug/min via INTRAVENOUS

## 2014-08-03 MED ORDER — INSULIN DETEMIR 100 UNIT/ML ~~LOC~~ SOLN
15.0000 [IU] | Freq: Every day | SUBCUTANEOUS | Status: DC
  Administered 2014-08-04: 15 [IU] via SUBCUTANEOUS
  Filled 2014-08-03 (×3): qty 0.15

## 2014-08-03 MED ORDER — PHENYLEPHRINE HCL 10 MG/ML IJ SOLN
INTRAMUSCULAR | Status: DC | PRN
  Administered 2014-08-03: 80 ug via INTRAVENOUS

## 2014-08-03 MED ORDER — SUCCINYLCHOLINE CHLORIDE 20 MG/ML IJ SOLN
INTRAMUSCULAR | Status: AC
  Filled 2014-08-03: qty 1

## 2014-08-03 SURGICAL SUPPLY — 107 items
ANCHOR FT CORKSCREW MICRO 2-0 (Anchor) ×12 IMPLANT
ANCHOR JUGGERKNOT 1.0 1DR 2-0 (Anchor) ×3 IMPLANT
ANCHOR JUGGERKNOT 1.0 3-0 NLD (Anchor) ×3 IMPLANT
APPLIER CLIP 11 MED OPEN (CLIP)
BANDAGE ELASTIC 4 VELCRO ST LF (GAUZE/BANDAGES/DRESSINGS) ×6 IMPLANT
BIT DRILL AO MATTA 2.5MX230M (BIT) ×2 IMPLANT
BIT DRILL TWST MATTA 3.5MX195M (BIT) ×2 IMPLANT
BLADE SURG ROTATE 9660 (MISCELLANEOUS) IMPLANT
BNDG ESMARK 4X9 LF (GAUZE/BANDAGES/DRESSINGS) ×3 IMPLANT
BNDG GAUZE ELAST 4 BULKY (GAUZE/BANDAGES/DRESSINGS) ×3 IMPLANT
BRUSH SCRUB DISP (MISCELLANEOUS) ×6 IMPLANT
CLIP APPLIE 11 MED OPEN (CLIP) IMPLANT
CORDS BIPOLAR (ELECTRODE) ×3 IMPLANT
COVER SURGICAL LIGHT HANDLE (MISCELLANEOUS) ×3 IMPLANT
CUFF TOURNIQUET SINGLE 18IN (TOURNIQUET CUFF) ×3 IMPLANT
DRAIN CHANNEL 10F 3/8 F FF (DRAIN) IMPLANT
DRAIN CHANNEL 15F RND FF W/TCR (WOUND CARE) IMPLANT
DRAPE C-ARM 42X72 X-RAY (DRAPES) ×3 IMPLANT
DRAPE C-ARMOR (DRAPES) ×3 IMPLANT
DRAPE IMP U-DRAPE 54X76 (DRAPES) ×3 IMPLANT
DRAPE INCISE IOBAN 66X45 STRL (DRAPES) IMPLANT
DRAPE INCISE IOBAN 85X60 (DRAPES) ×6 IMPLANT
DRAPE OEC MINIVIEW 54X84 (DRAPES) ×3 IMPLANT
DRAPE ORTHO SPLIT 77X108 STRL (DRAPES) ×2
DRAPE SURG ORHT 6 SPLT 77X108 (DRAPES) ×4 IMPLANT
DRAPE U-SHAPE 47X51 STRL (DRAPES) ×3 IMPLANT
DRILL BIT AO MATTA 2.5MX230M (BIT) ×3
DRILL TWIST AO MATTA 3.5MX195M (BIT) ×3
DRSG MEPILEX BORDER 4X12 (GAUZE/BANDAGES/DRESSINGS) ×3 IMPLANT
DRSG MEPILEX BORDER 4X8 (GAUZE/BANDAGES/DRESSINGS) IMPLANT
ELECT BLADE 6.5 EXT (BLADE) ×3 IMPLANT
ELECT REM PT RETURN 9FT ADLT (ELECTROSURGICAL) ×3
ELECTRODE REM PT RTRN 9FT ADLT (ELECTROSURGICAL) ×2 IMPLANT
EVACUATOR 1/8 PVC DRAIN (DRAIN) IMPLANT
EVACUATOR SILICONE 100CC (DRAIN) ×3 IMPLANT
GAUZE SPONGE 4X4 16PLY XRAY LF (GAUZE/BANDAGES/DRESSINGS) ×6 IMPLANT
GAUZE XEROFORM 5X9 LF (GAUZE/BANDAGES/DRESSINGS) ×3 IMPLANT
GLOVE BIO SURGEON STRL SZ7.5 (GLOVE) ×6 IMPLANT
GLOVE BIO SURGEON STRL SZ8 (GLOVE) ×6 IMPLANT
GLOVE BIOGEL PI IND STRL 6.5 (GLOVE) ×4 IMPLANT
GLOVE BIOGEL PI IND STRL 7.5 (GLOVE) ×4 IMPLANT
GLOVE BIOGEL PI IND STRL 8 (GLOVE) ×2 IMPLANT
GLOVE BIOGEL PI IND STRL 8.5 (GLOVE) ×4 IMPLANT
GLOVE BIOGEL PI INDICATOR 6.5 (GLOVE) ×2
GLOVE BIOGEL PI INDICATOR 7.5 (GLOVE) ×2
GLOVE BIOGEL PI INDICATOR 8 (GLOVE) ×1
GLOVE BIOGEL PI INDICATOR 8.5 (GLOVE) ×2
GOWN STRL REUS W/ TWL LRG LVL3 (GOWN DISPOSABLE) ×4 IMPLANT
GOWN STRL REUS W/ TWL XL LVL3 (GOWN DISPOSABLE) ×2 IMPLANT
GOWN STRL REUS W/TWL 2XL LVL3 (GOWN DISPOSABLE) ×3 IMPLANT
GOWN STRL REUS W/TWL LRG LVL3 (GOWN DISPOSABLE) ×2
GOWN STRL REUS W/TWL XL LVL3 (GOWN DISPOSABLE) ×1
HANDPIECE INTERPULSE COAX TIP (DISPOSABLE) ×1
K-WIRE .045X45 (WIRE) ×18 IMPLANT
K-WIRE DBL TROCAR .045X4 ×12 IMPLANT
KIT BASIN OR (CUSTOM PROCEDURE TRAY) ×6 IMPLANT
KIT ROOM TURNOVER OR (KITS) ×3 IMPLANT
KWIRE DBL TROCAR .045X4 ×8 IMPLANT
LIGHT ORTHO (MISCELLANEOUS) ×3 IMPLANT
LOOP VESSEL MAXI BLUE (MISCELLANEOUS) IMPLANT
MANIFOLD NEPTUNE II (INSTRUMENTS) ×3 IMPLANT
NEEDLE MAYO TROCAR (NEEDLE) ×3 IMPLANT
NS IRRIG 1000ML POUR BTL (IV SOLUTION) ×3 IMPLANT
PACK ORTHO EXTREMITY (CUSTOM PROCEDURE TRAY) ×3 IMPLANT
PACK TOTAL JOINT (CUSTOM PROCEDURE TRAY) ×3 IMPLANT
PACK UNIVERSAL I (CUSTOM PROCEDURE TRAY) ×3 IMPLANT
PAD ARMBOARD 7.5X6 YLW CONV (MISCELLANEOUS) ×6 IMPLANT
PADDING CAST ABS 3INX4YD NS (CAST SUPPLIES) ×1
PADDING CAST ABS 4INX4YD NS (CAST SUPPLIES) ×1
PADDING CAST ABS COTTON 3X4 (CAST SUPPLIES) ×2 IMPLANT
PADDING CAST ABS COTTON 4X4 ST (CAST SUPPLIES) ×2 IMPLANT
PLATE ACET STRT 94.5M 8H (Plate) ×6 IMPLANT
PLATE PROXIMAL ACULOC 2 WD RT (Plate) ×3 IMPLANT
RETRIEVER SUT HEWSON (MISCELLANEOUS) IMPLANT
SCREW CORT 48X3.5XST NS LF (Screw) ×2 IMPLANT
SCREW CORTEX ST MATTA 3.5X24 (Screw) ×6 IMPLANT
SCREW CORTEX ST MATTA 3.5X32MM (Screw) ×3 IMPLANT
SCREW CORTEX ST MATTA 3.5X34MM (Screw) ×3 IMPLANT
SCREW CORTEX ST MATTA 3.5X36MM (Screw) ×9 IMPLANT
SCREW CORTEX ST MATTA 3.5X38M (Screw) ×3 IMPLANT
SCREW CORTEX ST MATTA 3.5X40MM (Screw) ×9 IMPLANT
SCREW CORTICAL 3.5X42MM (Screw) ×3 IMPLANT
SCREW CORTICAL 3.5X48MM (Screw) ×1 IMPLANT
SET HNDPC FAN SPRY TIP SCT (DISPOSABLE) ×2 IMPLANT
SPLINT FIBERGLASS 3X35 (CAST SUPPLIES) ×3 IMPLANT
SPONGE GAUZE 4X4 12PLY STER LF (GAUZE/BANDAGES/DRESSINGS) ×3 IMPLANT
SPONGE LAP 18X18 X RAY DECT (DISPOSABLE) ×9 IMPLANT
STAPLER VISISTAT (STAPLE) ×3 IMPLANT
STAPLER VISISTAT 35W (STAPLE) ×3 IMPLANT
STRIP CLOSURE SKIN 1/2X4 (GAUZE/BANDAGES/DRESSINGS) ×3 IMPLANT
SUCTION FRAZIER TIP 10 FR DISP (SUCTIONS) ×6 IMPLANT
SUT FIBERWIRE #2 38 T-5 BLUE (SUTURE) ×6
SUT VIC AB 0 CT1 27 (SUTURE) ×2
SUT VIC AB 0 CT1 27XBRD ANBCTR (SUTURE) ×4 IMPLANT
SUT VIC AB 1 CT1 18XCR BRD 8 (SUTURE) IMPLANT
SUT VIC AB 1 CT1 8-18 (SUTURE)
SUT VIC AB 2-0 CT1 27 (SUTURE) ×1
SUT VIC AB 2-0 CT1 TAPERPNT 27 (SUTURE) ×2 IMPLANT
SUT VICRYL 4-0 PS2 18IN ABS (SUTURE) ×3 IMPLANT
SUTURE FIBERWR #2 38 T-5 BLUE (SUTURE) ×4 IMPLANT
TIP HIGH FLOW IRRIGATION COAX (MISCELLANEOUS) ×3 IMPLANT
TOWEL OR 17X24 6PK STRL BLUE (TOWEL DISPOSABLE) ×3 IMPLANT
TOWEL OR 17X26 10 PK STRL BLUE (TOWEL DISPOSABLE) ×15 IMPLANT
TOWEL OR 17X26 4PK STRL BLUE (TOWEL DISPOSABLE) ×3 IMPLANT
TRAY FOLEY CATH 16FRSI W/METER (SET/KITS/TRAYS/PACK) ×3 IMPLANT
TUBING BULK SUCTION (MISCELLANEOUS) ×3 IMPLANT
WATER STERILE IRR 1000ML POUR (IV SOLUTION) IMPLANT

## 2014-08-03 NOTE — Brief Op Note (Signed)
08/01/2014 - 08/03/2014  8:23 PM  PATIENT:  Marcus [name]  39 y.o. male  PRE-OPERATIVE DIAGNOSIS:   1. LEFT POSTERIOR WALL, POSTERIOR COLUMN ACETABULAR FRACTURE DISLOCATION 2. RIGHT PERILUNATE WRIST FRACTURE DISLOCATION  POST-OPERATIVE DIAGNOSIS: 1. LEFT POSTERIOR WALL, POSTERIOR COLUMN ACETABULAR FRACTURE DISLOCATION 2. RIGHT PERILUNATE WRIST FRACTURE DISLOCATION  PROCEDURE:  Procedure(s): 1. OPEN REDUCTION INTERNAL FIXATION (ORIF) ACETABULAR FRACTURE (Left) POSTERIOR WALL AND POSTERIOR COLUMN 2. OPEN REDUCTION OF RIGHT HIP TRAUMATIC DISLOCATION COMBINED WITH CAPSULAR AND LABRAL REPAIR 3. OPEN REDUCTION INTERNAL FIXATION (ORIF) RIGHT PERILUNATE WRIST FRACTURE DISLOCATION  SURGEON:  Surgeon(s) and Role: Panel 1:    * Myrene GalasMichael Kharlie Bring, MD - Primary  Panel 2:    * Bradly BienenstockFred Ortmann, MD - Primary  PHYSICIAN ASSISTANT: Montez MoritaKeith Paul, PA-C  ANESTHESIA:   general  I/O:  Total I/O In: 1200 [I.V.:1000; Blood:200] Out: Urine:255; Blood:500  SPECIMEN:  No Specimen  TOURNIQUET:  * No tourniquets in log *  DICTATION: .Other Dictation: Dictation Number [id number]

## 2014-08-03 NOTE — Anesthesia Preprocedure Evaluation (Addendum)
Anesthesia Evaluation  Patient identified by MRN, date of birth, ID band Patient awake    Reviewed: Allergy & Precautions, NPO status , Patient's Chart, lab work & pertinent test results  Airway Mallampati: II   Neck ROM: full    Dental   Pulmonary neg pulmonary ROS,          Cardiovascular hypertension,     Neuro/Psych    GI/Hepatic   Endo/Other  diabetes, Type 2obese  Renal/GU      Musculoskeletal   Abdominal   Peds  Hematology  (+) anemia , JEHOVAH'S WITNESSPt states that he will use cell saver.  He does not want any blood products to be given, even if it would be life saving.  Family is as bedside.   Anesthesia Other Findings   Reproductive/Obstetrics                            Anesthesia Physical Anesthesia Plan  ASA: III  Anesthesia Plan: General   Post-op Pain Management:    Induction: Intravenous  Airway Management Planned: Oral ETT  Additional Equipment:   Intra-op Plan:   Post-operative Plan: Extubation in OR  Informed Consent: I have reviewed the patients History and Physical, chart, labs and discussed the procedure including the risks, benefits and alternatives for the proposed anesthesia with the patient or authorized representative who has indicated his/her understanding and acceptance.     Plan Discussed with: CRNA, Anesthesiologist and Surgeon  Anesthesia Plan Comments:        Anesthesia Quick Evaluation

## 2014-08-03 NOTE — Progress Notes (Signed)
Dr. Carola FrostHandy will be taking over care for this patient's orthopedic injuries.
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
## 2014-08-03 NOTE — Progress Notes (Signed)
Inpatient Diabetes Program Recommendations  AACE/ADA: New Consensus Statement on Inpatient Glycemic Control (2013)  Target Ranges:  Prepandial:   less than 140 mg/dL      Peak postprandial:   less than 180 mg/dL (1-2 hours)      Critically ill patients:  140 - 180 mg/dL   Reason for Visit: Hyperglycemia  Diabetes history: DM2 Outpatient Diabetes medications: metformin 1000 mg bid Current orders for Inpatient glycemic control: Novolog resistant tidwc (On Hold while in OR)  39 year old male admitted with leg, hip and hand pain. Hx DM2 on OHAs at home. At present, in [place] for ORIF.   Results for Marcus [name], Marcus [name] (MRN [id number]) as of 08/03/2014 13:58  Ref. Range 08/02/2014 16:40 08/02/2014 22:31 08/03/2014 06:56 08/03/2014 11:48 08/03/2014 13:54  Glucose-Capillary Latest Range: 70-99 mg/dL 227 (H) 204 (H) 218 (H) 126 (H) 134 (H)      Results for Marcus [name], Marcus [name] (MRN [id number]) as of 08/03/2014 13:58  Ref. Range 06/15/2014 15:21  Hemoglobin A1C No range found 15.0   Uncontrolled DM prior to admission. Likely will need insulin at home for improved glycemic control.  Recommendations: Add Levemir 15 units QHS Add HS correction Add Novolog 3 units tidwc for meal coverage insulin if CBGs consistently > 180 mg/dL  Will order Living Well With Diabetes book. If pt is to go home on insulin, will order insulin starter kit and RN to begin teaching insulin administration. Would benefit from OP Diabetes Education consult for uncontrolled DM. Will order. Will f/u with pt in am regarding his diabetes control. Thank you. Lorenda Peck, RD, LDN, CDE Inpatient Diabetes Coordinator [phone]

## 2014-08-03 NOTE — Progress Notes (Signed)
UR completed.  Arliss Hepburn, RN BSN [facility] CCM Trauma/Neuro ICU Case Manager [phone]  
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
## 2014-08-03 NOTE — Consult Note (Signed)
PT SEEN/EXAMINED FOR FIRST TIME IN OR I DISCUSSED FINDINGS OF RIGHT WRIST WITH FAMILY IN WAITING ROOM WILL PROCEED WITH RIGHT WRIST RECONSTRUCTION FOR PERILUNATE EQUIVALENT FRACTURE DISLOCATION WE ARE PLANNING SURGERY FOR YOUR UPPER EXTREMITY. THE RISKS AND BENEFITS OF SURGERY INCLUDE BUT NOT LIMITED TO BLEEDING INFECTION, DAMAGE TO NEARBY NERVES ARTERIES TENDONS, FAILURE OF SURGERY TO ACCOMPLISH ITS INTENDED GOALS, PERSISTENT SYMPTOMS AND NEED FOR FURTHER SURGICAL INTERVENTION. WITH THIS IN MIND WE WILL PROCEED. I HAVE DISCUSSED WITH THE PATIENT THE PRE AND POSTOPERATIVE REGIMEN AND THE DOS AND DON'TS. PT VOICED UNDERSTANDING AND INFORMED CONSENT SIGNED.

## 2014-08-04 ENCOUNTER — Inpatient Hospital Stay (HOSPITAL_COMMUNITY): Payer: BLUE CROSS/BLUE SHIELD

## 2014-08-04 ENCOUNTER — Telehealth: Payer: Self-pay | Admitting: Oncology

## 2014-08-04 ENCOUNTER — Encounter (HOSPITAL_COMMUNITY): Payer: Self-pay | Admitting: Orthopedic Surgery

## 2014-08-04 LAB — COMPREHENSIVE METABOLIC PANEL
ALT: 113 U/L — ABNORMAL HIGH (ref 0–53)
AST: 497 U/L — ABNORMAL HIGH (ref 0–37)
Albumin: 2.4 g/dL — ABNORMAL LOW (ref 3.5–5.2)
Alkaline Phosphatase: 36 U/L — ABNORMAL LOW (ref 39–117)
Anion gap: 9 (ref 5–15)
BUN: 9 mg/dL (ref 6–23)
CO2: 25 mmol/L (ref 19–32)
Calcium: 7.8 mg/dL — ABNORMAL LOW (ref 8.4–10.5)
Chloride: 101 mmol/L (ref 96–112)
Creatinine, Ser: 1.24 mg/dL (ref 0.50–1.35)
GFR calc Af Amer: 83 mL/min — ABNORMAL LOW (ref >90)
GFR calc non Af Amer: 72 mL/min — ABNORMAL LOW (ref >90)
Glucose, Bld: 172 mg/dL — ABNORMAL HIGH (ref 70–99)
Potassium: 3.9 mmol/L (ref 3.5–5.1)
Sodium: 135 mmol/L (ref 135–145)
Total Bilirubin: 0.7 mg/dL (ref 0.3–1.2)
Total Protein: 5.5 g/dL — ABNORMAL LOW (ref 6.0–8.3)

## 2014-08-04 LAB — CALCIUM, IONIZED: Calcium, Ion: 1.09 mmol/L — ABNORMAL LOW (ref 1.12–1.32)

## 2014-08-04 LAB — GLUCOSE, CAPILLARY
Glucose-Capillary: 186 mg/dL — ABNORMAL HIGH (ref 70–99)
Glucose-Capillary: 209 mg/dL — ABNORMAL HIGH (ref 70–99)
Glucose-Capillary: 152 mg/dL — ABNORMAL HIGH (ref 70–99)
Glucose-Capillary: 152 mg/dL — ABNORMAL HIGH (ref 70–99)
Glucose-Capillary: 142 mg/dL — ABNORMAL HIGH (ref 70–99)

## 2014-08-04 LAB — CBC
HCT: 32.6 % — ABNORMAL LOW (ref 39.0–52.0)
Hemoglobin: 10.9 g/dL — ABNORMAL LOW (ref 13.0–17.0)
MCH: 30.4 pg (ref 26.0–34.0)
MCHC: 33.4 g/dL (ref 30.0–36.0)
MCV: 91.1 fL (ref 78.0–100.0)
Platelets: 152 10*3/uL (ref 150–400)
RBC: 3.58 MIL/uL — ABNORMAL LOW (ref 4.22–5.81)
RDW: 12.7 % (ref 11.5–15.5)
WBC: 8.1 10*3/uL (ref 4.0–10.5)

## 2014-08-04 LAB — POCT I-STAT 4, (NA,K, GLUC, HGB,HCT)
Glucose, Bld: 95 mg/dL (ref 70–99)
Glucose, Bld: 187 mg/dL — ABNORMAL HIGH (ref 70–99)
HCT: 12 % — ABNORMAL LOW (ref 39.0–52.0)
HCT: 31 % — ABNORMAL LOW (ref 39.0–52.0)
Hemoglobin: 4.1 g/dL — CL (ref 13.0–17.0)
Hemoglobin: 10.5 g/dL — ABNORMAL LOW (ref 13.0–17.0)
Potassium: 4 mmol/L (ref 3.5–5.1)
Potassium: 4.1 mmol/L (ref 3.5–5.1)
Sodium: 132 mmol/L — ABNORMAL LOW (ref 135–145)
Sodium: 135 mmol/L (ref 135–145)

## 2014-08-04 LAB — TSH: TSH: 1.104 u[IU]/mL (ref 0.350–4.500)

## 2014-08-04 LAB — MAGNESIUM: Magnesium: 1.5 mg/dL (ref 1.5–2.5)

## 2014-08-04 LAB — PHOSPHORUS: Phosphorus: 2.8 mg/dL (ref 2.3–4.6)

## 2014-08-04 MED ORDER — ADULT MULTIVITAMIN W/MINERALS CH
1.0000 | ORAL_TABLET | Freq: Every day | ORAL | Status: DC
  Administered 2014-08-04 – 2014-08-14 (×11): 1 via ORAL
  Filled 2014-08-04 (×11): qty 1

## 2014-08-04 MED ORDER — VITAMIN D (ERGOCALCIFEROL) 1.25 MG (50000 UNIT) PO CAPS
50000.0000 [IU] | ORAL_CAPSULE | ORAL | Status: DC
  Administered 2014-08-04 – 2014-08-11 (×2): 50000 [IU] via ORAL
  Filled 2014-08-04 (×2): qty 1

## 2014-08-04 MED ORDER — GLYCOPYRROLATE 0.2 MG/ML IJ SOLN
INTRAMUSCULAR | Status: DC | PRN
  Administered 2014-08-04: 0.6 mg via INTRAVENOUS

## 2014-08-04 MED ORDER — HYDROMORPHONE HCL 1 MG/ML IJ SOLN
INTRAMUSCULAR | Status: AC
  Filled 2014-08-04: qty 1

## 2014-08-04 MED ORDER — ONDANSETRON HCL 4 MG/2ML IJ SOLN: 4.0000 mg | Freq: Four times a day (QID) | INTRAMUSCULAR | Status: DC | PRN

## 2014-08-04 MED ORDER — VITAMIN C 500 MG PO TABS
1000.0000 mg | ORAL_TABLET | Freq: Every day | ORAL | Status: DC
  Administered 2014-08-04 – 2014-08-14 (×11): 1000 mg via ORAL
  Filled 2014-08-04 (×11): qty 2

## 2014-08-04 MED ORDER — CEFAZOLIN SODIUM-DEXTROSE 2-3 GM-% IV SOLR
2.0000 g | Freq: Three times a day (TID) | INTRAVENOUS | Status: AC
  Administered 2014-08-04 (×3): 2 g via INTRAVENOUS
  Filled 2014-08-04 (×3): qty 50

## 2014-08-04 MED ORDER — FENTANYL CITRATE 0.05 MG/ML IJ SOLN
INTRAMUSCULAR | Status: AC
  Filled 2014-08-04: qty 5

## 2014-08-04 MED ORDER — OXYCODONE HCL 5 MG/5ML PO SOLN: 5.0000 mg | Freq: Once | ORAL | Status: AC | PRN

## 2014-08-04 MED ORDER — ENOXAPARIN SODIUM 30 MG/0.3ML ~~LOC~~ SOLN
30.0000 mg | Freq: Two times a day (BID) | SUBCUTANEOUS | Status: DC
  Administered 2014-08-04 – 2014-08-09 (×12): 30 mg via SUBCUTANEOUS
  Filled 2014-08-04 (×12): qty 0.3

## 2014-08-04 MED ORDER — CEFAZOLIN SODIUM 1-5 GM-% IV SOLN
1.0000 g | Freq: Three times a day (TID) | INTRAVENOUS | Status: DC
  Filled 2014-08-04 (×2): qty 50

## 2014-08-04 MED ORDER — METFORMIN HCL 500 MG PO TABS
1000.0000 mg | ORAL_TABLET | Freq: Two times a day (BID) | ORAL | Status: DC
  Administered 2014-08-04 – 2014-08-05 (×3): 1000 mg via ORAL
  Filled 2014-08-04 (×3): qty 2

## 2014-08-04 MED ORDER — OXYCODONE HCL 5 MG PO TABS
5.0000 mg | ORAL_TABLET | Freq: Once | ORAL | Status: AC | PRN
  Administered 2014-08-04: 5 mg via ORAL

## 2014-08-04 MED ORDER — ACETAMINOPHEN 325 MG PO TABS
325.0000 mg | ORAL_TABLET | Freq: Four times a day (QID) | ORAL | Status: DC | PRN
  Administered 2014-08-04: 325 mg via ORAL
  Administered 2014-08-05 – 2014-08-06 (×3): 650 mg via ORAL
  Administered 2014-08-06 – 2014-08-08 (×6): 325 mg via ORAL
  Filled 2014-08-04 (×10): qty 1

## 2014-08-04 MED ORDER — HYDROMORPHONE HCL 1 MG/ML IJ SOLN
0.2500 mg | INTRAMUSCULAR | Status: DC | PRN
  Administered 2014-08-04 (×3): 0.5 mg via INTRAVENOUS

## 2014-08-04 MED ORDER — ONDANSETRON HCL 4 MG/2ML IJ SOLN
INTRAMUSCULAR | Status: DC | PRN
  Administered 2014-08-04: 4 mg via INTRAVENOUS

## 2014-08-04 MED ORDER — NEOSTIGMINE METHYLSULFATE 10 MG/10ML IV SOLN
INTRAVENOUS | Status: DC | PRN
  Administered 2014-08-04: 3 mg via INTRAVENOUS

## 2014-08-04 MED ORDER — OXYCODONE HCL 5 MG PO TABS
ORAL_TABLET | ORAL | Status: AC
  Filled 2014-08-04: qty 1

## 2014-08-04 MED ORDER — CEFAZOLIN SODIUM-DEXTROSE 2-3 GM-% IV SOLR
INTRAVENOUS | Status: AC
  Filled 2014-08-04: qty 50

## 2014-08-04 MED ORDER — VITAMIN D 1000 UNITS PO TABS
1000.0000 [IU] | ORAL_TABLET | Freq: Two times a day (BID) | ORAL | Status: DC
  Administered 2014-08-04 – 2014-08-14 (×21): 1000 [IU] via ORAL
  Filled 2014-08-04 (×22): qty 1

## 2014-08-04 MED FILL — Heparin Sodium (Porcine) Inj 1000 Unit/ML: INTRAMUSCULAR | Qty: 30 | Status: AC

## 2014-08-04 MED FILL — Sodium Chloride IV Soln 0.9%: INTRAVENOUS | Qty: 2000 | Status: AC

## 2014-08-04 NOTE — Progress Notes (Signed)
CSW attempted to complete assessment and SBIRT with pt this pm, but pt requested that CSW come back tomorrow.  Pt stated that he was sleepy and was just given a bath and pain meds.  CSW will visit pt again on 3/23.

## 2014-08-04 NOTE — Transfer of Care (Signed)
Immediate Anesthesia Transfer of Care Note  Patient: Katherine Bassetrick T Chaffin  Procedure(s) Performed: Procedure(s): OPEN REDUCTION INTERNAL FIXATION (ORIF) ACETABULAR FRACTURE (Left) OPEN REDUCTION INTERNAL FIXATION (ORIF) WRIST FRACTURE (Right)  Patient Location: PACU  Anesthesia Type:General  Level of Consciousness: awake, sedated and patient cooperative  Airway & Oxygen Therapy: Patient connected to face mask oxygen  Post-op Assessment: Report given to RN, Post -op Vital signs reviewed and stable and Patient moving all extremities X 4  Post vital signs: Reviewed and stable  Last Vitals:  Filed Vitals:   08/03/14 1300  BP: 162/102  Pulse: 108  Temp: 37.8 C  Resp: 18    Complications: No apparent anesthesia complications

## 2014-08-04 NOTE — Progress Notes (Signed)
Orthopedic Tech Progress Note Patient Details:  Marcus [name] [birth year] [id number] Prafo applied to LLE Ortho Devices Type of Ortho Device: Postop shoe/boot Ortho Device/Splint Location: LLE Prafo Ortho Device/Splint Interventions: Application   Asia R Thompson 08/04/2014, 2:36 PM

## 2014-08-04 NOTE — Progress Notes (Signed)
POST OP PLAN NWB RUE OT FOR DIGITAL ROM AND EDEMA CONTROL CONTINUE WITH SPLINT  DO NOT REMOVE WILL NEED TO F/U WITH ME IN 14 DAYS CONTACT ME IF ANY QUESTIONS [phone]
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
## 2014-08-04 NOTE — Telephone Encounter (Signed)
Larae Groomsalled Dee, RN on [facility] to have [facility] arranged for Mr. Baron Hamperrotter to be at the cancer center at 11:30 tomorrow.  Dee verbalized agreement and will arrange [facility].
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
## 2014-08-04 NOTE — Anesthesia Postprocedure Evaluation (Signed)
  Anesthesia Post-op Note  Patient: Katherine BassetErick T Nehme  Procedure(s) Performed: Procedure(s): OPEN REDUCTION INTERNAL FIXATION (ORIF) ACETABULAR FRACTURE (Left) OPEN REDUCTION INTERNAL FIXATION (ORIF) WRIST FRACTURE (Right)  Patient Location: PACU  Anesthesia Type:General  Level of Consciousness: awake and alert   Airway and Oxygen Therapy: Patient Spontanous Breathing  Post-op Pain: moderate  Post-op Assessment: Post-op Vital signs reviewed, Patient's Cardiovascular Status Stable and Respiratory Function Stable  Post-op Vital Signs: Reviewed  Filed Vitals:   08/04/14 0300  BP: 132/77  Pulse: 110  Temp:   Resp: 18    Complications: No apparent anesthesia complications
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
## 2014-08-04 NOTE — Progress Notes (Signed)
Results for Marcus BassetROTTER, Marcus [name] (MRN [id number]) as of 08/04/2014 16:44  Ref. Range 08/03/2014 11:48 08/03/2014 13:54 08/04/2014 02:13 08/04/2014 06:28 08/04/2014 12:59  Glucose-Capillary Latest Range: 70-99 mg/dL 366126 (H) 440134 (H) 347186 (H) 209 (H) 152 (H)   Blood sugars much improved. Discussed going home on insulin with pt and wife. Pt states his PCP had recommended insulin, but he hadn'[name] scheduled a f/u visit to begin insulin administration. Discussed HgbA1C of 15%. Long discussion regarding importance of glycemic control to prevent long-term complications. Pt will need glucose meter and supplies for home. Discussed making healthy food choices and watching portion sizes. Will f/u tomorrow am to continue discussion on glucose control.  Thank you. Ailene Ardshonda Demico Ploch, RD, LDN, CDE Inpatient Diabetes Coordinator [phone]
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
## 2014-08-04 NOTE — Brief Op Note (Signed)
08/01/2014 - 08/04/2014  1:45 AM  PATIENT:  Marcus BassetErick T [name]  39 y.o. male  PRE-OPERATIVE DIAGNOSIS:  Left Acetabular Fx. Dislocation Right Wrist Fracture  POST-OPERATIVE DIAGNOSIS:  same  PROCEDURE:  orif right wrist reconstruction  SURGEON:  Surgeon(s) and Role: Panel 1:    * Myrene GalasMichael Handy, MD - Primary  Panel 2:    * Bradly BienenstockFred Levern Kalka, MD - Primary  PHYSICIAN ASSISTANT:   ASSISTANTS: none   ANESTHESIA:   general  EBL:  Total I/O In: 7600 [I.V.:7400; Blood:200] Out: 180 [Urine:80; Blood:100]  BLOOD ADMINISTERED:none  DRAINS: none   LOCAL MEDICATIONS USED:  NONE  SPECIMEN:  No Specimen  DISPOSITION OF SPECIMEN:  N/A  COUNTS:  YES  TOURNIQUET:   Total Tourniquet Time Documented: Upper Arm (Right) - 108 minutes Upper Arm (Right) - 72 minutes Total: Upper Arm (Right) - 180 minutes   DICTATION: .Other Dictation: Dictation Number [id number]  PLAN OF CARE: Admit to inpatient   PATIENT DISPOSITION:  PACU - hemodynamically stable.   Delay start of Pharmacological VTE agent (>24hrs) due to surgical blood loss or risk of bleeding: not applicable

## 2014-08-04 NOTE — Progress Notes (Signed)
Orthopedic Tech Progress Note Patient Details:  Marcus [name] 1975/09/29 [id number] Patient unable to use OHF Patient ID: Marcus [name], male   DOB: 1975/09/29, 39 y.o.   MRN: [id number]   Marcus [name] 08/04/2014, 2:36 PM
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
## 2014-08-05 ENCOUNTER — Telehealth: Payer: Self-pay | Admitting: Oncology

## 2014-08-05 ENCOUNTER — Inpatient Hospital Stay (HOSPITAL_COMMUNITY): Payer: BLUE CROSS/BLUE SHIELD

## 2014-08-05 ENCOUNTER — Encounter (HOSPITAL_COMMUNITY): Payer: Self-pay | Admitting: Orthopedic Surgery

## 2014-08-05 ENCOUNTER — Ambulatory Visit
Admit: 2014-08-05 | Discharge: 2014-08-05 | Disposition: A | Discharge status: Home / Self Care | Payer: No Typology Code available for payment source | Attending: Radiation Oncology | Admitting: Radiation Oncology

## 2014-08-05 ENCOUNTER — Institutional Professional Consult (permissible substitution): Payer: Self-pay | Admitting: Radiation Oncology

## 2014-08-05 ENCOUNTER — Telehealth: Payer: Self-pay | Admitting: Family Medicine

## 2014-08-05 VITALS — BP 153/104 | HR 123 | Temp 100.6°F | Resp 20

## 2014-08-05 DIAGNOSIS — E349 Endocrine disorder, unspecified: Secondary | ICD-10-CM

## 2014-08-05 DIAGNOSIS — S32402S Unspecified fracture of left acetabulum, sequela: Secondary | ICD-10-CM

## 2014-08-05 DIAGNOSIS — E559 Vitamin D deficiency, unspecified: Secondary | ICD-10-CM

## 2014-08-05 DIAGNOSIS — E291 Testicular hypofunction: Secondary | ICD-10-CM

## 2014-08-05 HISTORY — DX: Testicular hypofunction: E29.1

## 2014-08-05 HISTORY — DX: Vitamin D deficiency, unspecified: E55.9

## 2014-08-05 LAB — PTH, INTACT AND CALCIUM
Calcium, Total (PTH): 7.4 mg/dL — ABNORMAL LOW (ref 8.7–10.2)
PTH: 103 pg/mL — ABNORMAL HIGH (ref 15–65)

## 2014-08-05 LAB — VITAMIN D 25 HYDROXY (VIT D DEFICIENCY, FRACTURES): Vit D, 25-Hydroxy: 8 ng/mL — ABNORMAL LOW (ref 30.0–100.0)

## 2014-08-05 LAB — HEMOGLOBIN A1C
Hgb A1c MFr Bld: 11 % — ABNORMAL HIGH (ref 4.8–5.6)
Mean Plasma Glucose: 269 mg/dL

## 2014-08-05 LAB — CBC
HCT: 29.3 % — ABNORMAL LOW (ref 39.0–52.0)
Hemoglobin: 9.8 g/dL — ABNORMAL LOW (ref 13.0–17.0)
MCH: 30.3 pg (ref 26.0–34.0)
MCHC: 33.4 g/dL (ref 30.0–36.0)
MCV: 90.7 fL (ref 78.0–100.0)
Platelets: 160 10*3/uL (ref 150–400)
RBC: 3.23 MIL/uL — ABNORMAL LOW (ref 4.22–5.81)
RDW: 12.4 % (ref 11.5–15.5)
WBC: 9.5 10*3/uL (ref 4.0–10.5)

## 2014-08-05 LAB — TRANSFERRIN: Transferrin: 184 mg/dL — ABNORMAL LOW (ref 200–370)

## 2014-08-05 LAB — GLUCOSE, CAPILLARY
Glucose-Capillary: 126 mg/dL — ABNORMAL HIGH (ref 70–99)
Glucose-Capillary: 125 mg/dL — ABNORMAL HIGH (ref 70–99)
Glucose-Capillary: 185 mg/dL — ABNORMAL HIGH (ref 70–99)
Glucose-Capillary: 162 mg/dL — ABNORMAL HIGH (ref 70–99)

## 2014-08-05 MED ORDER — INSULIN DETEMIR 100 UNIT/ML ~~LOC~~ SOLN
20.0000 [IU] | Freq: Every day | SUBCUTANEOUS | Status: DC
  Administered 2014-08-05: 20 [IU] via SUBCUTANEOUS
  Filled 2014-08-05 (×2): qty 0.2

## 2014-08-05 MED ORDER — WARFARIN - PHARMACIST DOSING INPATIENT
Freq: Every day | Status: DC
Start: 2014-08-05 — End: 2014-08-14
  Administered 2014-08-11 – 2014-08-14 (×2): 1

## 2014-08-05 MED ORDER — TIZANIDINE HCL 4 MG PO TABS
4.0000 mg | ORAL_TABLET | Freq: Three times a day (TID) | ORAL | Status: DC | PRN
  Administered 2014-08-05 – 2014-08-14 (×15): 4 mg via ORAL
  Filled 2014-08-05 (×16): qty 1

## 2014-08-05 MED ORDER — IOHEXOL 350 MG/ML SOLN
100.0000 mL | Freq: Once | INTRAVENOUS | Status: AC | PRN
  Administered 2014-08-05: 80 mL via INTRAVENOUS

## 2014-08-05 MED ORDER — WARFARIN SODIUM 5 MG PO TABS
10.0000 mg | ORAL_TABLET | Freq: Once | ORAL | Status: AC
  Administered 2014-08-05: 10 mg via ORAL
  Filled 2014-08-05: qty 2

## 2014-08-05 MED ORDER — COUMADIN BOOK
Freq: Once | Status: DC
  Filled 2014-08-05: qty 1

## 2014-08-05 MED ORDER — TRAMADOL HCL 50 MG PO TABS
100.0000 mg | ORAL_TABLET | Freq: Four times a day (QID) | ORAL | Status: DC
  Administered 2014-08-05 – 2014-08-14 (×34): 100 mg via ORAL
  Filled 2014-08-05 (×39): qty 2

## 2014-08-05 MED ORDER — WARFARIN VIDEO
Freq: Once | Status: AC
  Administered 2014-08-05: 18:00:00

## 2014-08-05 MED ORDER — OXYCODONE HCL 5 MG PO TABS
10.0000 mg | ORAL_TABLET | ORAL | Status: DC | PRN
  Administered 2014-08-05 (×2): 20 mg via ORAL
  Administered 2014-08-06: 15 mg via ORAL
  Administered 2014-08-07: 20 mg via ORAL
  Administered 2014-08-07 (×3): 15 mg via ORAL
  Administered 2014-08-08 – 2014-08-09 (×3): 20 mg via ORAL
  Administered 2014-08-10: 10 mg via ORAL
  Administered 2014-08-10 – 2014-08-11 (×5): 20 mg via ORAL
  Administered 2014-08-12 – 2014-08-13 (×6): 15 mg via ORAL
  Filled 2014-08-05 (×2): qty 4
  Filled 2014-08-05 (×2): qty 3
  Filled 2014-08-05: qty 4
  Filled 2014-08-05: qty 3
  Filled 2014-08-05: qty 4
  Filled 2014-08-05 (×3): qty 3
  Filled 2014-08-05 (×2): qty 4
  Filled 2014-08-05 (×2): qty 3
  Filled 2014-08-05 (×2): qty 4
  Filled 2014-08-05: qty 2
  Filled 2014-08-05: qty 4
  Filled 2014-08-05 (×2): qty 3
  Filled 2014-08-05 (×2): qty 4
  Filled 2014-08-05: qty 3
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
## 2014-08-05 NOTE — Progress Notes (Signed)
SLP Cancellation Note  Patient Details Name: Marcus [name] MRN: [id number] DOB: 29-Dec-1975   Cancelled treatment:       Reason Eval/Treat Not Completed: Patient at procedure or test/unavailable.  SLP will follow up as able.   Marcus [name], M.A., Marcus [name] [phone]  Marcus [name] 08/05/2014, 1:35 PM
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
## 2014-08-05 NOTE — Progress Notes (Signed)
[facility] Radiation Oncology Dept Therapy Treatment Record Phone [phone]   Radiation Therapy was administered to Marcus [name] on: 08/05/2014  1:15 PM and was treatment # 1 out of a planned course of 1 treatments.
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
## 2014-08-05 NOTE — Progress Notes (Signed)
OT Cancellation Note  Patient Details Name: Marcus [name] MRN: [id number] DOB: [birth year]   Cancelled Treatment:    Reason Eval/Treat Not Completed: Medical issues which prohibited therapy;Patient at procedure or test/ unavailable. D-dimer and CT chest to r/o PE. OT to follow-up for eval/treat as able and appropriate.    Jeannia Tatro , MS, OTR/L, CLT Pager: [phone]  08/05/2014, 1:58 PM

## 2014-08-05 NOTE — Progress Notes (Signed)
Called [facility] spoke with Doug at 1:30pm patient is [facility] to [facility], he is at nursing ground floor radiation oncology dept in the cancer center.doug gave verbal readback

## 2014-08-05 NOTE — Progress Notes (Signed)
PT Cancellation Note  Patient Details Name: Marcus [name] MRN: [id number] DOB: [birth year]   Cancelled Treatment:    Reason Eval/Treat Not Completed: Patient not medically ready (d-dimer and CT chest to r/o PE).  PT will see pt again once medically ready.  Michail JewelsAshley Parr PT, DPT [phone] 08/05/2014, 10:13 AM
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
## 2014-08-06 DIAGNOSIS — M7989 Other specified soft tissue disorders: Secondary | ICD-10-CM

## 2014-08-06 DIAGNOSIS — Z9889 Other specified postprocedural states: Secondary | ICD-10-CM

## 2014-08-06 LAB — URINALYSIS, ROUTINE W REFLEX MICROSCOPIC
Bilirubin Urine: NEGATIVE
Glucose, UA: 250 mg/dL — AB
Ketones, ur: 15 mg/dL — AB
Leukocytes, UA: NEGATIVE
Nitrite: NEGATIVE
Protein, ur: 100 mg/dL — AB
Specific Gravity, Urine: 1.025 (ref 1.005–1.030)
Urobilinogen, UA: 1 mg/dL (ref 0.0–1.0)
pH: 6 (ref 5.0–8.0)

## 2014-08-06 LAB — COMPREHENSIVE METABOLIC PANEL
ALT: 166 U/L — ABNORMAL HIGH (ref 0–53)
AST: 723 U/L — ABNORMAL HIGH (ref 0–37)
Albumin: 2.1 g/dL — ABNORMAL LOW (ref 3.5–5.2)
Alkaline Phosphatase: 43 U/L (ref 39–117)
Anion gap: 10 (ref 5–15)
BUN: 13 mg/dL (ref 6–23)
CO2: 26 mmol/L (ref 19–32)
Calcium: 8.2 mg/dL — ABNORMAL LOW (ref 8.4–10.5)
Chloride: 97 mmol/L (ref 96–112)
Creatinine, Ser: 1.22 mg/dL (ref 0.50–1.35)
GFR calc Af Amer: 85 mL/min — ABNORMAL LOW (ref >90)
GFR calc non Af Amer: 73 mL/min — ABNORMAL LOW (ref >90)
Glucose, Bld: 156 mg/dL — ABNORMAL HIGH (ref 70–99)
Potassium: 3.8 mmol/L (ref 3.5–5.1)
Sodium: 133 mmol/L — ABNORMAL LOW (ref 135–145)
Total Bilirubin: 0.8 mg/dL (ref 0.3–1.2)
Total Protein: 6.1 g/dL (ref 6.0–8.3)

## 2014-08-06 LAB — URINE MICROSCOPIC-ADD ON

## 2014-08-06 LAB — PROTIME-INR
INR: 1.21 (ref 0.00–1.49)
Prothrombin Time: 15.5 seconds — ABNORMAL HIGH (ref 11.6–15.2)

## 2014-08-06 LAB — GLUCOSE, CAPILLARY
Glucose-Capillary: 143 mg/dL — ABNORMAL HIGH (ref 70–99)
Glucose-Capillary: 150 mg/dL — ABNORMAL HIGH (ref 70–99)
Glucose-Capillary: 147 mg/dL — ABNORMAL HIGH (ref 70–99)
Glucose-Capillary: 84 mg/dL (ref 70–99)

## 2014-08-06 MED ORDER — COUMADIN BOOK
Freq: Once | Status: AC
  Administered 2014-08-06: 17:00:00
  Filled 2014-08-06: qty 1

## 2014-08-06 MED ORDER — WARFARIN SODIUM 5 MG PO TABS
10.0000 mg | ORAL_TABLET | Freq: Once | ORAL | Status: AC
  Administered 2014-08-06: 10 mg via ORAL
  Filled 2014-08-06: qty 2

## 2014-08-06 MED ORDER — HYDROXYZINE HCL 25 MG PO TABS
25.0000 mg | ORAL_TABLET | ORAL | Status: DC | PRN
  Administered 2014-08-07 – 2014-08-14 (×27): 25 mg via ORAL
  Filled 2014-08-06 (×28): qty 1

## 2014-08-06 MED ORDER — INSULIN DETEMIR 100 UNIT/ML ~~LOC~~ SOLN
23.0000 [IU] | Freq: Every day | SUBCUTANEOUS | Status: DC
  Administered 2014-08-06 – 2014-08-13 (×8): 23 [IU] via SUBCUTANEOUS
  Filled 2014-08-06 (×9): qty 0.23

## 2014-08-06 MED ORDER — WARFARIN VIDEO: Freq: Once | Status: DC
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
## 2014-08-06 NOTE — Discharge Instructions (Addendum)
Information on my medicine - Coumadin   (Warfarin)  This medication education was reviewed with me or my healthcare representative as part of my discharge preparation.    Why was Coumadin prescribed for you? Coumadin was prescribed for you because you have a blood clot or a medical condition that can cause an increased risk of forming blood clots. Blood clots can cause serious health problems by blocking the flow of blood to the heart, lung, or brain. Coumadin can prevent harmful blood clots from forming. As a reminder your indication for Coumadin is:   Blood Clot Prevention After Orthopedic Surgery  What test will check on my response to Coumadin? While on Coumadin (warfarin) you will need to have an INR test regularly to ensure that your dose is keeping you in the desired range. The INR (international normalized ratio) number is calculated from the result of the laboratory test called prothrombin time (PT).  If an INR APPOINTMENT HAS NOT ALREADY BEEN MADE FOR YOU please schedule an appointment to have this lab work done by your health care provider within 7 days. Your INR goal is usually a number between:  2 to 3 or your provider may give you a more narrow range like 2-2.5.  Ask your health care provider during an office visit what your goal INR is.  What  do you need to  know  About  COUMADIN? Take Coumadin (warfarin) exactly as prescribed by your healthcare provider about the same time each day.  DO NOT stop taking without talking to the doctor who prescribed the medication.  Stopping without other blood clot prevention medication to take the place of Coumadin may increase your risk of developing a new clot or stroke.  Get refills before you run out.  What do you do if you miss a dose? If you miss a dose, take it as soon as you remember on the same day then continue your regularly scheduled regimen the next day.  Do not take two doses of Coumadin at the same time.  Important Safety  Information A possible side effect of Coumadin (Warfarin) is an increased risk of bleeding. You should call your healthcare provider right away if you experience any of the following: ? Bleeding from an injury or your nose that does not stop. ? Unusual colored urine (red or dark brown) or unusual colored stools (red or black). ? Unusual bruising for unknown reasons. ? A serious fall or if you hit your head (even if there is no bleeding).  Some foods or medicines interact with Coumadin (warfarin) and might alter your response to warfarin. To help avoid this: ? Eat a balanced diet, maintaining a consistent amount of Vitamin K. ? Notify your provider about major diet changes you plan to make. ? Avoid alcohol or limit your intake to 1 drink for women and 2 drinks for men per day. (1 drink is 5 oz. wine, 12 oz. beer, or 1.5 oz. liquor.)  Make sure that ANY health care provider who prescribes medication for you knows that you are taking Coumadin (warfarin).  Also make sure the healthcare provider who is monitoring your Coumadin knows when you have started a new medication including herbals and non-prescription products.  Coumadin (Warfarin)  Major Drug Interactions  Increased Warfarin Effect Decreased Warfarin Effect  Alcohol (large quantities) Antibiotics (esp. Septra/Bactrim, Flagyl, Cipro) Amiodarone (Cordarone) Aspirin (ASA) Cimetidine (Tagamet) Megestrol (Megace) NSAIDs (ibuprofen, naproxen, etc.) Piroxicam (Feldene) Propafenone (Rythmol SR) Propranolol (Inderal) Isoniazid (INH) Posaconazole (Noxafil) Barbiturates (Phenobarbital)  Carbamazepine (Tegretol) Chlordiazepoxide (Librium) Cholestyramine (Questran) Griseofulvin Oral Contraceptives Rifampin Sucralfate (Carafate) Vitamin K   Coumadin (Warfarin) Major Herbal Interactions  Increased Warfarin Effect Decreased Warfarin Effect  Garlic Ginseng Ginkgo biloba Coenzyme Q10 Green tea St. Johns wort    Coumadin (Warfarin)  FOOD Interactions  Eat a consistent number of servings per week of foods HIGH in Vitamin K (1 serving =  cup)  Collards (cooked, or boiled & drained) Kale (cooked, or boiled & drained) Mustard greens (cooked, or boiled & drained) Parsley *serving size only =  cup Spinach (cooked, or boiled & drained) Swiss chard (cooked, or boiled & drained) Turnip greens (cooked, or boiled & drained)  Eat a consistent number of servings per week of foods MEDIUM-HIGH in Vitamin K (1 serving = 1 cup)  Asparagus (cooked, or boiled & drained) Broccoli (cooked, boiled & drained, or raw & chopped) Brussel sprouts (cooked, or boiled & drained) *serving size only =  cup Lettuce, raw (green leaf, endive, romaine) Spinach, raw Turnip greens, raw & chopped   These websites have more information on Coumadin (warfarin):  http://www.king-russell.com/; https://www.hines.net/;   [facility]uma [facility] Discharge Instructions   General Discharge Instructions  WEIGHT BEARING STATUS: TDWB Left leg, NWB R wrist  RANGE OF MOTION/ACTIVITY: posterior hip precautions Left hip   Diet: as you were eating previously.  Can use over the counter stool softeners and bowel preparations, such as Miralax, to help with bowel movements.  Narcotics can be constipating.  Be sure to drink plenty of fluids  STOP SMOKING OR USING NICOTINE PRODUCTS!!!!  As discussed nicotine severely impairs your body's ability to heal surgical and traumatic wounds but also impairs bone healing.  Wounds and bone heal by forming microscopic blood vessels (angiogenesis) and nicotine is a vasoconstrictor (essentially, shrinks blood vessels).  Therefore, if vasoconstriction occurs to these microscopic blood vessels they essentially disappear and are unable to deliver necessary nutrients to the healing tissue.  This is one modifiable factor that you can do to dramatically increase your chances of healing your injury.    (This means no smoking, no  nicotine gum, patches, etc)  DO NOT USE NONSTEROIDAL ANTI-INFLAMMATORY DRUGS (NSAID'S)  Using products such as Advil (ibuprofen), Aleve (naproxen), Motrin (ibuprofen) for additional pain control during fracture healing can delay and/or prevent the healing response.  If you would like to take over the counter (OTC) medication, Tylenol (acetaminophen) is ok.  However, some narcotic medications that are given for pain control contain acetaminophen as well. Therefore, you should not exceed more than 4000 mg of tylenol in a day if you do not have liver disease.  Also note that there are may OTC medicines, such as cold medicines and allergy medicines that my contain tylenol as well.  If you have any questions about medications and/or interactions please ask your doctor/PA or your pharmacist.   PAIN MEDICATION USE AND EXPECTATIONS  You have likely been given narcotic medications to help control your pain.  After a traumatic event that results in an fracture (broken bone) with or without surgery, it is ok to use narcotic pain medications to help control one's pain.  We understand that everyone responds to pain differently and each individual patient will be evaluated on a regular basis for the continued need for narcotic medications. Ideally, narcotic medication use should last no more than 6-8 weeks (coinciding with fracture healing).   As a patient it is your responsibility as well to monitor narcotic medication use and report the amount and  frequency you use these medications when you come to your office visit.   We would also advise that if you are using narcotic medications, you should take a dose prior to therapy to maximize you participation.  IF YOU ARE ON NARCOTIC MEDICATIONS IT IS NOT PERMISSIBLE TO OPERATE A MOTOR VEHICLE (MOTORCYCLE/CAR/TRUCK/MOPED) OR HEAVY MACHINERY DO NOT MIX NARCOTICS WITH OTHER CNS (CENTRAL NERVOUS SYSTEM) DEPRESSANTS SUCH AS ALCOHOL       ICE AND ELEVATE INJURED/OPERATIVE  EXTREMITY  Using ice and elevating the injured extremity above your heart can help with swelling and pain control.  Icing in a pulsatile fashion, such as 20 minutes on and 20 minutes off, can be followed.    Do not place ice directly on skin. Make sure there is a barrier between to skin and the ice pack.    Using frozen items such as frozen peas works well as the conform nicely to the are that needs to be iced.  USE AN ACE WRAP OR TED HOSE FOR SWELLING CONTROL  In addition to icing and elevation, Ace wraps or TED hose are used to help limit and resolve swelling.  It is recommended to use Ace wraps or TED hose until you are informed to stop.    When using Ace Wraps start the wrapping distally (farthest away from the body) and wrap proximally (closer to the body)   Example: If you had surgery on your leg or thing and you do not have a splint on, start the ace wrap at the toes and work your way up to the thigh        If you had surgery on your upper extremity and do not have a splint on, start the ace wrap at your fingers and work your way up to the upper arm  IF YOU ARE IN A SPLINT OR CAST DO NOT REMOVE IT FOR ANY REASON   If your splint gets wet for any reason please contact the office immediately. You may shower in your splint or cast as long as you keep it dry.  This can be done by wrapping in a cast cover or garbage back (or similar)  Do Not stick any thing down your splint or cast such as pencils, money, or hangers to try and scratch yourself with.  If you feel itchy take benadryl as prescribed on the bottle for itching  IF YOU ARE IN A CAM BOOT (BLACK BOOT)  You may remove boot periodically. Perform daily dressing changes as noted below.  Wash the liner of the boot regularly and wear a sock when wearing the boot. It is recommended that you sleep in the boot until told otherwise  CALL THE OFFICE WITH ANY QUESTIONS OR CONCERTS: [phone]     Discharge Pin Site Instructions  Dress pins  daily with Kerlix roll starting on POD 2. Wrap the Kerlix so that it tamps the skin down around the pin-skin interface to prevent/limit motion of the skin relative to the pin.  (Pin-skin motion is the primary cause of pain and infection related to external fixator pin sites).  Remove any crust or coagulum that may obstruct drainage with a saline moistened gauze or soap and water.  After POD 3, if there is no discernable drainage on the pin site dressing, the interval for change can by increased to every other day.  You may shower with the fixator, cleaning all pin sites gently with soap and water.  If you have a surgical wound this  needs to be completely dry and without drainage before showering.  The extremity can be lifted by the fixator to facilitate wound care and transfers.  Notify the office/Doctor if you experience increasing drainage, redness, or pain from a pin site, or if you notice purulent (thick, snot-like) drainage.  Discharge Wound Care Instructions  Do NOT apply any ointments, solutions or lotions to pin sites or surgical wounds.  These prevent needed drainage and even though solutions like hydrogen peroxide kill bacteria, they also damage cells lining the pin sites that help fight infection.  Applying lotions or ointments can keep the wounds moist and can cause them to breakdown and open up as well. This can increase the risk for infection. When in doubt call the office.  Surgical incisions should be dressed daily.  If any drainage is noted, use one layer of adaptic, then gauze, Kerlix, and an ace wrap.  Once the incision is completely dry and without drainage, it may be left open to air out.  Showering may begin 36-48 hours later.  Cleaning gently with soap and water.  Traumatic wounds should be dressed daily as well.    One layer of adaptic, gauze, Kerlix, then ace wrap.  The adaptic can be discontinued once the draining has ceased    If you have a wet to dry dressing: wet  the gauze with saline the squeeze as much saline out so the gauze is moist (not soaking wet), place moistened gauze over wound, then place a dry gauze over the moist one, followed by Kerlix wrap, then ace wrap.
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
## 2014-08-06 NOTE — Progress Notes (Signed)
VASCULAR LAB PRELIMINARY  PRELIMINARY  PRELIMINARY  PRELIMINARY  Bilateral lower extremity venous duplex completed.    Preliminary report:  Bilateral:  No obvious evidence of DVT, superficial thrombosis, or Baker's Cyst. Technically difficult due to swelling and suboptimal positioning due to injuries.   Roxan Yamamoto, RVS 08/06/2014, 1:38 PM

## 2014-08-06 NOTE — Progress Notes (Signed)
OT Cancellation Note  Patient Details Name: Marcus [name] MRN: [id number] DOB: [birth year]   Cancelled Treatment:    Reason Eval/Treat Not Completed: Patient at procedure or test/ unavailable- X-ray. Will re-attempt OT eval/treat as able and appropriate.   Lilou Kneip , MS, OTR/L, CLT Pager: [phone]  08/06/2014, 1:29 PM
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
## 2014-08-06 NOTE — Progress Notes (Signed)
UR completed.  Pt in 2-story apt with 15 steps to get in. Therapies recommending SNF but that is not a viable option. Pt and family agreeable to going home but will need to be able to safely move pt. Plans for family to work with therapy to learn mobility assistance. Medical team aware of d/c needs and plans to d/c patient home when plan is safe and ambulance transport to 2nd story apartment that he can't safely exit in case of emergency is not a safe plan.   Carlyle LipaMichelle Sidi Dzikowski, RN BSN MHA CCM Trauma/Neuro ICU Case Manager [phone]

## 2014-08-07 ENCOUNTER — Inpatient Hospital Stay (HOSPITAL_COMMUNITY): Payer: BLUE CROSS/BLUE SHIELD

## 2014-08-07 DIAGNOSIS — E213 Hyperparathyroidism, unspecified: Secondary | ICD-10-CM

## 2014-08-07 LAB — COMPREHENSIVE METABOLIC PANEL
ALT: 158 U/L — ABNORMAL HIGH (ref 0–53)
AST: 627 U/L — ABNORMAL HIGH (ref 0–37)
Albumin: 2.1 g/dL — ABNORMAL LOW (ref 3.5–5.2)
Alkaline Phosphatase: 46 U/L (ref 39–117)
Anion gap: 14 (ref 5–15)
BUN: 15 mg/dL (ref 6–23)
CO2: 24 mmol/L (ref 19–32)
Calcium: 8.3 mg/dL — ABNORMAL LOW (ref 8.4–10.5)
Chloride: 95 mmol/L — ABNORMAL LOW (ref 96–112)
Creatinine, Ser: 1.16 mg/dL (ref 0.50–1.35)
GFR calc Af Amer: >90 mL/min (ref >90)
GFR calc non Af Amer: 78 mL/min — ABNORMAL LOW (ref >90)
Glucose, Bld: 161 mg/dL — ABNORMAL HIGH (ref 70–99)
Potassium: 3.9 mmol/L (ref 3.5–5.1)
Sodium: 133 mmol/L — ABNORMAL LOW (ref 135–145)
Total Bilirubin: 0.5 mg/dL (ref 0.3–1.2)
Total Protein: 6.3 g/dL (ref 6.0–8.3)

## 2014-08-07 LAB — CBC
HCT: 28.1 % — ABNORMAL LOW (ref 39.0–52.0)
Hemoglobin: 9.4 g/dL — ABNORMAL LOW (ref 13.0–17.0)
MCH: 30.8 pg (ref 26.0–34.0)
MCHC: 33.5 g/dL (ref 30.0–36.0)
MCV: 92.1 fL (ref 78.0–100.0)
Platelets: 244 10*3/uL (ref 150–400)
RBC: 3.05 MIL/uL — ABNORMAL LOW (ref 4.22–5.81)
RDW: 12.8 % (ref 11.5–15.5)
WBC: 9 10*3/uL (ref 4.0–10.5)

## 2014-08-07 LAB — URINE CULTURE: Colony Count: 4000

## 2014-08-07 LAB — FOLLICLE STIMULATING HORMONE: FSH: 1.3 m[IU]/mL — ABNORMAL LOW (ref 1.5–12.4)

## 2014-08-07 LAB — GLUCOSE, CAPILLARY
Glucose-Capillary: 150 mg/dL — ABNORMAL HIGH (ref 70–99)
Glucose-Capillary: 174 mg/dL — ABNORMAL HIGH (ref 70–99)
Glucose-Capillary: 163 mg/dL — ABNORMAL HIGH (ref 70–99)
Glucose-Capillary: 123 mg/dL — ABNORMAL HIGH (ref 70–99)

## 2014-08-07 LAB — PREALBUMIN: Prealbumin: 16 mg/dL (ref 18.0–45.0)

## 2014-08-07 LAB — PROTIME-INR
INR: 1.4 (ref 0.00–1.49)
Prothrombin Time: 17.3 s — ABNORMAL HIGH (ref 11.6–15.2)

## 2014-08-07 LAB — LUTEINIZING HORMONE: LH: 1.5 m[IU]/mL — ABNORMAL LOW (ref 1.7–8.6)

## 2014-08-07 LAB — LIPASE, BLOOD: Lipase: 20 U/L (ref 11–59)

## 2014-08-07 MED ORDER — CHOLECALCIFEROL 25 MCG (1000 UT) PO TABS: 1000.0000 [IU] | ORAL_TABLET | Freq: Two times a day (BID) | ORAL | Status: DC

## 2014-08-07 MED ORDER — VITAMIN D (ERGOCALCIFEROL) 1.25 MG (50000 UNIT) PO CAPS: 50000.0000 [IU] | ORAL_CAPSULE | ORAL | Status: DC

## 2014-08-07 MED ORDER — WARFARIN SODIUM 5 MG PO TABS
10.0000 mg | ORAL_TABLET | Freq: Once | ORAL | Status: AC
  Administered 2014-08-07: 10 mg via ORAL
  Filled 2014-08-07: qty 2

## 2014-08-07 MED ORDER — ASCORBIC ACID 1000 MG PO TABS: 1000.0000 mg | ORAL_TABLET | Freq: Every day | ORAL | Status: DC
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
## 2014-08-07 NOTE — Progress Notes (Signed)
ANTICOAGULATION CONSULT NOTE - Follow Up Consult  Pharmacy Consult for coumadin Indication: VTE prophylaxis  Allergies  Allergen Reactions  . Vicodin [Hydrocodone-Acetaminophen] Hives and Itching    Patient Measurements:   Heparin Dosing Weight:   Vital Signs: Temp: 100.1 F (37.8 C) (03/25 0506) BP: 143/89 mmHg (03/25 0506) Pulse Rate: 132 (03/25 0506)  Labs:  Recent Labs  08/04/14 1052 08/05/14 0940 08/06/14 0544 08/07/14 0529  HGB  --  9.8*  --  9.4*  HCT  --  29.3*  --  28.1*  PLT  --  160  --  244  LABPROT  --   --  15.5* 17.3*  INR  --   --  1.21 1.40  CREATININE 1.24  --  1.22 1.16    CrCl cannot be calculated (Unknown ideal weight.).   Medications:  Scheduled:  . cholecalciferol  1,000 Units Oral BID  . docusate sodium  100 mg Oral BID  . enoxaparin (LOVENOX) injection  30 mg Subcutaneous Q12H  . insulin aspart  0-20 Units Subcutaneous TID WC  . insulin detemir  23 Units Subcutaneous QHS  . lisinopril  10 mg Oral Daily  . multivitamin with minerals  1 tablet Oral Daily  . polyethylene glycol  17 g Oral Daily  . traMADol  100 mg Oral 4 times per day  . vitamin C  1,000 mg Oral Daily  . Vitamin D (Ergocalciferol)  50,000 Units Oral Q Tue  . warfarin   Does not apply Once  . Warfarin - Pharmacist Dosing Inpatient   Does not apply q1800   Infusions:    Assessment: 39 yo male s/p ortho surgery is currently on subtherapeutic coumadin.  INR today is up to 1.4 from 1.21.  Patient is also on lovenox 30 mg sq q12h.  Hgb 9.4 and Plt 244 K stable. Goal of Therapy:  INR 2-3 Monitor platelets by anticoagulation protocol: Yes   Plan:  - Repeat Warfarin 10mg  PO x 1 tonight - Daily INR - d/c lovenox when INR >/=2   Suann Klier, Tsz-Yin 08/07/2014,10:28 AM
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
## 2014-08-07 NOTE — Progress Notes (Signed)
PT Cancellation Note  Patient Details Name: Marcus [name] MRN: [id number] DOB: Apr 12, 1976   Cancelled Treatment:    Reason Eval/Treat Not Completed: Patient at procedure or test/unavailable (Taken down to x-ray).  PT will see when pt returns to Rm 5N32 if still appropriate.  Michail JewelsAshley Parr PT, DPT [phone] 08/07/2014, 11:38 AM

## 2014-08-08 LAB — GLUCOSE, CAPILLARY
Glucose-Capillary: 124 mg/dL — ABNORMAL HIGH (ref 70–99)
Glucose-Capillary: 146 mg/dL — ABNORMAL HIGH (ref 70–99)
Glucose-Capillary: 148 mg/dL — ABNORMAL HIGH (ref 70–99)
Glucose-Capillary: 133 mg/dL — ABNORMAL HIGH (ref 70–99)

## 2014-08-08 LAB — VITAMIN D 1,25 DIHYDROXY
Vitamin D 1, 25 (OH)2 Total: 43 pg/mL
Vitamin D2 1, 25 (OH)2: <10 pg/mL
Vitamin D3 1, 25 (OH)2: 43 pg/mL

## 2014-08-08 LAB — PROTIME-INR
INR: 1.98 — ABNORMAL HIGH (ref 0.00–1.49)
Prothrombin Time: 22.7 s — ABNORMAL HIGH (ref 11.6–15.2)

## 2014-08-08 LAB — CBC
HCT: 27.7 % — ABNORMAL LOW (ref 39.0–52.0)
Hemoglobin: 8.9 g/dL — ABNORMAL LOW (ref 13.0–17.0)
MCH: 29.9 pg (ref 26.0–34.0)
MCHC: 32.1 g/dL (ref 30.0–36.0)
MCV: 93 fL (ref 78.0–100.0)
Platelets: 315 10*3/uL (ref 150–400)
RBC: 2.98 MIL/uL — ABNORMAL LOW (ref 4.22–5.81)
RDW: 12.7 % (ref 11.5–15.5)
WBC: 6 10*3/uL (ref 4.0–10.5)

## 2014-08-08 LAB — RETICULOCYTES
RBC.: 2.98 MIL/uL — ABNORMAL LOW (ref 4.22–5.81)
Retic Count, Absolute: 56.6 10*3/uL (ref 19.0–186.0)
Retic Ct Pct: 1.9 % (ref 0.4–3.1)

## 2014-08-08 LAB — COMPREHENSIVE METABOLIC PANEL
ALT: 150 U/L — ABNORMAL HIGH (ref 0–53)
AST: 557 U/L — ABNORMAL HIGH (ref 0–37)
Albumin: 2.1 g/dL — ABNORMAL LOW (ref 3.5–5.2)
Alkaline Phosphatase: 45 U/L (ref 39–117)
Anion gap: 5 (ref 5–15)
BUN: 13 mg/dL (ref 6–23)
CO2: 35 mmol/L — ABNORMAL HIGH (ref 19–32)
Calcium: 8.3 mg/dL — ABNORMAL LOW (ref 8.4–10.5)
Chloride: 95 mmol/L — ABNORMAL LOW (ref 96–112)
Creatinine, Ser: 1.1 mg/dL (ref 0.50–1.35)
GFR calc Af Amer: >90 mL/min (ref >90)
GFR calc non Af Amer: 83 mL/min — ABNORMAL LOW (ref >90)
Glucose, Bld: 123 mg/dL — ABNORMAL HIGH (ref 70–99)
Potassium: 3.7 mmol/L (ref 3.5–5.1)
Sodium: 135 mmol/L (ref 135–145)
Total Bilirubin: 0.6 mg/dL (ref 0.3–1.2)
Total Protein: 6.4 g/dL (ref 6.0–8.3)

## 2014-08-08 LAB — FOLATE: Folate: >20 ng/mL

## 2014-08-08 LAB — FERRITIN: Ferritin: 349 ng/mL — ABNORMAL HIGH (ref 22–322)

## 2014-08-08 LAB — IRON AND TIBC
Iron: 13 ug/dL — ABNORMAL LOW (ref 42–165)
Saturation Ratios: 7 % — ABNORMAL LOW (ref 20–55)
TIBC: 196 ug/dL — ABNORMAL LOW (ref 215–435)
UIBC: 183 ug/dL (ref 125–400)

## 2014-08-08 LAB — VITAMIN B12: Vitamin B-12: 493 pg/mL (ref 211–911)

## 2014-08-08 MED ORDER — BISACODYL 10 MG RE SUPP
10.0000 mg | Freq: Every day | RECTAL | Status: DC
  Administered 2014-08-08: 10 mg via RECTAL
  Filled 2014-08-08 (×2): qty 1

## 2014-08-08 MED ORDER — WARFARIN SODIUM 7.5 MG PO TABS
7.5000 mg | ORAL_TABLET | Freq: Once | ORAL | Status: AC
  Administered 2014-08-08: 7.5 mg via ORAL
  Filled 2014-08-08: qty 1

## 2014-08-08 MED ORDER — IPRATROPIUM-ALBUTEROL 0.5-2.5 (3) MG/3ML IN SOLN: 3.0000 mL | RESPIRATORY_TRACT | Status: DC

## 2014-08-08 MED ORDER — METFORMIN HCL 500 MG PO TABS
1000.0000 mg | ORAL_TABLET | Freq: Two times a day (BID) | ORAL | Status: DC
  Administered 2014-08-09 – 2014-08-14 (×12): 1000 mg via ORAL
  Filled 2014-08-08 (×12): qty 2

## 2014-08-08 MED ORDER — IPRATROPIUM-ALBUTEROL 0.5-2.5 (3) MG/3ML IN SOLN: 3.0000 mL | RESPIRATORY_TRACT | Status: DC | PRN

## 2014-08-08 MED ORDER — MAGNESIUM CITRATE PO SOLN
1.0000 | Freq: Once | ORAL | Status: AC
  Administered 2014-08-08: 1 via ORAL
  Filled 2014-08-08: qty 296

## 2014-08-08 NOTE — Progress Notes (Deleted)
Patient found on floor by daughter, contacted myself for assistance. Additional assistance in moving patient to be bed provided by Carolyn, RN and Aicha, NT on 08/07/14 at 2355. Patient in no distress, AO x 4, no complaints of pain and no apparent injury. Patient reports having a bad dream. Prior to fall patient was monitored by camera, bed alarms were set, side rails were raised, daughter at bedside sleep and patient was sleep upon hourly rounding. M. Lynch, NP on-call contacted but referred to T. Callahan, NP whom was on-site. Portable x-ray of the right ankle ordered. Vital signs stable: T-97.8F (oral), BP-122/48, P-72, Resp 18 and 91% SpO2 RA. Will continue to monitor patient.  
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
## 2014-08-08 NOTE — Progress Notes (Signed)
ANTICOAGULATION CONSULT NOTE - Follow Up Consult  Pharmacy Consult for coumadin Indication: VTE prophylaxis  Allergies  Allergen Reactions  . Vicodin [Hydrocodone-Acetaminophen] Hives and Itching    Patient Measurements:   Heparin Dosing Weight:   Vital Signs: Temp: 99.8 F (37.7 C) (03/26 0400) Temp Source: Oral (03/26 0400) BP: 128/87 mmHg (03/26 0400) Pulse Rate: 122 (03/26 0830)  Labs:  Recent Labs  08/06/14 0544 08/07/14 0529 08/08/14 0534  HGB  --  9.4* 8.9*  HCT  --  28.1* 27.7*  PLT  --  244 315  LABPROT 15.5* 17.3* 22.7*  INR 1.21 1.40 1.98*  CREATININE 1.22 1.16 1.10    CrCl cannot be calculated (Unknown ideal weight.).   Medications:  Scheduled:  . bisacodyl  10 mg Rectal Daily  . cholecalciferol  1,000 Units Oral BID  . docusate sodium  100 mg Oral BID  . enoxaparin (LOVENOX) injection  30 mg Subcutaneous Q12H  . insulin aspart  0-20 Units Subcutaneous TID WC  . insulin detemir  23 Units Subcutaneous QHS  . lisinopril  10 mg Oral Daily  . [START ON 08/09/2014] metFORMIN  1,000 mg Oral BID WC  . multivitamin with minerals  1 tablet Oral Daily  . polyethylene glycol  17 g Oral Daily  . traMADol  100 mg Oral 4 times per day  . vitamin C  1,000 mg Oral Daily  . Vitamin D (Ergocalciferol)  50,000 Units Oral Q Tue  . warfarin   Does not apply Once  . Warfarin - Pharmacist Dosing Inpatient   Does not apply q1800   Infusions:    Assessment: 39 yo male s/p ortho surgery is currently on subtherapeutic coumadin.  INR today is up to 1.98 from 1.4.  Patient is also on lovenox 30 mg sq q12h.  Goal of Therapy:  INR 2-3 Monitor platelets by anticoagulation protocol: Yes   Plan:  - Coumadin 7.5 mg po x1 - Daily INR - d/c lovenox when INR >/=2   Kamerin Grumbine, Tsz-Yin 08/08/2014,11:24 AM

## 2014-08-09 ENCOUNTER — Inpatient Hospital Stay (HOSPITAL_COMMUNITY): Payer: BLUE CROSS/BLUE SHIELD

## 2014-08-09 LAB — CBC
HCT: 28.3 % — ABNORMAL LOW (ref 39.0–52.0)
Hemoglobin: 9.2 g/dL — ABNORMAL LOW (ref 13.0–17.0)
MCH: 30.2 pg (ref 26.0–34.0)
MCHC: 32.5 g/dL (ref 30.0–36.0)
MCV: 92.8 fL (ref 78.0–100.0)
Platelets: 387 10*3/uL (ref 150–400)
RBC: 3.05 MIL/uL — ABNORMAL LOW (ref 4.22–5.81)
RDW: 12.7 % (ref 11.5–15.5)
WBC: 5.4 10*3/uL (ref 4.0–10.5)

## 2014-08-09 LAB — COMPREHENSIVE METABOLIC PANEL
ALT: 147 U/L — ABNORMAL HIGH (ref 0–53)
AST: 455 U/L — ABNORMAL HIGH (ref 0–37)
Albumin: 2.1 g/dL — ABNORMAL LOW (ref 3.5–5.2)
Alkaline Phosphatase: 51 U/L (ref 39–117)
Anion gap: 8 (ref 5–15)
BUN: 12 mg/dL (ref 6–23)
CO2: 32 mmol/L (ref 19–32)
Calcium: 8.3 mg/dL — ABNORMAL LOW (ref 8.4–10.5)
Chloride: 95 mmol/L — ABNORMAL LOW (ref 96–112)
Creatinine, Ser: 1.11 mg/dL (ref 0.50–1.35)
GFR calc Af Amer: >90 mL/min (ref >90)
GFR calc non Af Amer: 82 mL/min — ABNORMAL LOW (ref >90)
Glucose, Bld: 112 mg/dL — ABNORMAL HIGH (ref 70–99)
Potassium: 3.8 mmol/L (ref 3.5–5.1)
Sodium: 135 mmol/L (ref 135–145)
Total Bilirubin: 0.6 mg/dL (ref 0.3–1.2)
Total Protein: 6.6 g/dL (ref 6.0–8.3)

## 2014-08-09 LAB — PROTIME-INR
INR: 1.73 — ABNORMAL HIGH (ref 0.00–1.49)
Prothrombin Time: 20.4 seconds — ABNORMAL HIGH (ref 11.6–15.2)

## 2014-08-09 LAB — GLUCOSE, CAPILLARY
Glucose-Capillary: 130 mg/dL — ABNORMAL HIGH (ref 70–99)
Glucose-Capillary: 111 mg/dL — ABNORMAL HIGH (ref 70–99)
Glucose-Capillary: 128 mg/dL — ABNORMAL HIGH (ref 70–99)
Glucose-Capillary: 116 mg/dL — ABNORMAL HIGH (ref 70–99)

## 2014-08-09 MED ORDER — AMOXICILLIN 500 MG PO CAPS
500.0000 mg | ORAL_CAPSULE | Freq: Three times a day (TID) | ORAL | Status: DC
  Administered 2014-08-09 – 2014-08-14 (×16): 500 mg via ORAL
  Filled 2014-08-09 (×16): qty 1

## 2014-08-09 MED ORDER — WARFARIN SODIUM 5 MG PO TABS
10.0000 mg | ORAL_TABLET | Freq: Once | ORAL | Status: AC
  Administered 2014-08-09: 10 mg via ORAL
  Filled 2014-08-09: qty 2
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
## 2014-08-09 NOTE — Progress Notes (Signed)
ANTICOAGULATION CONSULT NOTE - Follow Up Consult  Pharmacy Consult for coumadin Indication: VTE prophylaxis  Allergies  Allergen Reactions  . Vicodin [Hydrocodone-Acetaminophen] Hives and Itching    Patient Measurements:   Heparin Dosing Weight:   Vital Signs: Temp: 99.2 F (37.3 C) (03/27 0501) Temp Source: Oral (03/27 0501) BP: 148/85 mmHg (03/27 0501) Pulse Rate: 112 (03/27 0501)  Labs:  Recent Labs  08/07/14 0529 08/08/14 0534 08/09/14 0430  HGB 9.4* 8.9* 9.2*  HCT 28.1* 27.7* 28.3*  PLT 244 315 387  LABPROT 17.3* 22.7* 20.4*  INR 1.40 1.98* 1.73*  CREATININE 1.16 1.10 1.11    CrCl cannot be calculated (Unknown ideal weight.).   Medications:  Scheduled:  . amoxicillin  500 mg Oral 3 times per day  . bisacodyl  10 mg Rectal Daily  . cholecalciferol  1,000 Units Oral BID  . docusate sodium  100 mg Oral BID  . enoxaparin (LOVENOX) injection  30 mg Subcutaneous Q12H  . insulin aspart  0-20 Units Subcutaneous TID WC  . insulin detemir  23 Units Subcutaneous QHS  . lisinopril  10 mg Oral Daily  . metFORMIN  1,000 mg Oral BID WC  . multivitamin with minerals  1 tablet Oral Daily  . polyethylene glycol  17 g Oral Daily  . traMADol  100 mg Oral 4 times per day  . vitamin C  1,000 mg Oral Daily  . Vitamin D (Ergocalciferol)  50,000 Units Oral Q Tue  . warfarin   Does not apply Once  . Warfarin - Pharmacist Dosing Inpatient   Does not apply q1800   Infusions:    Assessment: [age] yo male s/p ortho surgery is currently on subtherapeutic coumadin.  INR today is down to 1.73.  Hgb 9.2 and Plt 387 K.  Patient is also on lovenox 30 mg sq q12h. Goal of Therapy:  INR 2-3 Monitor platelets by anticoagulation protocol: Yes   Plan:  - Coumadin 10 mg po x1 - Daily INR - d/c lovenox when INR >/=2     Emaad Nanna, Tsz-Yin 08/09/2014,11:39 AM

## 2014-08-09 NOTE — Progress Notes (Signed)
Pt itching most of night while awake. Said he talked w/DR and he knew this was expected taking narcotic pain meds. He has broken open skin on left hand and right shoulder. Gave him ice and powder for middle of back. Pt has been applying lotion and hydroxyzine given as needed.  Advised him to speak with dr about non narcotic pain meds and/or increasing hydroxyzine which would make him sleepier. Pt at risk for infection w/open wounds from itching.

## 2014-08-10 ENCOUNTER — Ambulatory Visit: Payer: BLUE CROSS/BLUE SHIELD | Admitting: Family Medicine

## 2014-08-10 DIAGNOSIS — R7401 Elevation of levels of liver transaminase levels: Secondary | ICD-10-CM | POA: Diagnosis not present

## 2014-08-10 DIAGNOSIS — R740 Nonspecific elevation of levels of transaminase and lactic acid dehydrogenase [LDH]: Secondary | ICD-10-CM

## 2014-08-10 DIAGNOSIS — K047 Periapical abscess without sinus: Secondary | ICD-10-CM | POA: Diagnosis present

## 2014-08-10 LAB — GLUCOSE, CAPILLARY
Glucose-Capillary: 98 mg/dL (ref 70–99)
Glucose-Capillary: 126 mg/dL — ABNORMAL HIGH (ref 70–99)
Glucose-Capillary: 85 mg/dL (ref 70–99)
Glucose-Capillary: 109 mg/dL — ABNORMAL HIGH (ref 70–99)

## 2014-08-10 LAB — TESTOSTERONE: Testosterone: 46 ng/dL — ABNORMAL LOW (ref 300–890)

## 2014-08-10 LAB — SEX HORMONE BINDING GLOBULIN: Sex Hormone Binding: 11 nmol/L (ref 10–50)

## 2014-08-10 LAB — TESTOSTERONE, FREE: Testosterone, Free: 13.5 pg/mL — ABNORMAL LOW (ref 47.0–244.0)

## 2014-08-10 LAB — TESTOSTERONE, % FREE: Testosterone-% Free: 2.9 % — ABNORMAL HIGH (ref 1.6–2.9)

## 2014-08-10 LAB — PROTIME-INR
INR: 2.19 — ABNORMAL HIGH (ref 0.00–1.49)
Prothrombin Time: 24.5 s — ABNORMAL HIGH (ref 11.6–15.2)

## 2014-08-10 LAB — PROLACTIN: Prolactin: 20.8 ng/mL — ABNORMAL HIGH (ref 4.0–15.2)

## 2014-08-10 MED ORDER — WARFARIN SODIUM 5 MG PO TABS
10.0000 mg | ORAL_TABLET | Freq: Once | ORAL | Status: AC
  Administered 2014-08-10: 10 mg via ORAL
  Filled 2014-08-10: qty 2

## 2014-08-10 MED ORDER — INSULIN STARTER KIT- PEN NEEDLES (ENGLISH)
1.0000 | Freq: Once | Status: AC
  Administered 2014-08-10: 1
  Filled 2014-08-10: qty 1

## 2014-08-10 NOTE — Progress Notes (Signed)
ANTICOAGULATION CONSULT NOTE - Follow Up Consult  Pharmacy Consult for coumadin Indication: VTE prophylaxis  Allergies  Allergen Reactions  . Vicodin [Hydrocodone-Acetaminophen] Hives and Itching    Patient Measurements:   Heparin Dosing Weight:   Vital Signs: Temp: 99.1 F (37.3 C) (03/28 0526) Temp Source: Oral (03/28 0526) BP: 102/60 mmHg (03/28 0526) Pulse Rate: 101 (03/28 0526)  Labs:  Recent Labs  08/08/14 0534 08/09/14 0430 08/10/14 0630  HGB 8.9* 9.2*  --   HCT 27.7* 28.3*  --   PLT 315 387  --   LABPROT 22.7* 20.4* 24.5*  INR 1.98* 1.73* 2.19*  CREATININE 1.10 1.11  --     CrCl cannot be calculated (Unknown ideal weight.).   Medications:  Scheduled:  . amoxicillin  500 mg Oral 3 times per day  . bisacodyl  10 mg Rectal Daily  . cholecalciferol  1,000 Units Oral BID  . docusate sodium  100 mg Oral BID  . insulin aspart  0-20 Units Subcutaneous TID WC  . insulin detemir  23 Units Subcutaneous QHS  . lisinopril  10 mg Oral Daily  . metFORMIN  1,000 mg Oral BID WC  . multivitamin with minerals  1 tablet Oral Daily  . polyethylene glycol  17 g Oral Daily  . traMADol  100 mg Oral 4 times per day  . vitamin C  1,000 mg Oral Daily  . Vitamin D (Ergocalciferol)  50,000 Units Oral Q Tue  . warfarin   Does not apply Once  . Warfarin - Pharmacist Dosing Inpatient   Does not apply q1800   Infusions:    Assessment: 39 yo male s/p ortho surgery is currently on therapeutic coumadin.  INR today is up to 2.19 from 1.73.  Patient is also on lovenox 30 mg sq q12h Goal of Therapy:  INR 2-3 Monitor platelets by anticoagulation protocol: Yes   Plan:  - Coumadin 10 mg po x1 - Daily INR - d/c lovenox since INR >/=2   Aleeah Greeno, Tsz-Yin 08/10/2014,1:40 PM
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
## 2014-08-10 NOTE — Progress Notes (Signed)
Patient's disposition precludes opportunity for inpatient rehabilitation services due to weightbearing restrictions and the fact the patient lives on the second level apartment. The short length of stay that that would be given to patient would not change discharge plan. Recommendations at this time are for skilled nursing facility
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
## 2014-08-10 NOTE — Clinical Social Work Note (Signed)
Clinical Social Worker continuing to follow patient and family for support.  Patient continuing to work with therapies to complete stairs for safety prior to discharge home.  CSW attempted to visit with patient and family, however patient sleeping and no family present at bedside.  CSW remains available for support as needed.  Macario GoldsJesse Barrington [place]ley, [place] [phone]

## 2014-08-10 NOTE — Clinical Social Work Note (Signed)
Clinical Social Work Department CLINICAL SOCIAL WORK PLACEMENT NOTE 08/05/2014  Patient:  Marcus [name],Marcus [name]  Account Number:  [id number] Admit date:  08/01/2014  Clinical Social Worker:  Macario GoldsJESSE Katiana Ruland, LCSW  Date/time:  08/05/2014 02:30 PM  Clinical Social Work is seeking post-discharge placement for this patient at the following level of care:   SKILLED NURSING   (*CSW will update this form in Epic as items are completed)   08/10/2014  Patient/family provided with [facility] of Clinical [facility] Work's list of facilities offering this level of care within the geographic area requested by the patient (or if unable, by the patient's family).  08/10/2014  Patient/family informed of their freedom to choose among providers that offer the needed level of care, that participate in Medicare, Medicaid or managed care program needed by the patient, have an available bed and are willing to accept the patient.  08/10/2014  Patient/family informed of [facility]' ownership interest in [facility], as well as of the fact that they are under no obligation to receive care at this facility.  PASARR submitted to EDS on 08/10/2014 PASARR number received on 08/10/2014  FL2 transmitted to all facilities in geographic area requested by pt/family on  08/10/2014 FL2 transmitted to all facilities within larger geographic area on   Patient informed that his/her managed care company has contracts with or will negotiate with  certain facilities, including the following:     Patient/family informed of bed offers received:   Patient chooses bed at  Physician recommends and patient chooses bed at    Patient to be transferred to  on   Patient to be transferred to facility by  Patient and family notified of transfer on  Name of family member notified:    The following physician request were entered in Epic:   Additional Comments:
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
## 2014-08-11 LAB — PROTIME-INR
INR: 2.22 — ABNORMAL HIGH (ref 0.00–1.49)
Prothrombin Time: 24.8 s — ABNORMAL HIGH (ref 11.6–15.2)

## 2014-08-11 LAB — GLUCOSE, CAPILLARY
Glucose-Capillary: 78 mg/dL (ref 70–99)
Glucose-Capillary: 93 mg/dL (ref 70–99)
Glucose-Capillary: 131 mg/dL — ABNORMAL HIGH (ref 70–99)
Glucose-Capillary: 124 mg/dL — ABNORMAL HIGH (ref 70–99)

## 2014-08-11 MED ORDER — WARFARIN SODIUM 5 MG PO TABS
10.0000 mg | ORAL_TABLET | Freq: Once | ORAL | Status: AC
  Administered 2014-08-11: 10 mg via ORAL
  Filled 2014-08-11: qty 2
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
## 2014-08-11 NOTE — Progress Notes (Signed)
Inpatient Diabetes Program Recommendations  AACE/ADA: New Consensus Statement on Inpatient Glycemic Control (2013)  Target Ranges:  Prepandial:   less than 140 mg/dL      Peak postprandial:   less than 180 mg/dL (1-2 hours)      Critically ill patients:  140 - 180 mg/dL     Results for Marcus [name], Marcus [name] (MRN [id number]) as of 08/11/2014 12:41  Ref. Range 08/11/2014 06:33 08/11/2014 11:29  Glucose-Capillary Latest Range: 70-99 mg/dL 78 93    Home DM Meds: Metformin 1000 mg bid  Current DM Orders: Levemir 23 units QHS  Novolog Resistant SSI tid  Metformin 1000 mg bid     **CBGs very well controlled on current DM regimen.  **Educated patient on insulin pen use at home.  Reviewed contents of insulin flexpen starter kit.  Reviewed all steps if insulin pen including attachment of needle, 2-unit air shot, dialing up dose, giving injection, removing needle, disposal of sharps, storage of unused insulin, disposal of insulin etc.  Patient able to provide successful return demonstration.  Also reviewed troubleshooting with insulin pen.  MD to give patient Rxs for insulin pens and insulin pen needles.    MD- If you decide to send patient home on insulin, please consider a simple regimen until patient can follow up with his PCP Dr. Lorenso Courier.  Recommend Levemir 23 units QHS + Metformin 1000 mg bid  Please give patient Rx for Lantus insulin pens and Insulin pen needles  Lantus Solostar insulin pen [order # [id number]] Insulin Pen needles- 31 gauge x 3m [order # [id number]]CBG Meter and supplies [Order # [id number]   Will follow JWyn QuakerRN, MSN, CDE Diabetes Coordinator Inpatient Diabetes Program Team Pager: [phone](8a-5p)
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
## 2014-08-12 LAB — GLUCOSE, CAPILLARY
Glucose-Capillary: 95 mg/dL (ref 70–99)
Glucose-Capillary: 103 mg/dL — ABNORMAL HIGH (ref 70–99)
Glucose-Capillary: 128 mg/dL — ABNORMAL HIGH (ref 70–99)
Glucose-Capillary: 131 mg/dL — ABNORMAL HIGH (ref 70–99)

## 2014-08-12 LAB — CULTURE, BLOOD (ROUTINE X 2)
Culture: NO GROWTH
Culture: NO GROWTH

## 2014-08-12 LAB — PROTIME-INR
INR: 2.6 — ABNORMAL HIGH (ref 0.00–1.49)
Prothrombin Time: 28 s — ABNORMAL HIGH (ref 11.6–15.2)

## 2014-08-12 MED ORDER — WARFARIN SODIUM 7.5 MG PO TABS
7.5000 mg | ORAL_TABLET | Freq: Once | ORAL | Status: AC
  Administered 2014-08-12: 7.5 mg via ORAL
  Filled 2014-08-12: qty 1
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
## 2014-08-12 NOTE — Clinical Social Work Note (Signed)
Per wife's request, FMLA documentation has been faxed to [employer] at [phone]. Fax confirmation given to patient.    Roddie McBryant Amerigo Mcglory MSW, [place]CSWA, [place], [phone]
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
## 2014-08-13 ENCOUNTER — Encounter (HOSPITAL_COMMUNITY): Payer: Self-pay | Admitting: Orthopedic Surgery

## 2014-08-13 LAB — PROTIME-INR
INR: 3.29 — ABNORMAL HIGH (ref 0.00–1.49)
Prothrombin Time: 33.7 s — ABNORMAL HIGH (ref 11.6–15.2)

## 2014-08-13 LAB — GLUCOSE, CAPILLARY
Glucose-Capillary: 97 mg/dL (ref 70–99)
Glucose-Capillary: 87 mg/dL (ref 70–99)
Glucose-Capillary: 115 mg/dL — ABNORMAL HIGH (ref 70–99)
Glucose-Capillary: 115 mg/dL — ABNORMAL HIGH (ref 70–99)

## 2014-08-13 MED ORDER — OXYCODONE HCL 5 MG PO TABS
5.0000 mg | ORAL_TABLET | ORAL | Status: DC | PRN
  Administered 2014-08-13 – 2014-08-14 (×9): 15 mg via ORAL
  Filled 2014-08-13 (×9): qty 3
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
## 2014-08-13 NOTE — Progress Notes (Signed)
SLP Cancellation Note  Patient Details Name: Marcus [name] MRN: [id number] DOB: [birth year]   Cancelled treatment:       Reason Eval/Treat Not Completed: Other (comment) (patient working with PT ).  Attempted to see patient this am; however, patient was working with PT.  Will follow up as able.   Fae PippinMelissa Jericha Bryden, M.A., CCC-SLP [phone]  Marcus [name] 08/13/2014, 10:45 AM

## 2014-08-14 LAB — PROTIME-INR
INR: 3.07 — ABNORMAL HIGH (ref 0.00–1.49)
Prothrombin Time: 31.9 s — ABNORMAL HIGH (ref 11.6–15.2)

## 2014-08-14 LAB — GLUCOSE, CAPILLARY
Glucose-Capillary: 82 mg/dL (ref 70–99)
Glucose-Capillary: 109 mg/dL — ABNORMAL HIGH (ref 70–99)
Glucose-Capillary: 109 mg/dL — ABNORMAL HIGH (ref 70–99)

## 2014-08-14 MED ORDER — OXYCODONE-ACETAMINOPHEN 7.5-325 MG PO TABS: 1.0000 | ORAL_TABLET | ORAL | Status: DC | PRN

## 2014-08-14 MED ORDER — TRAMADOL HCL 50 MG PO TABS: 100.0000 mg | ORAL_TABLET | Freq: Four times a day (QID) | ORAL | Status: DC

## 2014-08-14 MED ORDER — WARFARIN SODIUM 7.5 MG PO TABS
7.5000 mg | ORAL_TABLET | Freq: Once | ORAL | Status: AC
  Administered 2014-08-14: 7.5 mg via ORAL
  Filled 2014-08-14: qty 1

## 2014-08-14 MED ORDER — HYDROXYZINE HCL 25 MG PO TABS: 25.0000 mg | ORAL_TABLET | ORAL | Status: DC | PRN

## 2014-08-14 MED ORDER — TIZANIDINE HCL 4 MG PO TABS: 4.0000 mg | ORAL_TABLET | Freq: Three times a day (TID) | ORAL | Status: DC | PRN

## 2014-08-14 MED ORDER — INSULIN GLARGINE 100 UNIT/ML SOLOSTAR PEN: 23.0000 [IU] | PEN_INJECTOR | Freq: Every day | SUBCUTANEOUS | Status: DC

## 2014-08-14 MED ORDER — WARFARIN SODIUM 5 MG PO TABS: 7.5000 mg | ORAL_TABLET | Freq: Once | ORAL | Status: DC

## 2014-08-14 NOTE — Care Management (Signed)
Advanced aware patient discharging to home today , will bring DME to room prior to discharge . Adding HHRN for PT/INR tomorrow 08-15-14 evening call results to Dr Carola FrostHandy. Ronny FlurryHeather Nickola Lenig RN BSN  Ronny FlurryHeather Oluwateniola Leitch RN BSN
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
## 2014-08-14 NOTE — Progress Notes (Signed)
ANTICOAGULATION CONSULT NOTE - Follow Up Consult  Pharmacy Consult for Coumadin Indication: VTE prophylaxis  Allergies  Allergen Reactions  . Vicodin [Hydrocodone-Acetaminophen] Hives and Itching    Patient Measurements: Height: 5\' 7"  (170.2 cm) Weight: 260 lb (117.935 kg) IBW/kg (Calculated) : 66.1 Heparin Dosing Weight:    Vital Signs: Temp: 98.9 F (37.2 C) (04/01 0538) BP: 123/69 mmHg (04/01 0538) Pulse Rate: 104 (04/01 0538)  Labs:  Recent Labs  08/12/14 0430 08/13/14 0710 08/14/14 0630  LABPROT 28.0* 33.7* 31.9*  INR 2.60* 3.29* 3.07*    Estimated Creatinine Clearance: 109.7 mL/min (by C-G formula based on Cr of 1.11).   Assessment: [age] YOM who continues on warfarin for VTE prophylaxis s/p ortho surgeries. INR today remains slightly SUPRAtherapeutic however trending down after holding yesterday's dose (INR 3.07 << 3.29, goal of 2-3). Will restart doses this evening to help prevent a large drop in INR and will monitor trends closely.    The patient has been educated on warfarin this admission.   Goal of Therapy:  INR 2-3 Monitor platelets by anticoagulation protocol: Yes   Plan:  1. Warfarin 7.5 mg x 1 dose at 1800 today 2. Will continue to monitor for any signs/symptoms of bleeding and will follow up with PT/INR in the a.m.   Georgina PillionElizabeth Nichole Neyer, PharmD, BCPS Clinical Pharmacist Pager: [phone] 08/14/2014 1:45 PM
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
## 2014-08-14 NOTE — Progress Notes (Signed)
Orthopedic Tech Progress Note Patient Details:  Marcus [name] [birth year] [id number] Splint applied after surgery removed and replaced with new splint. (dorsal volar as applied in sx) Ortho Devices Type of Ortho Device: Wrist splint, Other (comment) Ortho Device/Splint Location: Dorsal Volar RUE Ortho Device/Splint Interventions: Application   Asia R Thompson 08/14/2014, 12:40 PM
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
## 2014-08-21 ENCOUNTER — Other Ambulatory Visit: Payer: Self-pay | Admitting: Family Medicine

## 2014-08-21 MED ORDER — BLOOD GLUCOSE MONITOR KIT: PACK | Status: DC

## 2014-08-24 ENCOUNTER — Telehealth: Payer: Self-pay | Admitting: Family Medicine
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
## 2014-08-24 NOTE — Telephone Encounter (Deleted)
[facility] Patient noted to have some "thin watery" drainage with a tinge of blood coming from his surgical site. Patient became nervous and asked wife to call EMS as he's still on warfarin. Shon HaleMary Beth has received

## 2014-08-25 NOTE — Telephone Encounter (Signed)
Spoke to care Production designer, theatre/television/filmmanager from [facility]. States that she does not see where the pt went to the ED. Per HH-PT, Shon HaleMary Beth, EMS was called and had arrived as she was leaving. Dr. Daphine DeutscherMartin (pharmacist) with Dr. Carola FrostHandy reportedly monitoring patient's INR/PT. Unable to get into contact with patient currently.  [name] S. Dorsey, MD [facility] Resident  08/25/2014, 1:52 PM

## 2014-09-07 ENCOUNTER — Other Ambulatory Visit (INDEPENDENT_AMBULATORY_CARE_PROVIDER_SITE_OTHER): Payer: Self-pay | Admitting: Orthopedic Surgery

## 2014-09-07 ENCOUNTER — Telehealth: Payer: Self-pay | Admitting: Family Medicine

## 2014-09-08 NOTE — Telephone Encounter (Signed)
Opened in error. Marcus [name]  

## 2014-09-09 ENCOUNTER — Ambulatory Visit
Admission: RE | Admit: 2014-09-09 | Discharge: 2014-09-09 | Disposition: A | Discharge status: Home / Self Care | Payer: BLUE CROSS/BLUE SHIELD | Source: Ambulatory Visit | Attending: Orthopedic Surgery | Admitting: Orthopedic Surgery

## 2014-09-09 ENCOUNTER — Other Ambulatory Visit: Payer: Self-pay | Admitting: Orthopedic Surgery

## 2014-09-09 DIAGNOSIS — M7989 Other specified soft tissue disorders: Principal | ICD-10-CM

## 2014-09-09 DIAGNOSIS — M79662 Pain in left lower leg: Secondary | ICD-10-CM

## 2014-09-11 ENCOUNTER — Ambulatory Visit: Payer: BLUE CROSS/BLUE SHIELD | Admitting: Family Medicine

## 2014-09-11 ENCOUNTER — Telehealth: Payer: Self-pay | Admitting: Family Medicine

## 2014-09-11 DIAGNOSIS — I10 Essential (primary) hypertension: Secondary | ICD-10-CM

## 2014-09-11 MED ORDER — LISINOPRIL 10 MG PO TABS: 10.0000 mg | ORAL_TABLET | Freq: Every day | ORAL | Status: DC

## 2014-09-11 MED ORDER — METFORMIN HCL 500 MG PO TABS: 1000.0000 mg | ORAL_TABLET | Freq: Two times a day (BID) | ORAL | Status: DC

## 2014-09-11 NOTE — Telephone Encounter (Signed)
I refilled the patient's lisinopril and metformin. Please let the patient know he needs to get refills on his oxycodone and warfarin from the surgeon who is monitoring his INR and adjusting his dose.  Thanks, [name] S. Dorsey, MD [facility] Resident  09/11/2014, 5:12 PM

## 2014-09-11 NOTE — Telephone Encounter (Signed)
Pt had an appointment at 3:00 pm and was 17 minutes late so he had to reschedule. He was needing refills on his Metformin, Oxycodone, Warfarin, and Lisinopril. jw

## 2014-09-14 NOTE — Telephone Encounter (Signed)
LMOVM for pt to call us back. Marcus [name], CMA  

## 2014-09-17 ENCOUNTER — Telehealth: Payer: Self-pay | Admitting: Family Medicine

## 2014-09-17 NOTE — Telephone Encounter (Signed)
PT with [facility] needs orders for continued visits for one visit per week for 4 week And 2 as needed visits

## 2014-09-18 ENCOUNTER — Encounter: Payer: Self-pay | Admitting: Family Medicine

## 2014-09-18 ENCOUNTER — Ambulatory Visit (INDEPENDENT_AMBULATORY_CARE_PROVIDER_SITE_OTHER): Payer: BLUE CROSS/BLUE SHIELD | Admitting: Family Medicine

## 2014-09-18 VITALS — BP 113/70 | HR 100 | Temp 98.3°F | Ht 67.0 in | Wt 228.0 lb

## 2014-09-18 DIAGNOSIS — I10 Essential (primary) hypertension: Secondary | ICD-10-CM

## 2014-09-18 DIAGNOSIS — E119 Type 2 diabetes mellitus without complications: Secondary | ICD-10-CM

## 2014-09-18 MED ORDER — "INSULIN SYRINGE 31G X 5/16"" 0.5 ML MISC": 1.0000 | Freq: Every day | Status: DC

## 2014-09-18 MED ORDER — INSULIN GLARGINE 100 UNIT/ML ~~LOC~~ SOLN: 23.0000 [IU] | Freq: Every day | SUBCUTANEOUS | Status: DC
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
## 2014-09-18 NOTE — Assessment & Plan Note (Signed)
Blood sugars currently at goal for the patient's records. -Continue Lantus 23 units daily at bedtime and metformin 1000 mg twice a day -Patient to return in 2 months at that time we'll will get a repeat A1c

## 2014-09-18 NOTE — Assessment & Plan Note (Signed)
Currently stable and within normal limits -Continue lisinopril 10 mg daily

## 2014-09-18 NOTE — Patient Instructions (Signed)
It was good to meet you  The office will call you when the paperwork is completed  Please follow up with me in 3 months or sooner as necessary.  Insulin Glargine injection What is this medicine? INSULIN GLARGINE (IN su lin GLAR geen) is a human-made form of insulin. This drug lowers the amount of sugar in your blood. It is a long-acting insulin that is usually given once a day. This medicine may be used for other purposes; ask your health care provider or pharmacist if you have questions. COMMON BRAND NAME(S): Lantus, Lantus SoloStar, Toujeo SoloStar What should I tell my health care provider before I take this medicine? They need to know if you have any of these conditions: -episodes of hypoglycemia -kidney disease -liver disease -an unusual or allergic reaction to insulin, metacresol, other medicines, foods, dyes, or preservatives -pregnant or trying to get pregnant -breast-feeding How should I use this medicine? This medicine is for injection under the skin. Use this medicine at the same time each day. Use exactly as directed. This insulin should never be mixed in the same syringe with other insulins before injection. Do not vigorously shake before use. You will be taught how to use this medicine and how to adjust doses for activities and illness. Do not use more insulin than prescribed. Always check the appearance of your insulin before using it. This medicine should be clear and colorless like water. Do not use it if it is cloudy, thickened, colored, or has solid particles in it. It is important that you put your used needles and syringes in a special sharps container. Do not put them in a trash can. If you do not have a sharps container, call your pharmacist or healthcare provider to get one. Talk to your pediatrician regarding the use of this medicine in children. Special care may be needed. Overdosage: If you think you have taken too much of this medicine contact a poison control center  or emergency room at once. NOTE: This medicine is only for you. Do not share this medicine with others. What if I miss a dose? It is important not to miss a dose. Your health care professional or doctor should discuss a plan for missed doses with you. If you do miss a dose, follow their plan. Do not take double doses. What may interact with this medicine? -other medicines for diabetes Many medications may cause an increase or decrease in blood sugar, these include: -alcohol containing beverages -aspirin and aspirin-like drugs -chloramphenicol -chromium -diuretics -male hormones, like estrogens or progestins and birth control pills -heart medicines -isoniazid -male hormones or anabolic steroids -medicines for weight loss -medicines for allergies, asthma, cold, or cough -medicines for mental problems -medicines called MAO Inhibitors like Nardil, Parnate, Marplan, Eldepryl -niacin -NSAIDs, medicines for pain and inflammation, like ibuprofen or naproxen -pentamidine -phenytoin -probenecid -quinolone antibiotics like ciprofloxacin, levofloxacin, ofloxacin -some herbal dietary supplements -steroid medicines like prednisone or cortisone -thyroid medicine Some medications can hide the warning symptoms of low blood sugar. You may need to monitor your blood sugar more closely if you are taking one of these medications. These include: -beta-blockers such as atenolol, metoprolol, propranolol -clonidine -guanethidine -reserpine This list may not describe all possible interactions. Give your health care provider a list of all the medicines, herbs, non-prescription drugs, or dietary supplements you use. Also tell them if you smoke, drink alcohol, or use illegal drugs. Some items may interact with your medicine. What should I watch for while using this medicine?  Visit your health care professional or doctor for regular checks on your progress. A test called the HbA1C (A1C) will be monitored.  This is a simple blood test. It measures your blood sugar control over the last 2 to 3 months. You will receive this test every 3 to 6 months. Learn how to check your blood sugar. Learn the symptoms of low and high blood sugar and how to manage them. Always carry a quick-source of sugar with you in case you have symptoms of low blood sugar. Examples include hard sugar candy or glucose tablets. Make sure others know that you can choke if you eat or drink when you develop serious symptoms of low blood sugar, such as seizures or unconsciousness. They must get medical help at once. Tell your doctor or health care professional if you have high blood sugar. You might need to change the dose of your medicine. If you are sick or exercising more than usual, you might need to change the dose of your medicine. Do not skip meals. Ask your doctor or health care professional if you should avoid alcohol. Many nonprescription cough and cold products contain sugar or alcohol. These can affect blood sugar. Make sure that you have the right kind of syringe for the type of insulin you use. Try not to change the brand and type of insulin or syringe unless your health care professional or doctor tells you to. Switching insulin brand or type can cause dangerously high or low blood sugar. Always keep an extra supply of insulin, syringes, and needles on hand. Use a syringe one time only. Throw away syringe and needle in a closed container to prevent accidental needle sticks. Insulin pens and cartridges should never be shared. Even if the needle is changed, sharing may result in passing of viruses like hepatitis or HIV. Wear a medical ID bracelet or chain, and carry a card that describes your disease and details of your medicine and dosage times. What side effects may I notice from receiving this medicine? Side effects that you should report to your health care professional or doctor as soon as possible: -allergic reactions like  skin rash, itching or hives, swelling of the face, lips, or tongue -breathing problems -signs and symptoms of high blood sugar such as dizziness, dry mouth, dry skin, fruity breath, nausea, stomach pain, increased hunger or thirst, increased urination -signs and symptoms of low blood sugar such as feeling anxious, confusion, dizziness, increased hunger, unusually weak or tired, sweating, shakiness, cold, irritable, headache, blurred vision, fast heartbeat, loss of consciousness Side effects that usually do not require medical attention (report to your health care professional or doctor if they continue or are bothersome): -increase or decrease in fatty tissue under the skin due to overuse of a particular injection site -itching, burning, swelling, or rash at site where injected This list may not describe all possible side effects. Call your doctor for medical advice about side effects. You may report side effects to FDA at 1-800-FDA-1088. Where should I keep my medicine? Keep out of the reach of children. Store unopened vials in a refrigerator between 2 and 8 degrees C (36 and 46 degrees F). Do not freeze or use if the insulin has been frozen. Opened vials (vials currently in use) may be stored in the refrigerator or at room temperature, at approximately 25 degrees C (77 degrees F) or cooler. Keeping your insulin at room temperature decreases the amount of pain during injection. Once opened, your insulin can be  used for 28 days. After 28 days, the vial should be thrown away. Store unopened pen-injector cartridges in a refrigerator between 2 and 8 degrees C (36 and 46 degrees F.) Do not freeze or use if the insulin has been frozen. Insulin cartridges inserted into the [employer] system should be kept at room temperature, approximately 25 degrees C (77 degrees F) or cooler. Do not store in the refrigerator. Once inserted into the [employer] system, the insulin can be used for 28 days. After 28 days, the  cartridge should be thrown away. Protect from light and excessive heat. Throw away any unused medicine after the expiration date or after the specified time for room temperature storage has passed. NOTE: This sheet is a summary. It may not cover all possible information. If you have questions about this medicine, talk to your doctor, pharmacist, or health care provider.  2015, [employer] Standard. (2013-07-10 10:13:56)
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
## 2014-09-23 ENCOUNTER — Telehealth: Payer: Self-pay | Admitting: *Deleted

## 2014-09-23 ENCOUNTER — Encounter: Payer: Self-pay | Admitting: Radiation Oncology

## 2014-09-23 NOTE — Telephone Encounter (Signed)
Left message on voicemail with message from MD, asked that patient call back with any questions.

## 2014-09-23 NOTE — Telephone Encounter (Signed)
-----   Message from [name] S Dorsey, MD sent at 09/22/2014 11:32 AM EDT ----- Will you please call the pt and ask them to have all the paperwork for [employer] faxed over/brought over. They only provided me with pages 8-12 (and I need to see pages 1-7 in order to complete everything).  Thanks, [employer]
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
## 2014-09-24 ENCOUNTER — Encounter: Payer: Self-pay | Admitting: Family Medicine

## 2014-09-24 NOTE — Progress Notes (Signed)
Patient dropped off papers to be filled out for disability.  Please call when completed.

## 2014-09-25 ENCOUNTER — Encounter (HOSPITAL_BASED_OUTPATIENT_CLINIC_OR_DEPARTMENT_OTHER): Payer: Self-pay | Admitting: *Deleted

## 2014-09-25 NOTE — Progress Notes (Signed)
Forms placed in PCP box. 

## 2014-09-28 ENCOUNTER — Encounter: Payer: Self-pay | Admitting: *Deleted

## 2014-09-28 ENCOUNTER — Encounter (HOSPITAL_BASED_OUTPATIENT_CLINIC_OR_DEPARTMENT_OTHER): Payer: Self-pay | Admitting: *Deleted

## 2014-09-28 ENCOUNTER — Telehealth: Payer: Self-pay | Admitting: Family Medicine

## 2014-09-28 ENCOUNTER — Ambulatory Visit: Payer: BLUE CROSS/BLUE SHIELD | Admitting: Endocrinology

## 2014-09-28 DIAGNOSIS — Z0289 Encounter for other administrative examinations: Secondary | ICD-10-CM
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
## 2014-09-30 NOTE — Progress Notes (Signed)
Pt informed that forms are complete and ready for pickup.  Forms copied for scanning in patient's record.  Martin, Tamika L, RN  

## 2014-10-01 ENCOUNTER — Encounter (HOSPITAL_BASED_OUTPATIENT_CLINIC_OR_DEPARTMENT_OTHER): Payer: Self-pay | Admitting: *Deleted

## 2014-10-01 ENCOUNTER — Ambulatory Visit (HOSPITAL_BASED_OUTPATIENT_CLINIC_OR_DEPARTMENT_OTHER): Payer: BLUE CROSS/BLUE SHIELD | Admitting: Anesthesiology

## 2014-10-01 ENCOUNTER — Encounter (HOSPITAL_BASED_OUTPATIENT_CLINIC_OR_DEPARTMENT_OTHER)
Admission: RE | Disposition: A | Discharge status: Home / Self Care | Payer: Self-pay | Source: Ambulatory Visit | Attending: Orthopedic Surgery

## 2014-10-01 ENCOUNTER — Ambulatory Visit (HOSPITAL_BASED_OUTPATIENT_CLINIC_OR_DEPARTMENT_OTHER)
Admission: RE | Admit: 2014-10-01 | Discharge: 2014-10-01 | Disposition: A | Discharge status: Home / Self Care | Payer: BLUE CROSS/BLUE SHIELD | Source: Ambulatory Visit | Attending: Orthopedic Surgery | Admitting: Orthopedic Surgery

## 2014-10-01 DIAGNOSIS — L980 Pyogenic granuloma: Secondary | ICD-10-CM | POA: Insufficient documentation

## 2014-10-01 DIAGNOSIS — I10 Essential (primary) hypertension: Secondary | ICD-10-CM | POA: Diagnosis not present

## 2014-10-01 DIAGNOSIS — E119 Type 2 diabetes mellitus without complications: Secondary | ICD-10-CM | POA: Insufficient documentation

## 2014-10-01 DIAGNOSIS — E559 Vitamin D deficiency, unspecified: Secondary | ICD-10-CM | POA: Diagnosis not present

## 2014-10-01 DIAGNOSIS — E669 Obesity, unspecified: Secondary | ICD-10-CM | POA: Diagnosis not present

## 2014-10-01 DIAGNOSIS — S62101D Fracture of unspecified carpal bone, right wrist, subsequent encounter for fracture with routine healing: Secondary | ICD-10-CM | POA: Insufficient documentation

## 2014-10-01 DIAGNOSIS — Z79899 Other long term (current) drug therapy: Secondary | ICD-10-CM | POA: Insufficient documentation

## 2014-10-01 DIAGNOSIS — S63301D Traumatic rupture of unspecified ligament of right wrist, subsequent encounter: Secondary | ICD-10-CM

## 2014-10-01 DIAGNOSIS — Z6833 Body mass index (BMI) 33.0-33.9, adult: Secondary | ICD-10-CM | POA: Diagnosis not present

## 2014-10-01 HISTORY — DX: Personal history of (healed) traumatic fracture: Z87.81

## 2014-10-01 HISTORY — DX: Type 2 diabetes mellitus without complications: E11.9

## 2014-10-01 HISTORY — PX: HARDWARE REMOVAL: SHX979

## 2014-10-01 LAB — POCT I-STAT 4, (NA,K, GLUC, HGB,HCT)
Glucose, Bld: 112 mg/dL — ABNORMAL HIGH (ref 65–99)
HCT: 39 % (ref 39.0–52.0)
Hemoglobin: 13.3 g/dL (ref 13.0–17.0)
Potassium: 4.1 mmol/L (ref 3.5–5.1)
Sodium: 139 mmol/L (ref 135–145)

## 2014-10-01 LAB — GLUCOSE, CAPILLARY: Glucose-Capillary: 101 mg/dL — ABNORMAL HIGH (ref 65–99)

## 2014-10-01 SURGERY — REMOVAL, HARDWARE
Anesthesia: General | Site: Wrist | Laterality: Right

## 2014-10-01 MED ORDER — SODIUM CHLORIDE 0.9 % IR SOLN
Status: DC | PRN
  Administered 2014-10-01: 500 mL

## 2014-10-01 MED ORDER — MIDAZOLAM HCL 5 MG/5ML IJ SOLN
INTRAMUSCULAR | Status: DC | PRN
  Administered 2014-10-01: 2 mg via INTRAVENOUS

## 2014-10-01 MED ORDER — FENTANYL CITRATE (PF) 100 MCG/2ML IJ SOLN
INTRAMUSCULAR | Status: DC | PRN
  Administered 2014-10-01 (×3): 50 ug via INTRAVENOUS

## 2014-10-01 MED ORDER — ONDANSETRON HCL 4 MG/2ML IJ SOLN
INTRAMUSCULAR | Status: DC | PRN
  Administered 2014-10-01: 4 mg via INTRAVENOUS

## 2014-10-01 MED ORDER — CEFAZOLIN SODIUM-DEXTROSE 2-3 GM-% IV SOLR
INTRAVENOUS | Status: AC
  Filled 2014-10-01: qty 50

## 2014-10-01 MED ORDER — OXYCODONE-ACETAMINOPHEN 5-325 MG PO TABS
ORAL_TABLET | ORAL | Status: AC
  Filled 2014-10-01: qty 1

## 2014-10-01 MED ORDER — LACTATED RINGERS IV SOLN
INTRAVENOUS | Status: DC
  Filled 2014-10-01: qty 1000

## 2014-10-01 MED ORDER — LACTATED RINGERS IV SOLN
INTRAVENOUS | Status: DC
  Administered 2014-10-01: 14:00:00 via INTRAVENOUS
  Filled 2014-10-01: qty 1000

## 2014-10-01 MED ORDER — FENTANYL CITRATE (PF) 100 MCG/2ML IJ SOLN
INTRAMUSCULAR | Status: AC
  Filled 2014-10-01: qty 2

## 2014-10-01 MED ORDER — PROPOFOL 10 MG/ML IV BOLUS
INTRAVENOUS | Status: DC | PRN
  Administered 2014-10-01: 200 mg via INTRAVENOUS

## 2014-10-01 MED ORDER — OXYCODONE HCL 5 MG PO TABS
ORAL_TABLET | ORAL | Status: AC
  Filled 2014-10-01: qty 1

## 2014-10-01 MED ORDER — FENTANYL CITRATE (PF) 100 MCG/2ML IJ SOLN
INTRAMUSCULAR | Status: AC
  Filled 2014-10-01: qty 4

## 2014-10-01 MED ORDER — FENTANYL CITRATE (PF) 100 MCG/2ML IJ SOLN
25.0000 ug | INTRAMUSCULAR | Status: DC | PRN
Start: 2014-10-01 — End: 2014-10-01
  Administered 2014-10-01: 50 ug via INTRAVENOUS
  Filled 2014-10-01: qty 1

## 2014-10-01 MED ORDER — ACETAMINOPHEN 10 MG/ML IV SOLN
INTRAVENOUS | Status: DC | PRN
  Administered 2014-10-01: 1000 mg via INTRAVENOUS

## 2014-10-01 MED ORDER — OXYCODONE HCL 5 MG PO TABS
5.0000 mg | ORAL_TABLET | Freq: Once | ORAL | Status: AC
  Administered 2014-10-01: 2.5 mg via ORAL
  Filled 2014-10-01: qty 1

## 2014-10-01 MED ORDER — MIDAZOLAM HCL 2 MG/2ML IJ SOLN
INTRAMUSCULAR | Status: AC
  Filled 2014-10-01: qty 2

## 2014-10-01 MED ORDER — CEFAZOLIN SODIUM-DEXTROSE 2-3 GM-% IV SOLR
2.0000 g | INTRAVENOUS | Status: AC
  Administered 2014-10-01: 2 g via INTRAVENOUS
  Filled 2014-10-01: qty 50

## 2014-10-01 MED ORDER — OXYCODONE-ACETAMINOPHEN 5-325 MG PO TABS
1.0000 | ORAL_TABLET | ORAL | Status: DC | PRN
  Administered 2014-10-01: 1 via ORAL
  Filled 2014-10-01: qty 2

## 2014-10-01 MED ORDER — EPHEDRINE SULFATE 50 MG/ML IJ SOLN
INTRAMUSCULAR | Status: DC | PRN
  Administered 2014-10-01: 15 mg via INTRAVENOUS
  Administered 2014-10-01: 10 mg via INTRAVENOUS

## 2014-10-01 MED ORDER — OXYCODONE-ACETAMINOPHEN 7.5-325 MG PO TABS: 1.0000 | ORAL_TABLET | ORAL | Status: DC | PRN

## 2014-10-01 MED ORDER — LIDOCAINE HCL (CARDIAC) 20 MG/ML IV SOLN
INTRAVENOUS | Status: DC | PRN
  Administered 2014-10-01: 80 mg via INTRAVENOUS

## 2014-10-01 MED ORDER — CHLORHEXIDINE GLUCONATE 4 % EX LIQD
60.0000 mL | Freq: Once | CUTANEOUS | Status: DC
  Filled 2014-10-01: qty 60

## 2014-10-01 MED ORDER — OXYCODONE-ACETAMINOPHEN 7.5-325 MG PO TABS
1.0000 | ORAL_TABLET | ORAL | Status: DC | PRN
  Filled 2014-10-01: qty 1

## 2014-10-01 SURGICAL SUPPLY — 55 items
BANDAGE ELASTIC 4 VELCRO ST LF (GAUZE/BANDAGES/DRESSINGS) ×2 IMPLANT
BLADE SURG 15 STRL LF DISP TIS (BLADE) ×1 IMPLANT
BLADE SURG 15 STRL SS (BLADE) ×1
BNDG ESMARK 4X9 LF (GAUZE/BANDAGES/DRESSINGS) ×2 IMPLANT
BNDG GAUZE ELAST 4 BULKY (GAUZE/BANDAGES/DRESSINGS) ×2 IMPLANT
CANISTER SUCT 1200ML W/VALVE (MISCELLANEOUS) IMPLANT
CORDS BIPOLAR (ELECTRODE) ×2 IMPLANT
COVER BACK TABLE 60X90IN (DRAPES) ×2 IMPLANT
CUFF TOURNIQUET SINGLE 18IN (TOURNIQUET CUFF) ×2 IMPLANT
DECANTER SPIKE VIAL GLASS SM (MISCELLANEOUS) IMPLANT
DRAPE EXTREMITY T 121X128X90 (DRAPE) ×2 IMPLANT
DRAPE LG THREE QUARTER DISP (DRAPES) ×2 IMPLANT
DRAPE OEC MINIVIEW 54X84 (DRAPES) ×2 IMPLANT
DRSG EMULSION OIL 3X3 NADH (GAUZE/BANDAGES/DRESSINGS) IMPLANT
GAUZE XEROFORM 1X8 LF (GAUZE/BANDAGES/DRESSINGS) ×2 IMPLANT
GLOVE BIOGEL PI IND STRL 8.5 (GLOVE) ×1 IMPLANT
GLOVE BIOGEL PI INDICATOR 8.5 (GLOVE) ×1
GLOVE SURG ORTHO 8.0 STRL STRW (GLOVE) ×2 IMPLANT
GOWN STRL REUS W/ TWL LRG LVL3 (GOWN DISPOSABLE) ×1 IMPLANT
GOWN STRL REUS W/TWL LRG LVL3 (GOWN DISPOSABLE) ×1
NEEDLE HYPO 25X1 1.5 SAFETY (NEEDLE) IMPLANT
NS IRRIG 1000ML POUR BTL (IV SOLUTION) IMPLANT
PACK BASIN DAY SURGERY FS (CUSTOM PROCEDURE TRAY) ×2 IMPLANT
PAD CAST 3X4 CTTN HI CHSV (CAST SUPPLIES) ×1 IMPLANT
PAD CAST 4YDX4 CTTN HI CHSV (CAST SUPPLIES) ×2 IMPLANT
PADDING CAST ABS 4INX4YD NS (CAST SUPPLIES)
PADDING CAST ABS COTTON 4X4 ST (CAST SUPPLIES) IMPLANT
PADDING CAST COTTON 3X4 STRL (CAST SUPPLIES) ×1
PADDING CAST COTTON 4X4 STRL (CAST SUPPLIES) ×2
SLEEVE SCD COMPRESS KNEE MED (MISCELLANEOUS) IMPLANT
SLING ARM LRG ADULT FOAM STRAP (SOFTGOODS) IMPLANT
SLING ARM MED ADULT FOAM STRAP (SOFTGOODS) IMPLANT
SPLINT FIBERGLASS 3X35 (CAST SUPPLIES) ×2 IMPLANT
SPLINT FIBERGLASS 4X30 (CAST SUPPLIES) IMPLANT
SPONGE GAUZE 4X4 12PLY (GAUZE/BANDAGES/DRESSINGS) ×2 IMPLANT
SPONGE GAUZE 4X4 12PLY STER LF (GAUZE/BANDAGES/DRESSINGS) ×2 IMPLANT
STOCKINETTE 4X48 STRL (DRAPES) ×2 IMPLANT
SUCTION FRAZIER TIP 10 FR DISP (SUCTIONS) IMPLANT
SUT MNCRL AB 3-0 PS2 18 (SUTURE) IMPLANT
SUT MON AB 3-0 SH 27 (SUTURE)
SUT MON AB 3-0 SH27 (SUTURE) IMPLANT
SUT PROLENE 3 0 PS 1 (SUTURE) IMPLANT
SUT PROLENE 4 0 PS 2 18 (SUTURE) IMPLANT
SUT VIC AB 0 CT1 27 (SUTURE)
SUT VIC AB 0 CT1 27XBRD ANBCTR (SUTURE) IMPLANT
SUT VIC AB 2-0 PS2 27 (SUTURE) IMPLANT
SUT VIC AB 2-0 SH 27 (SUTURE)
SUT VIC AB 2-0 SH 27XBRD (SUTURE) IMPLANT
SUT VICRYL 4-0 PS2 18IN ABS (SUTURE) IMPLANT
SYR BULB 3OZ (MISCELLANEOUS) ×2 IMPLANT
SYR CONTROL 10ML LL (SYRINGE) IMPLANT
TOWEL OR 17X24 6PK STRL BLUE (TOWEL DISPOSABLE) ×2 IMPLANT
TRAY DSU PREP LF (CUSTOM PROCEDURE TRAY) ×2 IMPLANT
TUBE CONNECTING 12X1/4 (SUCTIONS) IMPLANT
UNDERPAD 30X30 INCONTINENT (UNDERPADS AND DIAPERS) ×2 IMPLANT

## 2014-10-01 NOTE — Discharge Instructions (Signed)
KEEP BANDAGE CLEAN AND DRY °CALL OFFICE FOR F/U APPT [phone] in 14 days °KEEP HAND ELEVATED ABOVE HEART °OK TO APPLY ICE TO OPERATIVE AREA °CONTACT OFFICE IF ANY WORSENING PAIN OR CONCERNS. ° ° ° °      HAND SURGERY ° °  HOME CARE INSTRUCTIONS ° ° ° °The following instructions have been prepared to help you care for yourself upon your return home today. ° °Wound Care:  °Keep your hand elevated above the level of your heart. Do not allow it to dangle by your side. Keep the dressing dry and do not remove it unless your doctor advises you to do so. He will usually change it at the time of you post-op visit. Moving your fingers is advised to stimulate circulation but will depend on the site of your surgery. Of course, if you have a splint applied your doctor will advise you about movement. ° °Activity:  °Do not drive or operate machinery today. Rest today and then you may return to your normal activity and work as indicated by your physician. ° °Diet: °Drink liquids today or eat a light diet. You may resume a regular diet tomorrow. ° °General expectations: °Pain for two or three days. °Fingers may become slightly swollen.  ° °Unexpected Observations- Call your doctor if any of these occur: °Severe pain not relieved by pain medication. °Elevated temperature. °Dressing soaked with blood. °Inability to move fingers. °White or bluish color to fingers. ° ° ° ° °Post Anesthesia Home Care Instructions ° °Activity: °Get plenty of rest for the remainder of the day. A responsible adult should stay with you for 24 hours following the procedure.  °For the next 24 hours, DO NOT: °-Drive a car °-Operate machinery °-Drink alcoholic beverages °-Take any medication unless instructed by your physician °-Make any legal decisions or sign important papers. ° °Meals: °Start with liquid foods such as gelatin or soup. Progress to regular foods as tolerated. Avoid greasy, spicy, heavy foods. If nausea and/or vomiting occur, drink only clear  liquids until the nausea and/or vomiting subsides. Call your physician if vomiting continues. ° °Special Instructions/Symptoms: °Your throat may feel dry or sore from the anesthesia or the breathing tube placed in your throat during surgery. If this causes discomfort, gargle with warm salt water. The discomfort should disappear within 24 hours. ° °If you had a scopolamine patch placed behind your ear for the management of post- operative nausea and/or vomiting: ° °1. The medication in the patch is effective for 72 hours, after which it should be removed.  Wrap patch in a tissue and discard in the trash. Wash hands thoroughly with soap and water. °2. You may remove the patch earlier than 72 hours if you experience unpleasant side effects which may include dry mouth, dizziness or visual disturbances. °3. Avoid touching the patch. Wash your hands with soap and water after contact with the patch. °  ° ° ° ° °

## 2014-10-01 NOTE — Anesthesia Procedure Notes (Signed)
Procedure Name: LMA Insertion Date/Time: 10/01/2014 2:46 PM Performed by: Tyrone NineSAUVE, Dickie Labarre F Pre-anesthesia Checklist: Patient identified, Timeout performed, Emergency Drugs available, Suction available and Patient being monitored Patient Re-evaluated:Patient Re-evaluated prior to inductionOxygen Delivery Method: Circle system utilized Preoxygenation: Pre-oxygenation with 100% oxygen Intubation Type: IV induction Ventilation: Mask ventilation without difficulty LMA: LMA inserted LMA Size: 5.0 Number of attempts: 1 Airway Equipment and Method: Bite block Placement Confirmation: positive ETCO2 and breath sounds checked- equal and bilateral Tube secured with: Tape Dental Injury: Teeth and Oropharynx as per pre-operative assessment

## 2014-10-01 NOTE — Transfer of Care (Signed)
Immediate Anesthesia Transfer of Care Note  Patient: Marcus [name]  Procedure(s) Performed: Procedure(s): RIGHT WRIST DEEP IMPLANT REMOVAL (Right)  Patient Location: PACU  Anesthesia Type:General  Level of Consciousness: awake, alert , oriented and patient cooperative  Airway & Oxygen Therapy: Patient Spontanous Breathing and Patient connected to nasal cannula oxygen  Post-op Assessment: Report given to RN and Post -op Vital signs reviewed and stable  Post vital signs: Reviewed and stable  Last Vitals:  Filed Vitals:   10/01/14 1250  BP: 126/87  Pulse: 125  Temp: 36.9 C  Resp: 20    Complications: No apparent anesthesia complications

## 2014-10-01 NOTE — Brief Op Note (Signed)
10/01/2014  8:07 AM  PATIENT:  Marcus [name]  39 y.o. male  PRE-OPERATIVE DIAGNOSIS:  right wrist deep implants retained  POST-OPERATIVE DIAGNOSIS:  * No post-op diagnosis entered *  PROCEDURE:  Procedure(s): RIGHT WRIST DEEP IMPLANT REMOVAL (Right)  SURGEON:  Surgeon(s) and Role:    * Bradly BienenstockFred Abb Gobert, MD - Primary  PHYSICIAN ASSISTANT:   ASSISTANTS: none   ANESTHESIA:   general  EBL:     BLOOD ADMINISTERED:none  DRAINS: none   LOCAL MEDICATIONS USED:  MARCAINE     SPECIMEN:  No Specimen  DISPOSITION OF SPECIMEN:  N/A  COUNTS:  YES  TOURNIQUET:  * No tourniquets in log *  DICTATION: .Other Dictation: Dictation Number [id number]  PLAN OF CARE: Discharge to home after PACU  PATIENT DISPOSITION:  PACU - hemodynamically stable.   Delay start of Pharmacological VTE agent (>24hrs) due to surgical blood loss or risk of bleeding: not applicable

## 2014-10-01 NOTE — Anesthesia Preprocedure Evaluation (Addendum)
Anesthesia Evaluation  Patient identified by MRN, date of birth, ID band Patient awake    Reviewed: Allergy & Precautions, H&P , NPO status , Patient's Chart, lab work & pertinent test results  Airway Mallampati: II  TM Distance: >3 FB Neck ROM: full    Dental no notable dental hx. (+) Teeth Intact, Dental Advisory Given, Missing,    Pulmonary neg pulmonary ROS,  breath sounds clear to auscultation  Pulmonary exam normal       Cardiovascular Exercise Tolerance: Good hypertension, Pt. on medications Normal cardiovascular examRhythm:regular Rate:Normal  Tachycardia normally.   Neuro/Psych negative neurological ROS  negative psych ROS   GI/Hepatic negative GI ROS, Neg liver ROS,   Endo/Other  diabetes, Well Controlled, Type 2, Oral Hypoglycemic Agents, Insulin Dependentobese  Renal/GU negative Renal ROS  negative genitourinary   Musculoskeletal   Abdominal   Peds  Hematology negative hematology ROS (+) anemia , JEHOVAH'S WITNESSPt states that he will use cell saver.  He does not want any blood products to be given, even if it would be life saving.  Family is as bedside.   Anesthesia Other Findings   Reproductive/Obstetrics negative OB ROS                          Anesthesia Physical Anesthesia Plan  ASA: III  Anesthesia Plan: General   Post-op Pain Management:    Induction: Intravenous  Airway Management Planned: LMA  Additional Equipment:   Intra-op Plan:   Post-operative Plan:   Informed Consent: I have reviewed the patients History and Physical, chart, labs and discussed the procedure including the risks, benefits and alternatives for the proposed anesthesia with the patient or authorized representative who has indicated his/her understanding and acceptance.   Dental Advisory Given  Plan Discussed with: CRNA and Surgeon  Anesthesia Plan Comments:         Anesthesia Quick  Evaluation
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
## 2014-10-02 NOTE — Anesthesia Postprocedure Evaluation (Signed)
  Anesthesia Post-op Note  Patient: Marcus [name]  Procedure(s) Performed: Procedure(s) (LRB): RIGHT WRIST DEEP IMPLANT REMOVAL (Right)  Patient Location: PACU  Anesthesia Type: General  Level of Consciousness: awake and alert   Airway and Oxygen Therapy: Patient Spontanous Breathing  Post-op Pain: mild  Post-op Assessment: Post-op Vital signs reviewed, Patient's Cardiovascular Status Stable, Respiratory Function Stable, Patent Airway and No signs of Nausea or vomiting  Last Vitals:  Filed Vitals:   10/01/14 1700  BP: 132/92  Pulse: 118  Temp: 36.7 C  Resp: 18    Post-op Vital Signs: stable   Complications: No apparent anesthesia complications
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
## 2014-10-05 ENCOUNTER — Encounter (HOSPITAL_BASED_OUTPATIENT_CLINIC_OR_DEPARTMENT_OTHER): Payer: Self-pay | Admitting: Orthopedic Surgery

## 2014-10-14 NOTE — Telephone Encounter (Signed)
Patient to contact provider at [facility]

## 2014-10-30 ENCOUNTER — Ambulatory Visit: Payer: BLUE CROSS/BLUE SHIELD | Admitting: Endocrinology

## 2014-10-30 DIAGNOSIS — Z0289 Encounter for other administrative examinations: Secondary | ICD-10-CM

## 2014-11-28 ENCOUNTER — Other Ambulatory Visit: Payer: Self-pay | Admitting: Family Medicine

## 2014-11-30 NOTE — Telephone Encounter (Signed)
Metformin refilled. Please have the patient make an appt with me so we can see how his blood sugars are doing.  Thanks, [name] S. Angeles Paolucci, MD [facility] Resident  11/30/2014, 8:16 PM

## 2014-12-29 ENCOUNTER — Encounter: Payer: Self-pay | Admitting: Family Medicine

## 2014-12-29 ENCOUNTER — Ambulatory Visit (INDEPENDENT_AMBULATORY_CARE_PROVIDER_SITE_OTHER): Payer: BLUE CROSS/BLUE SHIELD | Admitting: Family Medicine

## 2014-12-29 VITALS — BP 158/112 | HR 104 | Temp 98.1°F | Ht 67.0 in | Wt 239.0 lb

## 2014-12-29 DIAGNOSIS — I10 Essential (primary) hypertension: Secondary | ICD-10-CM

## 2014-12-29 DIAGNOSIS — J302 Other seasonal allergic rhinitis: Secondary | ICD-10-CM | POA: Diagnosis not present

## 2014-12-29 DIAGNOSIS — E119 Type 2 diabetes mellitus without complications: Secondary | ICD-10-CM | POA: Diagnosis not present

## 2014-12-29 LAB — POCT GLYCOSYLATED HEMOGLOBIN (HGB A1C): Hemoglobin A1C: 6.1

## 2014-12-29 LAB — BASIC METABOLIC PANEL
BUN: 13 mg/dL (ref 7–25)
CO2: 24 mmol/L (ref 20–31)
Calcium: 9.8 mg/dL (ref 8.6–10.3)
Chloride: 103 mmol/L (ref 98–110)
Creat: 0.74 mg/dL (ref 0.60–1.35)
Glucose, Bld: 100 mg/dL — ABNORMAL HIGH (ref 65–99)
Potassium: 4.4 mmol/L (ref 3.5–5.3)
Sodium: 139 mmol/L (ref 135–146)

## 2014-12-29 MED ORDER — LORATADINE 10 MG PO TABS: 10.0000 mg | ORAL_TABLET | Freq: Every day | ORAL | Status: DC

## 2014-12-29 MED ORDER — METFORMIN HCL 1000 MG PO TABS: 1000.0000 mg | ORAL_TABLET | Freq: Two times a day (BID) | ORAL | Status: DC

## 2014-12-29 MED ORDER — LISINOPRIL 10 MG PO TABS: 10.0000 mg | ORAL_TABLET | Freq: Every day | ORAL | Status: DC

## 2014-12-29 NOTE — Assessment & Plan Note (Signed)
Patient presenting with what appears to be headaches from seasonal allergies. Headaches are behind the eyes and maxillary. He also endorses sneezing, watery eyes, and post-nasal drip. No weakness, change in vision, or change in sensation. No infectious symptoms such as cough, fevers, or nuchal rigidity. - Claritin  daily  - RTC precautions discussed: weakness, change in vision, or change in sensation
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
## 2014-12-29 NOTE — Assessment & Plan Note (Signed)
BP not controlled today despite reported compliance. Has always been well controlled previously and pt reports normal BPs at home when his wife checks (but can't tell me numbers). States his BP is probably elevated due to pain and headaches. Pt is sure his headache occurred prior to elevated BPs and does not think this is the etiology of his headaches. No blurred vision or change in urination.  - Will continue with lisinopril  for now - BEMT today  - Patient to f/u in 2-4wks, if BP is still elevated, will need to alter his current regimen. - RTC precautions discussed.

## 2014-12-29 NOTE — Assessment & Plan Note (Signed)
Patient congratulated on decreasing A1c from 11.0 to 6.1. Stressed the importance of checking his blood sugars at least 3 times per day and keeping a log. Per patient, he has not reportedly felt any hypoglycemic spells and can tell me that he normally gets shaky if his CBG is too low.  - continue Lantus 23u for now - continue metformin  BID  - will monitor BMET today. - pt to f/u in 1 month with records of CBGs.

## 2014-12-30 ENCOUNTER — Encounter: Payer: Self-pay | Admitting: Family Medicine

## 2015-01-05 ENCOUNTER — Other Ambulatory Visit: Payer: Self-pay | Admitting: Family Medicine

## 2015-01-07 ENCOUNTER — Ambulatory Visit: Payer: BLUE CROSS/BLUE SHIELD | Admitting: Physical Therapy

## 2015-01-14 ENCOUNTER — Ambulatory Visit: Payer: BLUE CROSS/BLUE SHIELD | Attending: Orthopedic Surgery | Admitting: Physical Therapy

## 2015-01-28 ENCOUNTER — Ambulatory Visit: Payer: BLUE CROSS/BLUE SHIELD | Admitting: Family Medicine

## 2015-02-03 ENCOUNTER — Other Ambulatory Visit: Payer: Self-pay | Admitting: Family Medicine

## 2015-02-18 ENCOUNTER — Other Ambulatory Visit: Payer: Self-pay | Admitting: Orthopedic Surgery

## 2015-02-18 DIAGNOSIS — Z4789 Encounter for other orthopedic aftercare: Secondary | ICD-10-CM

## 2015-02-19 ENCOUNTER — Telehealth: Payer: Self-pay | Admitting: Family Medicine

## 2015-02-19 NOTE — Telephone Encounter (Signed)
Pt is calling because he needs a prescription for Vitamin D. He has had 2 surgeries and really needs this. jw

## 2015-02-22 MED ORDER — CHOLECALCIFEROL 25 MCG (1000 UT) PO TABS: 1000.0000 [IU] | ORAL_TABLET | Freq: Two times a day (BID) | ORAL | Status: DC

## 2015-02-22 NOTE — Telephone Encounter (Signed)
Refilled cholecalciferol (vitamin D).   Thanks,  Joanna Puff, MD [facility] Resident  02/22/2015, 8:56 AM

## 2015-02-25 ENCOUNTER — Ambulatory Visit
Admission: RE | Admit: 2015-02-25 | Discharge: 2015-02-25 | Disposition: A | Discharge status: Home / Self Care | Payer: BLUE CROSS/BLUE SHIELD | Source: Ambulatory Visit | Attending: Orthopedic Surgery | Admitting: Orthopedic Surgery

## 2015-02-25 DIAGNOSIS — Z4789 Encounter for other orthopedic aftercare: Secondary | ICD-10-CM

## 2015-02-26 ENCOUNTER — Ambulatory Visit: Payer: BLUE CROSS/BLUE SHIELD | Admitting: Family Medicine

## 2015-03-02 ENCOUNTER — Ambulatory Visit (INDEPENDENT_AMBULATORY_CARE_PROVIDER_SITE_OTHER): Payer: BLUE CROSS/BLUE SHIELD | Admitting: Family Medicine

## 2015-03-02 ENCOUNTER — Encounter: Payer: Self-pay | Admitting: Family Medicine

## 2015-03-02 VITALS — BP 170/118 | HR 112 | Temp 98.1°F | Wt 253.0 lb

## 2015-03-02 DIAGNOSIS — I10 Essential (primary) hypertension: Secondary | ICD-10-CM

## 2015-03-02 MED ORDER — ASPIRIN EC 81 MG PO TBEC: 81.0000 mg | DELAYED_RELEASE_TABLET | Freq: Every day | ORAL | Status: DC

## 2015-03-02 MED ORDER — AMLODIPINE BESYLATE 5 MG PO TABS: 5.0000 mg | ORAL_TABLET | Freq: Every day | ORAL | Status: DC

## 2015-03-02 MED ORDER — LISINOPRIL 20 MG PO TABS: 20.0000 mg | ORAL_TABLET | Freq: Every day | ORAL | Status: DC
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
## 2015-03-02 NOTE — Patient Instructions (Addendum)
You need to check your blood sugar AT LEAST twice, once in the morning before eating or drinking anything.  Keep a log of your blood sugars and bring that in.  You can do coconut oil or non-fragrant lotion and vasaline.  I have added amlodipine 5mg  to your blood pressure regimen. Also, increase lisinopril to 20mg  (from 10mg ) daily. I have aspirin 81mg  (a baby aspirin to your list). Please follow up with me in 2 weeks.  Have your labwork from the orthopedist sent to our office.

## 2015-03-02 NOTE — Assessment & Plan Note (Signed)
Patient's BP has been consistently elevated and currently without pain therefore this is not the etiology. His elevated BPs have been chronic therefore I do not see the need to acutely lower them in the clinic with clonidine. He is not encephalopathy, denies abdominal pain, and continues to have good urine output therefore I'm less concerned with end organ damage.  Patient is going to get a BMET along with numerous other tests per his orthopedist today, will get these labs. - increasing lisinopril to 20mg  daily - adding amlodipine 5mg  daily. - patient to follow up with me for monitoring and repeat BMET in 2 weeks. -discussed return precautions (s/s of hypertensive emergency).

## 2015-03-02 NOTE — Assessment & Plan Note (Signed)
The patient did not come in with a CBG log. He only checks his sugars once a day and is not consistent with his Lantus. My concern currently is for hypoglycemia. The patient was advised to check his blood sugars at LEAST 2x/day, more preferably 3x/day; one CBG should be in fasting in the AM. The patient was advised to call the clinic if his CBGs were consistently below 70 so we can make changes to his regimen  - CBG log - Lantus 23units for now. - follow up in 2 weeks - did have a transaminitis therefore a statin has not been started; will get a CMET at next appt in 2 weeks to assess improvement since alcohol cessation.

## 2015-03-08 ENCOUNTER — Telehealth: Payer: Self-pay | Admitting: *Deleted

## 2015-03-08 NOTE — Telephone Encounter (Signed)
Gwynn for Dr. Ardeth [facility] office (ortho) calls stating they have recently drawn labs on patient and he was found to have very low Vit D level. They will be changing his meds to Vit D3 2000U twice daily, and Vit D2 once weekly. FYI to PCP.

## 2015-03-16 ENCOUNTER — Encounter: Payer: Self-pay | Admitting: Family Medicine

## 2015-03-16 ENCOUNTER — Ambulatory Visit (INDEPENDENT_AMBULATORY_CARE_PROVIDER_SITE_OTHER): Payer: BLUE CROSS/BLUE SHIELD | Admitting: Family Medicine

## 2015-03-16 VITALS — BP 156/108 | HR 113 | Temp 98.0°F | Ht 67.0 in | Wt 251.0 lb

## 2015-03-16 DIAGNOSIS — I10 Essential (primary) hypertension: Secondary | ICD-10-CM | POA: Diagnosis not present

## 2015-03-16 DIAGNOSIS — R740 Nonspecific elevation of levels of transaminase and lactic acid dehydrogenase [LDH]: Secondary | ICD-10-CM | POA: Diagnosis not present

## 2015-03-16 DIAGNOSIS — Z0000 Encounter for general adult medical examination without abnormal findings: Secondary | ICD-10-CM

## 2015-03-16 DIAGNOSIS — R7401 Elevation of levels of liver transaminase levels: Secondary | ICD-10-CM

## 2015-03-16 MED ORDER — AMLODIPINE BESYLATE 10 MG PO TABS: 10.0000 mg | ORAL_TABLET | Freq: Every day | ORAL | Status: DC

## 2015-03-16 NOTE — Assessment & Plan Note (Signed)
Patient's BPs have been consistent elevated, however have improved since his last visit. He states he occasionally takes his BPs at home, however he cannot tell me what those pressures are. Has a mild headache intermittently which I suspect may be secondary to his chronically elevated BP however denies one now. -  Will get a CMET today to assess for both renal function and AST/ALT (were elevated previously but I suspect that was secondary to alcohol use). - Continue lisinopril 20mg  daily  - Will increase amlodipine to 10mg  daily - Patient to f/u with me in 2 weeks. - Stressed the importance of keeping a log of BPs from home - Returned precautions discussed (signs and symptoms of hypertensive emergency).
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
## 2015-03-16 NOTE — Assessment & Plan Note (Signed)
Stressed the importance of checking blood sugars once again. Discussed the risks of hypoglycemia including coma and death with the patient.  - CBG log, at least twice per day but I would prefer 3x/day  - continue Lantus 23u qHS for now

## 2015-03-16 NOTE — Patient Instructions (Addendum)
Increase you amlodipine from 5 to 10mg  per day If you do not have improvement, we will need to increase your lisinopril at your next visit. Check you blood pressures daily and keep a log!!!! Check you blood sugars twice a day and keep a log.  Contact our office with the dentist office you are going to and what they need my letter to say for them to pull your tooth.   Follow up with me in 2-4 weeks; once we get your blood pressure under more control you will not have to come and see me as often.

## 2015-03-17 ENCOUNTER — Encounter: Payer: Self-pay | Admitting: Family Medicine

## 2015-03-17 LAB — COMPREHENSIVE METABOLIC PANEL
ALT: 16 U/L (ref 9–46)
AST: 13 U/L (ref 10–40)
Albumin: 4 g/dL (ref 3.6–5.1)
Alkaline Phosphatase: 62 U/L (ref 40–115)
BUN: 11 mg/dL (ref 7–25)
CO2: 23 mmol/L (ref 20–31)
Calcium: 9.5 mg/dL (ref 8.6–10.3)
Chloride: 101 mmol/L (ref 98–110)
Creat: 0.75 mg/dL (ref 0.60–1.35)
Glucose, Bld: 119 mg/dL — ABNORMAL HIGH (ref 65–99)
Potassium: 4.1 mmol/L (ref 3.5–5.3)
Sodium: 138 mmol/L (ref 135–146)
Total Bilirubin: 0.3 mg/dL (ref 0.2–1.2)
Total Protein: 7.4 g/dL (ref 6.1–8.1)

## 2015-03-17 LAB — HIV ANTIBODY (ROUTINE TESTING W REFLEX): HIV 1&2 Ab, 4th Generation: NONREACTIVE

## 2015-03-25 ENCOUNTER — Other Ambulatory Visit: Payer: Self-pay | Admitting: *Deleted

## 2015-03-25 DIAGNOSIS — I10 Essential (primary) hypertension: Secondary | ICD-10-CM

## 2015-03-25 MED ORDER — LISINOPRIL 20 MG PO TABS: 20.0000 mg | ORAL_TABLET | Freq: Every day | ORAL | Status: DC

## 2015-03-29 ENCOUNTER — Ambulatory Visit: Payer: BLUE CROSS/BLUE SHIELD | Admitting: Family Medicine

## 2015-04-06 ENCOUNTER — Telehealth: Payer: Self-pay | Admitting: Family Medicine

## 2015-04-06 NOTE — Telephone Encounter (Signed)
Needs a paper stating he cannot work at all.   Please call when it is ready for pickup

## 2015-04-06 NOTE — Telephone Encounter (Signed)
Please let the patient know that this note should come from the ortho surgeon as they better know his prognosis.   Thanks, [name] S. Dorsey, MD [facility] Resident  04/06/2015, 4:04 PM

## 2015-04-12 NOTE — Telephone Encounter (Signed)
Spoke with patient and he is aware of this. Jevante Hollibaugh,CMA  

## 2015-04-21 ENCOUNTER — Other Ambulatory Visit: Payer: Self-pay | Admitting: Family Medicine

## 2015-04-21 ENCOUNTER — Encounter: Payer: Self-pay | Admitting: Family Medicine

## 2015-04-21 ENCOUNTER — Ambulatory Visit (INDEPENDENT_AMBULATORY_CARE_PROVIDER_SITE_OTHER): Payer: BLUE CROSS/BLUE SHIELD | Admitting: Family Medicine

## 2015-04-21 VITALS — BP 145/99 | HR 96 | Temp 98.2°F | Ht 67.0 in | Wt 252.3 lb

## 2015-04-21 DIAGNOSIS — E119 Type 2 diabetes mellitus without complications: Secondary | ICD-10-CM

## 2015-04-21 DIAGNOSIS — I10 Essential (primary) hypertension: Secondary | ICD-10-CM

## 2015-04-21 LAB — BASIC METABOLIC PANEL
BUN: 10 mg/dL (ref 7–25)
CO2: 23 mmol/L (ref 20–31)
Calcium: 9.8 mg/dL (ref 8.6–10.3)
Chloride: 104 mmol/L (ref 98–110)
Creat: 1.07 mg/dL (ref 0.60–1.35)
Glucose, Bld: 113 mg/dL — ABNORMAL HIGH (ref 65–99)
Potassium: 4.2 mmol/L (ref 3.5–5.3)
Sodium: 140 mmol/L (ref 135–146)

## 2015-04-21 LAB — POCT GLYCOSYLATED HEMOGLOBIN (HGB A1C): Hemoglobin A1C: 7.7

## 2015-04-21 MED ORDER — ONETOUCH ULTRA SYSTEM W/DEVICE KIT: 1.0000 | PACK | Freq: Once | Status: DC

## 2015-04-21 MED ORDER — GLUCOSE BLOOD VI STRP
ORAL_STRIP | Status: DC
Start: 2015-04-21 — End: 2015-12-06

## 2015-04-21 MED ORDER — ONETOUCH ULTRASOFT LANCETS MISC: Status: DC

## 2015-04-21 MED ORDER — LISINOPRIL 20 MG PO TABS: 40.0000 mg | ORAL_TABLET | Freq: Every day | ORAL | Status: DC

## 2015-04-21 MED ORDER — BLOOD PRESSURE CUFF MISC: Status: DC

## 2015-04-21 MED ORDER — ASPIRIN EC 81 MG PO TBEC: 81.0000 mg | DELAYED_RELEASE_TABLET | Freq: Every day | ORAL | Status: DC

## 2015-04-21 MED ORDER — LISINOPRIL 40 MG PO TABS: 40.0000 mg | ORAL_TABLET | Freq: Every day | ORAL | Status: DC
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
## 2015-04-21 NOTE — Patient Instructions (Addendum)
I increased your lisinopril to 40mg  daily If you notice that your symptoms are getting worse instead of better come back and see us. You can try coricidin cough and cold high blood pressure

## 2015-04-22 ENCOUNTER — Encounter: Payer: Self-pay | Admitting: Family Medicine

## 2015-04-23 ENCOUNTER — Encounter: Payer: Self-pay | Admitting: Family Medicine

## 2015-04-23 NOTE — Assessment & Plan Note (Signed)
Patient endorses moderate compliance with taking Lantus, I am concerned that he has not been checking his CBGs. I have discussed this with the patient, his mother (who accompanied him to a previous appt) and to his wife today. Discussed symptoms of hypoglycemia. - Rx for new glucometer kit, discussed calling me if this is too expensive - continue Lantus for no  - continue metformin  - will re-send the list of ophthalmologist to the patient so he can check with his insurance company to find someone who is covered for his retinal scan.

## 2015-04-23 NOTE — Assessment & Plan Note (Signed)
The patient's BPs are still not at goal at max amlodipine. Given concerns for compliance and patient preference, would prefer to maximize currently doses prior to adding another agent.  - Increase lisinopril to 40mg  daily  - continue amlodipine 10mg  - Baseline BMET again today. - f/u in 2-4 weeks to monitor BP and f/u BMET

## 2015-05-05 ENCOUNTER — Ambulatory Visit: Payer: BLUE CROSS/BLUE SHIELD | Admitting: Family Medicine

## 2015-05-15 ENCOUNTER — Other Ambulatory Visit: Payer: Self-pay | Admitting: Family Medicine

## 2015-05-19 ENCOUNTER — Ambulatory Visit (INDEPENDENT_AMBULATORY_CARE_PROVIDER_SITE_OTHER): Payer: BLUE CROSS/BLUE SHIELD | Admitting: Family Medicine

## 2015-05-19 ENCOUNTER — Encounter: Payer: Self-pay | Admitting: Family Medicine

## 2015-05-19 VITALS — BP 139/72 | HR 105 | Temp 98.2°F | Wt 250.5 lb

## 2015-05-19 DIAGNOSIS — J069 Acute upper respiratory infection, unspecified: Secondary | ICD-10-CM | POA: Diagnosis not present

## 2015-05-19 MED ORDER — DM-GUAIFENESIN ER 30-600 MG PO TB12
1.0000 | ORAL_TABLET | Freq: Two times a day (BID) | ORAL | Status: DC
Start: 2015-05-19 — End: 2015-09-02

## 2015-05-19 MED ORDER — AMOXICILLIN-POT CLAVULANATE 875-125 MG PO TABS: 1.0000 | ORAL_TABLET | Freq: Two times a day (BID) | ORAL | Status: DC

## 2015-05-19 NOTE — Patient Instructions (Signed)
Take the cough suppressant as needed for cough twice daily. Take the antibiotic (Augmentin) twice daily until you run out (7 days total). Upper Respiratory Infection, Adult Most upper respiratory infections (URIs) are a viral infection of the air passages leading to the lungs. A URI affects the nose, throat, and upper air passages. The most common type of URI is nasopharyngitis and is typically referred to as "the common cold." URIs run their course and usually go away on their own. Most of the time, a URI does not require medical attention, but sometimes a bacterial infection in the upper airways can follow a viral infection. This is called a secondary infection. Sinus and middle ear infections are common types of secondary upper respiratory infections. Bacterial pneumonia can also complicate a URI. A URI can worsen asthma and chronic obstructive pulmonary disease (COPD). Sometimes, these complications can require emergency medical care and may be life threatening.  CAUSES Almost all URIs are caused by viruses. A virus is a type of germ and can spread from one person to another.  RISKS FACTORS You may be at risk for a URI if:   You smoke.   You have chronic heart or lung disease.  You have a weakened defense (immune) system.   You are very young or very old.   You have nasal allergies or asthma.  You work in crowded or poorly ventilated areas.  You work in health care facilities or schools. SIGNS AND SYMPTOMS  Symptoms typically develop 2-3 days after you come in contact with a cold virus. Most viral URIs last 7-10 days. However, viral URIs from the influenza virus (flu virus) can last 14-18 days and are typically more severe. Symptoms may include:   Runny or stuffy (congested) nose.   Sneezing.   Cough.   Sore throat.   Headache.   Fatigue.   Fever.   Loss of appetite.   Pain in your forehead, behind your eyes, and over your cheekbones (sinus pain).  Muscle  aches.  DIAGNOSIS  Your health care provider may diagnose a URI by:  Physical exam.  Tests to check that your symptoms are not due to another condition such as:  Strep throat.  Sinusitis.  Pneumonia.  Asthma. TREATMENT  A URI goes away on its own with time. It cannot be cured with medicines, but medicines may be prescribed or recommended to relieve symptoms. Medicines may help:  Reduce your fever.  Reduce your cough.  Relieve nasal congestion. HOME CARE INSTRUCTIONS   Take medicines only as directed by your health care provider.   Gargle warm saltwater or take cough drops to comfort your throat as directed by your health care provider.  Use a warm mist humidifier or inhale steam from a shower to increase air moisture. This may make it easier to breathe.  Drink enough fluid to keep your urine clear or pale yellow.   Eat soups and other clear broths and maintain good nutrition.   Rest as needed.   Return to work when your temperature has returned to normal or as your health care provider advises. You may need to stay home longer to avoid infecting others. You can also use a face mask and careful hand washing to prevent spread of the virus.  Increase the usage of your inhaler if you have asthma.   Do not use any tobacco products, including cigarettes, chewing tobacco, or electronic cigarettes. If you need help quitting, ask your health care provider. PREVENTION  The best way  to protect yourself from getting a cold is to practice good hygiene.   Avoid oral or hand contact with people with cold symptoms.   Wash your hands often if contact occurs.  There is no clear evidence that vitamin C, vitamin E, echinacea, or exercise reduces the chance of developing a cold. However, it is always recommended to get plenty of rest, exercise, and practice good nutrition.  SEEK MEDICAL CARE IF:   You are getting worse rather than better.   Your symptoms are not controlled by  medicine.   You have chills.  You have worsening shortness of breath.  You have brown or red mucus.  You have yellow or brown nasal discharge.  You have pain in your face, especially when you bend forward.  You have a fever.  You have swollen neck glands.  You have pain while swallowing.  You have white areas in the back of your throat. SEEK IMMEDIATE MEDICAL CARE IF:   You have severe or persistent:  Headache.  Ear pain.  Sinus pain.  Chest pain.  You have chronic lung disease and any of the following:  Wheezing.  Prolonged cough.  Coughing up blood.  A change in your usual mucus.  You have a stiff neck.  You have changes in your:  Vision.  Hearing.  Thinking.  Mood. MAKE SURE YOU:   Understand these instructions.  Will watch your condition.  Will get help right away if you are not doing well or get worse.   This information is not intended to replace advice given to you by your health care provider. Make sure you discuss any questions you have with your health care provider.   Document Released: 10/25/2000 Document Revised: 09/15/2014 Document Reviewed: 08/06/2013 Elsevier Interactive Patient Education [employer]
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
## 2015-05-20 ENCOUNTER — Ambulatory Visit: Payer: BLUE CROSS/BLUE SHIELD | Admitting: Family Medicine

## 2015-05-20 DIAGNOSIS — J069 Acute upper respiratory infection, unspecified: Secondary | ICD-10-CM | POA: Insufficient documentation

## 2015-05-20 NOTE — Assessment & Plan Note (Signed)
Patient presenting with cough, rhinorrhea, post-nasal drip, and sore throat for the last 2 weeks. Noted improvement then worsening concerning for superimposed bacterial infection. No SOB. Continues to eat/drink well. Lungs clear on exam. - Augmentin BID x 7 days  - Mucinex-DM q12hrs PRN cough- discussed possible increase in BPs with this so to use sparingly. -Discussed hydration - RTC precautions discussed.

## 2015-05-28 ENCOUNTER — Encounter: Payer: BLUE CROSS/BLUE SHIELD | Admitting: Family Medicine

## 2015-06-02 ENCOUNTER — Telehealth: Payer: Self-pay | Admitting: Family Medicine

## 2015-06-02 NOTE — Telephone Encounter (Signed)
Received labwork from ortho. Glucose high at 256 with an A1c of 9.9 (our was 7.7 previously last month). Testosterone is low at 66 , vitamin D normla at 42, TSh normal at 2.99, and PTH normal at 27.   Please have the patient schedule an appt with me and bring in a record of his blood sugars so we can see what's been going on with his glycemic control.  Thanks, Joanna Puff, MD [facility] Family Medicine Resident  06/02/2015, 12:33 PM

## 2015-06-04 ENCOUNTER — Encounter: Payer: BLUE CROSS/BLUE SHIELD | Admitting: Family Medicine

## 2015-06-07 NOTE — Telephone Encounter (Signed)
Patient has appointment scheduled for 1/27 with PCP.

## 2015-06-10 ENCOUNTER — Other Ambulatory Visit: Payer: Self-pay | Admitting: Family Medicine

## 2015-06-11 ENCOUNTER — Ambulatory Visit (INDEPENDENT_AMBULATORY_CARE_PROVIDER_SITE_OTHER): Payer: BLUE CROSS/BLUE SHIELD | Admitting: Family Medicine

## 2015-06-11 ENCOUNTER — Encounter: Payer: Self-pay | Admitting: Family Medicine

## 2015-06-11 VITALS — BP 136/90 | HR 112 | Temp 98.2°F | Ht 67.0 in | Wt 256.0 lb

## 2015-06-11 DIAGNOSIS — L309 Dermatitis, unspecified: Secondary | ICD-10-CM

## 2015-06-11 DIAGNOSIS — I10 Essential (primary) hypertension: Secondary | ICD-10-CM

## 2015-06-11 DIAGNOSIS — Z0000 Encounter for general adult medical examination without abnormal findings: Secondary | ICD-10-CM

## 2015-06-11 DIAGNOSIS — E119 Type 2 diabetes mellitus without complications: Secondary | ICD-10-CM | POA: Diagnosis not present

## 2015-06-11 DIAGNOSIS — Z794 Long term (current) use of insulin: Secondary | ICD-10-CM

## 2015-06-11 LAB — HM DIABETES EYE EXAM

## 2015-06-11 NOTE — Patient Instructions (Signed)
I want you to start walking 30-45 minutes at least 4 days a week Before you do other strenuous exercises I'd ask your other doctor. I want you to continue to check your blood sugar in the morning and then again after eating lunch  Your blood pressure looks great! I am checking your cholesterol, we may need to add a statin to lower your bad cholesterol if it is too high- I will contact you about this. I have referred you to a nutritionist, call her to make an appointment.

## 2015-06-11 NOTE — Assessment & Plan Note (Signed)
Patient with A1c in Dec of 7.7, noted to have A1c at ortho of 9.9 1 month later. Patient's reported CBGs do not correlate with his A1c. Discuss importance of compliance. Discussed checking blood sugars at least twice daily, preferably three times daily.  - Lantus 23 units qHS - continue metformin  - bring CBGs in next appt (3 months), will check A1c at that time  - referral to Dr. Gerilyn Pilgrim, nutritionist - had eye exam today, office to fax [place] the records. - declines pneumococcal vaccine.
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
## 2015-06-11 NOTE — Assessment & Plan Note (Signed)
BP at goal today.

## 2015-06-11 NOTE — Assessment & Plan Note (Signed)
Very dry, scaling skin. Discussed importance of using non-fragrant ointment such as Aquaphor or something like coco butter.

## 2015-06-11 NOTE — Assessment & Plan Note (Signed)
Patient declines vaccines today, states he will make nursing appt for influenza and pneumococcal vaccine.  No family h/o colon cancer. - discussed dietary changes  - patient to start walking (if no pain) 30 minutes per day x 5 days a week - referral to Dr. Ardeen Garland. - too early for Lipid panel, will obtain at next visit.

## 2015-06-16 ENCOUNTER — Other Ambulatory Visit: Payer: BLUE CROSS/BLUE SHIELD

## 2015-06-25 ENCOUNTER — Other Ambulatory Visit: Payer: BLUE CROSS/BLUE SHIELD

## 2015-06-25 DIAGNOSIS — Z794 Long term (current) use of insulin: Secondary | ICD-10-CM

## 2015-06-25 DIAGNOSIS — Z0000 Encounter for general adult medical examination without abnormal findings: Secondary | ICD-10-CM

## 2015-06-25 DIAGNOSIS — E119 Type 2 diabetes mellitus without complications: Secondary | ICD-10-CM

## 2015-06-25 LAB — LIPID PANEL
Cholesterol: 243 mg/dL — ABNORMAL HIGH (ref 125–200)
HDL: 32 mg/dL — ABNORMAL LOW (ref >=40)
LDL Cholesterol: 174 mg/dL — ABNORMAL HIGH (ref <130)
Total CHOL/HDL Ratio: 7.6 Ratio — ABNORMAL HIGH (ref <=5.0)
Triglycerides: 183 mg/dL — ABNORMAL HIGH (ref <150)
VLDL: 37 mg/dL — ABNORMAL HIGH (ref <30)

## 2015-06-25 NOTE — Progress Notes (Signed)
Lipid done today Marcus [name] 

## 2015-06-29 ENCOUNTER — Telehealth: Payer: Self-pay | Admitting: Family Medicine

## 2015-06-29 ENCOUNTER — Ambulatory Visit: Payer: BLUE CROSS/BLUE SHIELD | Admitting: Family Medicine

## 2015-06-29 ENCOUNTER — Encounter: Payer: Self-pay | Admitting: Family Medicine

## 2015-06-29 MED ORDER — ATORVASTATIN CALCIUM 40 MG PO TABS: 40.0000 mg | ORAL_TABLET | Freq: Every day | ORAL | Status: DC

## 2015-06-29 NOTE — Telephone Encounter (Signed)
Called patient to let him we need to start Lipitor for his high colesterol. . No answer. Will wait a call back.   Joanna Puff, MD [facility] Family Medicine Resident  06/29/2015, 1:30 PM

## 2015-07-05 ENCOUNTER — Other Ambulatory Visit: Payer: Self-pay | Admitting: Family Medicine

## 2015-07-30 ENCOUNTER — Other Ambulatory Visit: Payer: Self-pay | Admitting: Family Medicine

## 2015-08-02 ENCOUNTER — Other Ambulatory Visit: Payer: Self-pay | Admitting: Family Medicine

## 2015-08-05 ENCOUNTER — Telehealth: Payer: Self-pay | Admitting: Family Medicine

## 2015-08-05 NOTE — Telephone Encounter (Signed)
Patient called inquiring about the referral to a podiatrist.  Please see note written regarding msg about scheduling appt.  Message is confusing.

## 2015-08-06 NOTE — Telephone Encounter (Signed)
Yes, [facility] called patient 2x back in Feb. Pt never called back to schedule appt. Called patient, he did not answer, left on his VM that he will need to call [facility] at [phone] to schedule his appt.

## 2015-08-06 NOTE — Telephone Encounter (Signed)
Looking over the message, it looks like podiatry left a message to schedule an appt, however will forward to Tia.  Best, [name] S. Dorsey, MD [facility] Resident  08/06/2015, 12:47 PM

## 2015-08-11 ENCOUNTER — Ambulatory Visit: Payer: BLUE CROSS/BLUE SHIELD | Admitting: Podiatry

## 2015-08-12 ENCOUNTER — Ambulatory Visit: Payer: BLUE CROSS/BLUE SHIELD | Admitting: Family Medicine

## 2015-08-13 ENCOUNTER — Ambulatory Visit (INDEPENDENT_AMBULATORY_CARE_PROVIDER_SITE_OTHER): Payer: BLUE CROSS/BLUE SHIELD | Admitting: Family Medicine

## 2015-08-13 ENCOUNTER — Encounter: Payer: Self-pay | Admitting: Family Medicine

## 2015-08-13 VITALS — BP 140/90 | HR 112 | Temp 98.4°F | Wt 251.0 lb

## 2015-08-13 DIAGNOSIS — R7309 Other abnormal glucose: Secondary | ICD-10-CM | POA: Diagnosis not present

## 2015-08-13 DIAGNOSIS — S8992XA Unspecified injury of left lower leg, initial encounter: Secondary | ICD-10-CM | POA: Diagnosis not present

## 2015-08-13 LAB — POCT GLYCOSYLATED HEMOGLOBIN (HGB A1C): Hemoglobin A1C: 11.6

## 2015-08-13 LAB — GLUCOSE, CAPILLARY: Glucose-Capillary: 240 mg/dL — ABNORMAL HIGH (ref 65–99)

## 2015-08-13 MED ORDER — NAPROXEN SODIUM 220 MG PO CAPS: 1.0000 | ORAL_CAPSULE | Freq: Two times a day (BID) | ORAL | Status: DC | PRN

## 2015-08-13 NOTE — Progress Notes (Signed)
    Subjective   Marcus [name] is a 40 y.o. male that presents for a same day visit  1. Left leg pain: Two days ago, patient reports tripping over a curb and fell on the cement, hitting his left leg. Patient reports he has been taking Tylenol which has helped. He reports pain with walking. No fevers, nausea, redness around the area. He reports some swelling in his leg. He has not been icing but has somewhat been elevating his leg and keeping off of it.  ROS Per HPI  Social History  Substance Use Topics  . Smoking status: Never Smoker   . Smokeless tobacco: Never Used  . Alcohol Use: Yes     Comment: Socially     Allergies  Allergen Reactions  . Vicodin [Hydrocodone-Acetaminophen] Hives and Itching    Objective   BP 140/90 mmHg  Pulse 112  Temp(Src) 98.4 F (36.9 C) (Oral)  Wt 251 lb (113.853 kg)  General: Well appearing, no distress Musculoskeletal: Antalgic gait, point tenderness along tibia. Tenderness of posterior medial and lateral malleoli. Slight swelling around ankle which is equal to right leg. 2+ DP pulses bilaterally. Skin: Multiple healed wounds on bilateral legs.  Assessment and Plan   1. Left leg injury, initial encounter Patient with possible fracture. Advised to be non-weight bearing until negative x-rays. More likely bone bruise. Will treat pain with short course of NSAIDs. RICE therapy discussed. Follow-up if symptoms worsen. - DG Tibia/Fibula Left; Future - DG Ankle Complete Left; Future - Naproxen Sodium 220 MG CAPS; Take 1 capsule (220 mg total) by mouth 2 (two) times daily as needed.  Dispense: 20 each; Refill: 0

## 2015-08-13 NOTE — Patient Instructions (Addendum)
Thank you for coming to see me today. It was a pleasure. Today we talked about:   Left leg pain: I am getting an x-ray of your left leg. Please keep off of it as it may be a fracture. Likely, however, it is a bone bruise. Please continue Tylenol as needed. Follow-up if symptoms worsen.  If you have any questions or concerns, please do not hesitate to call the office at [phone].  Sincerely,  Jacquelin Hawkingalph Waleed Dettman, MD

## 2015-08-16 ENCOUNTER — Other Ambulatory Visit: Payer: Self-pay | Admitting: *Deleted

## 2015-08-16 DIAGNOSIS — Z794 Long term (current) use of insulin: Secondary | ICD-10-CM

## 2015-08-16 DIAGNOSIS — E119 Type 2 diabetes mellitus without complications: Secondary | ICD-10-CM

## 2015-08-16 MED ORDER — METFORMIN HCL 1000 MG PO TABS: 1000.0000 mg | ORAL_TABLET | Freq: Two times a day (BID) | ORAL | Status: DC

## 2015-08-16 MED ORDER — PEN NEEDLES 32G X 4 MM MISC: 1.0000 "pen " | Freq: Every day | Status: DC

## 2015-08-16 MED ORDER — INSULIN GLARGINE 100 UNIT/ML SOLOSTAR PEN: 23.0000 [IU] | PEN_INJECTOR | Freq: Every day | SUBCUTANEOUS | Status: DC

## 2015-08-16 NOTE — Telephone Encounter (Signed)
Patient was seen on same day for knee pain and mentioned that his glucose had been elevated recently.  Patient also stated that he had been out of diabetes medication and pen needles for about 2 weeks.  Informed patient that he didn't need an appt to see PCP to get these refilled if he was out but that he would need an appt to follow up on his diabetes.  Pt voiced understanding and was planning to make an appt with PCP.  Will forward to MD to refill these medications. Alannie Amodio,CMA

## 2015-08-18 ENCOUNTER — Encounter: Payer: Self-pay | Admitting: Family Medicine

## 2015-08-18 ENCOUNTER — Ambulatory Visit: Payer: BLUE CROSS/BLUE SHIELD | Admitting: Obstetrics and Gynecology

## 2015-08-18 ENCOUNTER — Telehealth: Payer: Self-pay | Admitting: Family Medicine
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
## 2015-08-19 ENCOUNTER — Ambulatory Visit (INDEPENDENT_AMBULATORY_CARE_PROVIDER_SITE_OTHER): Payer: BLUE CROSS/BLUE SHIELD | Admitting: Family Medicine

## 2015-08-19 ENCOUNTER — Ambulatory Visit: Payer: BLUE CROSS/BLUE SHIELD | Admitting: Family Medicine

## 2015-08-19 ENCOUNTER — Encounter: Payer: Self-pay | Admitting: Family Medicine

## 2015-08-19 ENCOUNTER — Ambulatory Visit
Admission: RE | Admit: 2015-08-19 | Discharge: 2015-08-19 | Disposition: A | Discharge status: Home / Self Care | Payer: BLUE CROSS/BLUE SHIELD | Source: Ambulatory Visit | Attending: Family Medicine | Admitting: Family Medicine

## 2015-08-19 VITALS — BP 143/84 | HR 116 | Temp 98.1°F | Ht 67.0 in | Wt 253.5 lb

## 2015-08-19 DIAGNOSIS — S8992XA Unspecified injury of left lower leg, initial encounter: Secondary | ICD-10-CM

## 2015-08-19 DIAGNOSIS — S8992XD Unspecified injury of left lower leg, subsequent encounter: Secondary | ICD-10-CM | POA: Diagnosis not present

## 2015-08-19 NOTE — Patient Instructions (Signed)
Thank you for coming in,   Please continue to ice and use ibuprofen for pain as needed.   Please bring all of your medications with you to each visit.   Sign up for My Chart to have easy access to your labs results, and communication with your Primary care physician   Please feel free to call with any questions or concerns at any time, at [phone]. --Dr. Jordan LikesSchmitz
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
## 2015-08-20 ENCOUNTER — Encounter: Payer: Self-pay | Admitting: Family Medicine

## 2015-08-21 DIAGNOSIS — S8992XA Unspecified injury of left lower leg, initial encounter: Secondary | ICD-10-CM | POA: Insufficient documentation

## 2015-08-21 NOTE — Assessment & Plan Note (Signed)
Still having pain but x-rays are normal.  Limited US today by myself didn't show any hematoma or partial muscle tear  Most likely having a soft tissue contusion  - advised ICE and ibuprofen or tylenol for pain  - given note stating this was follow up regarding fall.  - follow up PRN.

## 2015-08-23 ENCOUNTER — Telehealth: Payer: Self-pay | Admitting: Family Medicine

## 2015-08-23 NOTE — Telephone Encounter (Signed)
The patient emailed me asking for an appt ASAP to discuss his memory. Please call him at [phone] and set up an appt with myself or another provider. At that time, please have him bring in a log of his blood sugars as well.  Thanks, [name] S. Dorsey, MD [facility] Resident  08/23/2015, 3:45 PM

## 2015-08-24 ENCOUNTER — Other Ambulatory Visit: Payer: Self-pay | Admitting: Family Medicine

## 2015-08-26 ENCOUNTER — Encounter: Payer: Self-pay | Admitting: Podiatry

## 2015-08-26 ENCOUNTER — Ambulatory Visit (INDEPENDENT_AMBULATORY_CARE_PROVIDER_SITE_OTHER): Payer: BLUE CROSS/BLUE SHIELD | Admitting: Podiatry

## 2015-08-26 VITALS — BP 116/78 | HR 113 | Resp 14

## 2015-08-26 DIAGNOSIS — M79676 Pain in unspecified toe(s): Secondary | ICD-10-CM | POA: Diagnosis not present

## 2015-08-26 DIAGNOSIS — B351 Tinea unguium: Secondary | ICD-10-CM | POA: Diagnosis not present
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
## 2015-08-26 NOTE — Telephone Encounter (Signed)
Appt has been scheduled for 4/24

## 2015-09-02 ENCOUNTER — Ambulatory Visit (INDEPENDENT_AMBULATORY_CARE_PROVIDER_SITE_OTHER): Payer: BLUE CROSS/BLUE SHIELD | Admitting: Family Medicine

## 2015-09-02 VITALS — BP 118/78 | HR 113 | Temp 98.9°F | Wt 255.0 lb

## 2015-09-02 DIAGNOSIS — S8992XD Unspecified injury of left lower leg, subsequent encounter: Secondary | ICD-10-CM

## 2015-09-02 DIAGNOSIS — K089 Disorder of teeth and supporting structures, unspecified: Secondary | ICD-10-CM | POA: Diagnosis not present

## 2015-09-02 DIAGNOSIS — L989 Disorder of the skin and subcutaneous tissue, unspecified: Secondary | ICD-10-CM | POA: Diagnosis not present

## 2015-09-02 MED ORDER — DOXYCYCLINE HYCLATE 100 MG PO TABS: 100.0000 mg | ORAL_TABLET | Freq: Two times a day (BID) | ORAL | Status: DC

## 2015-09-02 NOTE — Patient Instructions (Addendum)
Thank you for coming in,   I have sent in the doxycyline. If the spots are not getting any better or are more painful then start taking the antibiotic.   Please continue exercising.   You can try capsaicin cream for the pain in your leg.   Please bring all of your medications with you to each visit.   Sign up for My Chart to have easy access to your labs results, and communication with your Primary care physician   Please feel free to call with any questions or concerns at any time, at [phone]. --Dr. Jordan LikesSchmitz

## 2015-09-02 NOTE — Progress Notes (Signed)
   Subjective:    Patient ID: Katherine BassetErick T Lich, male    DOB: 1975/08/06, 40 y.o.   MRN: [id number]  Seen for Same day visit for   CC: Left leg pain.   [facility]  He tripped his left foot and fell on the concerted onto his left tibia.  This occurred on march 29.  Pain has improved.  Locates the pain on the lateral aspect of his fibia of his left leg .  Using Ice has helped some.  No prior injury to that area.  No tingling or numbness.   Bumps on forehead.  He has two raised areas on the brow of his forehead.  One located on the lateral aspect of his right eyebrow and the other on the medial aspect of the left eyebrow.  These started two days ago.  They had some draining initially but has since stopped.  He has been scratching them trying to express more fluid.  Denies having any scratches.  There are no changes in his vision Currently it is a 1/10.   SH: denies any alcohol or tobacco  PMH: morbid obesity   Review of Systems   See HPI for ROS. Objective:  BP 118/78 mmHg  Pulse 113  Temp(Src) 98.9 F (37.2 C) (Oral)  Wt 255 lb (115.667 kg)  General: NAD HEENT: EOMI, area of induration on his right lateral brow and left medial brow. No fluctuance appreciated, no warmth. Some redness over each area. They are painful to palpate. Terrible dentition CV: tachycardic, regular rhythm, no edema  MSK: normal gait   Left leg:  No obvious defect  Some TTP on the left lateral leg over the fibularis longus  No TTP over the anterior tibia  Full range of motion of knee and ankle.  Normal strength  Neurovascularly intact      Assessment & Plan:   Left leg injury Still having some pain.  Should continue to get better  - encouraged icing and regular exercises    Bumps on skin Two areas on his brow that could be infectious in nature.  Has pain with palpation but no fluctuance or able to express any purulent material.  - given rx for  doxycycline. Advised that if they do not get better in one day then he should start taking the antibiotic  - if he feels they are getting worse then he should be seen.   Poor dentition He has terrible dentition and this could be the course for future infection.  Doesn't appear to have a dental infection currently - strongly encouraged that he needs to be seen by a dentist in the near future.

## 2015-09-06 ENCOUNTER — Encounter: Payer: Self-pay | Admitting: Family Medicine

## 2015-09-06 ENCOUNTER — Ambulatory Visit (INDEPENDENT_AMBULATORY_CARE_PROVIDER_SITE_OTHER): Payer: BLUE CROSS/BLUE SHIELD | Admitting: Family Medicine

## 2015-09-06 VITALS — BP 138/88 | HR 112 | Temp 97.7°F | Ht 67.0 in | Wt 251.0 lb

## 2015-09-06 DIAGNOSIS — R21 Rash and other nonspecific skin eruption: Secondary | ICD-10-CM

## 2015-09-06 DIAGNOSIS — K089 Disorder of teeth and supporting structures, unspecified: Secondary | ICD-10-CM | POA: Insufficient documentation

## 2015-09-06 DIAGNOSIS — S8992XD Unspecified injury of left lower leg, subsequent encounter: Secondary | ICD-10-CM

## 2015-09-06 DIAGNOSIS — R413 Other amnesia: Secondary | ICD-10-CM

## 2015-09-06 DIAGNOSIS — L989 Disorder of the skin and subcutaneous tissue, unspecified: Secondary | ICD-10-CM | POA: Insufficient documentation

## 2015-09-06 LAB — TSH: TSH: 0.67 mIU/L (ref 0.40–4.50)

## 2015-09-06 NOTE — Assessment & Plan Note (Signed)
Still having some pain.  Should continue to get better  - encouraged icing and regular exercises

## 2015-09-06 NOTE — Assessment & Plan Note (Signed)
Two areas on his brow that could be infectious in nature.  Has pain with palpation but no fluctuance or able to express any purulent material.  - given rx for doxycycline. Advised that if they do not get better in one day then he should start taking the antibiotic  - if he feels they are getting worse then he should be seen.

## 2015-09-06 NOTE — Patient Instructions (Signed)
I will call you with the results of the punch biopsy. Try to get out and do something every day.

## 2015-09-06 NOTE — Assessment & Plan Note (Signed)
He has terrible dentition and this could be the course for future infection.  Doesn't appear to have a dental infection currently - strongly encouraged that he needs to be seen by a dentist in the near future.
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
## 2015-09-07 ENCOUNTER — Telehealth: Payer: Self-pay | Admitting: *Deleted

## 2015-09-07 LAB — RPR

## 2015-09-07 NOTE — Telephone Encounter (Signed)
LVM for pt to call back to remind him of his upcoming appointment with Dr. Leonides Schanzorsey on 09/24/2015. Marcus [name], Domonique Brouillard D, [place]

## 2015-09-09 ENCOUNTER — Encounter: Payer: Self-pay | Admitting: Family Medicine

## 2015-09-09 DIAGNOSIS — R21 Rash and other nonspecific skin eruption: Secondary | ICD-10-CM | POA: Insufficient documentation

## 2015-09-09 DIAGNOSIS — R413 Other amnesia: Secondary | ICD-10-CM | POA: Insufficient documentation

## 2015-09-09 NOTE — Assessment & Plan Note (Signed)
Improving.

## 2015-09-09 NOTE — Assessment & Plan Note (Signed)
Patient presenting with significant rash over his left posterior thigh and now left wrist. There are freely mobile firm nodules underlying the masses, however they do not feel like cysts/abscesses. There is no warmth to suggest infection. Patient without systemic symptoms. On injecting the rash laterally to medially, some lidocaine exited the central aspect of the rash where the scab was, suggesting the possibility of some free space underlying the area. No drainage noted. Discussed with attending, Dr. Randolm IdolFletke. Performed a punch biopsy of the left wrist lesion.  At next visit, consider another trial of triamcinolone cream vs another stronger topical steroid.
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
## 2015-09-14 ENCOUNTER — Other Ambulatory Visit: Payer: Self-pay | Admitting: Family Medicine

## 2015-09-20 ENCOUNTER — Telehealth: Payer: Self-pay | Admitting: Family Medicine

## 2015-09-20 NOTE — Telephone Encounter (Signed)
Attempted to call patient to discuss pathology results. No answer at either number. Will attempt again later.  [name] S. Dorsey, MD [facility] Res[facility]  09/20/2015, 12:19 PM

## 2015-09-24 ENCOUNTER — Encounter: Payer: Self-pay | Admitting: Family Medicine

## 2015-09-24 ENCOUNTER — Ambulatory Visit (INDEPENDENT_AMBULATORY_CARE_PROVIDER_SITE_OTHER): Payer: BLUE CROSS/BLUE SHIELD | Admitting: Family Medicine

## 2015-09-24 VITALS — BP 124/83 | HR 106 | Temp 98.2°F | Wt 261.7 lb

## 2015-09-24 DIAGNOSIS — S8992XD Unspecified injury of left lower leg, subsequent encounter: Secondary | ICD-10-CM

## 2015-09-24 DIAGNOSIS — R413 Other amnesia: Secondary | ICD-10-CM | POA: Diagnosis not present

## 2015-09-24 DIAGNOSIS — R21 Rash and other nonspecific skin eruption: Secondary | ICD-10-CM

## 2015-09-24 MED ORDER — GABAPENTIN 300 MG PO CAPS: ORAL_CAPSULE | ORAL | Status: DC

## 2015-09-24 NOTE — Patient Instructions (Signed)
Start taking gabapentin 300mg  in the morning, 300mg  in the evening, and 600mg  at bedtime to see if that helps with your leg pain. If you feel excessively tired in the morning compared to usual, decrease by to 300mg  three times a day. At your next visit, bring in all you medications.
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
## 2015-09-26 NOTE — Assessment & Plan Note (Signed)
Left leg pain stable from previous. Given shooting pain his orthopedist referred him to neurology for nerve studies. - will increase bedtime dose of gabapentin to 600mg , continue 300mg  for the other 2 doses. Discussed decreasing back to 300mg  if he notes significant morning time drowsiness.

## 2015-09-26 NOTE — Assessment & Plan Note (Signed)
No abnormalities on previous labs. Improving now that he has been started on medication for PTSD and hallucinations. I suspect he was placed on an antipsychotic which may worsen his weight gain, lipid profile, and diabetes.  - asked pt to have records sent to our office

## 2015-09-26 NOTE — Assessment & Plan Note (Signed)
Punch biopsy results suggestive of arthropod bites. Discussed with patient and rash has completely resolved.  This was very unusual for bug bites, I'm curious if he was constantly scratching at the areas causing the appearance to change significantly. Area of punch biopsy site healing well without evidence of infection. Discussed letting it air out but gave him a band aid in case the area began to bleed if he picked at it.

## 2015-10-07 ENCOUNTER — Telehealth: Payer: Self-pay | Admitting: *Deleted

## 2015-10-07 NOTE — Telephone Encounter (Signed)
Returned patient call at [phone] States he sees Peggyann ShoalsJessica Beckman, a therapist who is unable to prescribe meds. Wondering if Dr. Leonides Schanzorsey would be willing to prescribe med, however, patient is not at home and does not remember name of med. Patient will call back tomorrow with name of med. Fredderick SeveranceUCATTE, Ripley Bogosian L, RN

## 2015-10-12 ENCOUNTER — Encounter: Payer: Self-pay | Admitting: Family Medicine

## 2015-10-12 ENCOUNTER — Other Ambulatory Visit: Payer: Self-pay | Admitting: Family Medicine

## 2015-10-12 ENCOUNTER — Ambulatory Visit: Payer: BLUE CROSS/BLUE SHIELD | Admitting: Family Medicine

## 2015-10-12 MED ORDER — ATORVASTATIN CALCIUM 40 MG PO TABS: 40.0000 mg | ORAL_TABLET | Freq: Every day | ORAL | Status: DC

## 2015-10-12 MED ORDER — AMLODIPINE BESYLATE 10 MG PO TABS: 10.0000 mg | ORAL_TABLET | Freq: Every day | ORAL | Status: DC

## 2015-10-12 MED ORDER — ASPIRIN EC 81 MG PO TBEC: 81.0000 mg | DELAYED_RELEASE_TABLET | Freq: Every day | ORAL | Status: DC

## 2015-10-12 MED ORDER — LISINOPRIL 40 MG PO TABS: 40.0000 mg | ORAL_TABLET | Freq: Every day | ORAL | Status: DC

## 2015-10-12 MED ORDER — LORATADINE 10 MG PO TABS: 10.0000 mg | ORAL_TABLET | Freq: Every day | ORAL | Status: DC

## 2015-10-14 ENCOUNTER — Ambulatory Visit (INDEPENDENT_AMBULATORY_CARE_PROVIDER_SITE_OTHER): Payer: BLUE CROSS/BLUE SHIELD | Admitting: Family Medicine

## 2015-10-14 ENCOUNTER — Encounter: Payer: Self-pay | Admitting: Family Medicine

## 2015-10-14 VITALS — BP 115/71 | HR 109 | Temp 98.2°F | Ht 67.0 in | Wt 260.2 lb

## 2015-10-14 DIAGNOSIS — E349 Endocrine disorder, unspecified: Secondary | ICD-10-CM

## 2015-10-14 DIAGNOSIS — E291 Testicular hypofunction: Secondary | ICD-10-CM

## 2015-10-14 DIAGNOSIS — F5221 Male erectile disorder: Secondary | ICD-10-CM | POA: Diagnosis not present

## 2015-10-14 DIAGNOSIS — N529 Male erectile dysfunction, unspecified: Secondary | ICD-10-CM

## 2015-10-14 DIAGNOSIS — E559 Vitamin D deficiency, unspecified: Secondary | ICD-10-CM | POA: Diagnosis not present

## 2015-10-14 NOTE — Patient Instructions (Signed)
I am checking your vitamin D level and testosterone. At your next appointment, discuss your sexual issues with the psychiatrist, as your medication may be complicating the issue. Follow up with me in 3 months, at that time we will check you diabetes.  START checking your blood sugars twice daily- in the morning and after lunch or dinner.  Keep track of what your blood sugars are looking like.
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
## 2015-10-16 DIAGNOSIS — N529 Male erectile dysfunction, unspecified: Secondary | ICD-10-CM | POA: Insufficient documentation

## 2015-10-16 NOTE — Assessment & Plan Note (Signed)
Attempted to check vitamin D level today in clinic, unfortunately it is too late in the day. We'll check next appointment.

## 2015-10-16 NOTE — Assessment & Plan Note (Addendum)
Patient has numerous reasons to have difficulty maintaining an erection including hyperglycemia, concerns for some neuropathy, and new medications such as his SSRI. She does not have any contraindications for Viagra at this time. I discussed a GU exam with the patient. He would like to hold off on the examination and the medication until after he speaks with his psychiatrist about the potential side effects of his medications, and if there are other alternatives. At his next visit, will check a testosterone level per the patient's request. He has had a low testosterone in the past.

## 2015-10-25 ENCOUNTER — Ambulatory Visit: Payer: BLUE CROSS/BLUE SHIELD | Admitting: Family Medicine

## 2015-11-01 ENCOUNTER — Ambulatory Visit (INDEPENDENT_AMBULATORY_CARE_PROVIDER_SITE_OTHER): Payer: BLUE CROSS/BLUE SHIELD | Admitting: Neurology

## 2015-11-01 ENCOUNTER — Ambulatory Visit (INDEPENDENT_AMBULATORY_CARE_PROVIDER_SITE_OTHER): Payer: Self-pay | Admitting: Neurology

## 2015-11-01 ENCOUNTER — Encounter: Payer: Self-pay | Admitting: Neurology

## 2015-11-01 DIAGNOSIS — G5702 Lesion of sciatic nerve, left lower limb: Secondary | ICD-10-CM

## 2015-11-01 DIAGNOSIS — G5732 Lesion of lateral popliteal nerve, left lower limb: Secondary | ICD-10-CM

## 2015-11-01 HISTORY — DX: Lesion of sciatic nerve, left lower limb: G57.02
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
## 2015-11-01 NOTE — Progress Notes (Signed)
Please refer to EMG and nerve conduction study procedure note. 

## 2015-11-11 ENCOUNTER — Telehealth: Payer: Self-pay | Admitting: Neurology

## 2015-11-11 ENCOUNTER — Encounter: Payer: Self-pay | Admitting: Family Medicine

## 2015-11-11 ENCOUNTER — Ambulatory Visit (INDEPENDENT_AMBULATORY_CARE_PROVIDER_SITE_OTHER): Payer: BLUE CROSS/BLUE SHIELD | Admitting: Family Medicine

## 2015-11-11 VITALS — BP 137/98 | HR 104 | Temp 98.1°F | Wt 255.0 lb

## 2015-11-11 DIAGNOSIS — J029 Acute pharyngitis, unspecified: Secondary | ICD-10-CM

## 2015-11-11 DIAGNOSIS — J309 Allergic rhinitis, unspecified: Secondary | ICD-10-CM | POA: Diagnosis not present

## 2015-11-11 LAB — POCT RAPID STREP A (OFFICE): Rapid Strep A Screen: NEGATIVE

## 2015-11-11 MED ORDER — FLUTICASONE PROPIONATE 50 MCG/ACT NA SUSP: 1.0000 | Freq: Every day | NASAL | Status: DC

## 2015-11-11 NOTE — Progress Notes (Signed)
Subjective:    Patient ID: Marcus [name], male    DOB: 03-14-1976, 40 y.o.   MRN: [id number]  Marcus [name] is a 40 y.o. male presenting on 11/11/2015 for Sore Throat   Patient presents for a same day appointment.   HPI  SORE THROAT / CONGESTION: - Reports symptoms started about 3-4 days with sore throat and some congestion worse in morning and at night. Also with a mild productive cough. Tried theraflu with some mild relief. Sick contact with wife x 1 day with congestion and not feeling right. - History of allergies. Previously advised to get nasal steroid but has not started this. - Admits subjective fever, nausea with x 1 vomiting (resolved) - Denies abdominal pain, diarrhea, headache   Social History  Substance Use Topics  . Smoking status: Never Smoker   . Smokeless tobacco: Never Used  . Alcohol Use: Yes     Comment: Socially     Review of Systems Per HPI unless specifically indicated above     Objective:    BP 137/98 mmHg  Pulse 104  Temp(Src) 98.1 F (36.7 C) (Oral)  Wt 255 lb (115.667 kg)  Wt Readings from Last 3 Encounters:  11/11/15 255 lb (115.667 kg)  10/14/15 260 lb 3.2 oz (118.026 kg)  09/24/15 261 lb 11.2 oz (118.706 kg)    Physical Exam  Constitutional: He appears well-developed and well-nourished. No distress.  Well-appearing, comfortable, cooperative  HENT:  Head: Normocephalic and atraumatic.  Mouth/Throat: Oropharynx is clear and moist.  TMs clear without erythema, effusion or bulging. Sinuses non-tender. Bilateral nasal turbinates with mild edema and some mild congestion. Oropharynx symmetrical no erythema, no exudates, no edema.  Eyes: Conjunctivae are normal. Right eye exhibits no discharge. Left eye exhibits no discharge.  Neck: Normal range of motion. Neck supple. No thyromegaly present.  Cardiovascular: Normal rate, regular rhythm, normal heart sounds and intact distal pulses.   No murmur heard. Pulmonary/Chest: Effort normal and  breath sounds normal. No respiratory distress. He has no wheezes. He has no rales.  Lymphadenopathy:    He has no cervical adenopathy.  Neurological: He is alert.  Skin: Skin is warm and dry. He is not diaphoretic.  Nursing note and vitals reviewed.  Results for orders placed or performed in visit on 11/11/15  POCT rapid strep A  Result Value Ref Range   Rapid Strep A Screen Negative Negative      Assessment & Plan:   Problem List Items Addressed This Visit    Rhinitis, allergic   Relevant Medications   fluticasone (FLONASE) 50 MCG/ACT nasal spray    Other Visit Diagnoses    Sore throat    -  Primary    Relevant Orders    POCT rapid strep A (Completed)    Viral pharyngitis           Consistent with acute pharyngitis, suspected viral etiology vs allergic rhinitis and postnasal drip with night-time pharyngeal irritation, +sick contacts. Nurse obtained rapid strep (negative), low suspicion for strep. Afebrile and no focal signs of infection (TM's clear, lungs clear).  Plan: 1. Rapid strep negative today 2. Reassurance, likely viral etiology of pharyngitis, should be self limited within 7-10 days 3. Recommended prolonged trial on Flonase, sent rx in, use for 2-4 weeks 4. Improve hydration, warm herbal tea with honey 5. Ibuprofen PRN 6. Return criteria    Meds ordered this encounter  Medications  . fluticasone (FLONASE) 50 MCG/ACT nasal spray    Sig:  Place 1 spray into both nostrils daily.    Dispense:  16 g    Refill:  0      Follow up plan: Return in about 2 weeks (around 11/25/2015), or if symptoms worsen or fail to improve, for pharyngitis / allergic rhinitis.  Saralyn PilarAlexander Bolivar Koranda, DO [facility], PGY-3

## 2015-11-11 NOTE — Patient Instructions (Signed)
Thank you for coming in to clinic today.  1. It sounds like you have persistent Sinus Congestion or "Rhinosinusitis" - I do not think that this is a Bacterial Sinus Infection, no sign of ear infection or pneumonia. Strep throat test was NEGATIVE. Usually these are caused by Viruses or Allergies, and will run it's course in about 7 to 10 days. - No antibiotics are needed - Improve hydration by drinking plenty of clear fluids (water, gatorade) to reduce secretions and thin congestion - Start taking Flonase nasal spray 1 spray in each nostril each day for next 2 to 4 weeks, may need for longer - Recommend to start keep using Nasal Saline spray multiple times a day to help flush out congestion and clear sinuses - Continue Loratadine daily - Congestion draining down throat can cause irritation. May try warm herbal tea with honey, cough drops - Tylenol / Ibuprofen as needed for fevers  If you develop persistent fever >101F for at least 3 consecutive days, headaches with sinus pain or pressure or persistent earache, please schedule a follow-up evaluation within next few days to week.  Please schedule a follow-up appointment with Dr Leonides Schanzorsey within 2 weeks if not improving Pharyngitis / Allergic Rhinitis  If you have any other questions or concerns, please feel free to call the clinic to contact me. You may also schedule an earlier appointment if necessary.  However, if your symptoms get significantly worse, please go to the Emergency Department to seek immediate medical attention.  Saralyn PilarAlexander Aleena Kirkeby, DO [facility]

## 2015-11-11 NOTE — Telephone Encounter (Signed)
NCV/EMG test [place] per his request.

## 2015-11-15 ENCOUNTER — Ambulatory Visit (HOSPITAL_COMMUNITY)
Admission: RE | Admit: 2015-11-15 | Discharge: 2015-11-15 | Disposition: A | Discharge status: Home / Self Care | Payer: BLUE CROSS/BLUE SHIELD | Attending: Psychiatry | Admitting: Psychiatry

## 2015-11-15 ENCOUNTER — Encounter (HOSPITAL_COMMUNITY): Payer: Self-pay | Admitting: Emergency Medicine

## 2015-11-15 DIAGNOSIS — E119 Type 2 diabetes mellitus without complications: Secondary | ICD-10-CM | POA: Insufficient documentation

## 2015-11-15 DIAGNOSIS — L309 Dermatitis, unspecified: Secondary | ICD-10-CM | POA: Diagnosis not present

## 2015-11-15 DIAGNOSIS — F209 Schizophrenia, unspecified: Secondary | ICD-10-CM | POA: Diagnosis present

## 2015-11-15 DIAGNOSIS — E291 Testicular hypofunction: Secondary | ICD-10-CM | POA: Insufficient documentation

## 2015-11-15 DIAGNOSIS — I10 Essential (primary) hypertension: Secondary | ICD-10-CM | POA: Insufficient documentation

## 2015-11-15 DIAGNOSIS — F1021 Alcohol dependence, in remission: Secondary | ICD-10-CM | POA: Insufficient documentation

## 2015-11-15 DIAGNOSIS — E559 Vitamin D deficiency, unspecified: Secondary | ICD-10-CM | POA: Insufficient documentation
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
## 2015-11-21 ENCOUNTER — Emergency Department (HOSPITAL_COMMUNITY): Payer: BLUE CROSS/BLUE SHIELD

## 2015-11-21 ENCOUNTER — Emergency Department (HOSPITAL_COMMUNITY)
Admission: EM | Admit: 2015-11-21 | Discharge: 2015-11-21 | Disposition: A | Discharge status: Home / Self Care | Payer: BLUE CROSS/BLUE SHIELD | Attending: Emergency Medicine | Admitting: Emergency Medicine

## 2015-11-21 ENCOUNTER — Encounter (HOSPITAL_COMMUNITY): Payer: Self-pay | Admitting: *Deleted

## 2015-11-21 DIAGNOSIS — Z7982 Long term (current) use of aspirin: Secondary | ICD-10-CM | POA: Diagnosis not present

## 2015-11-21 DIAGNOSIS — M79632 Pain in left forearm: Secondary | ICD-10-CM | POA: Insufficient documentation

## 2015-11-21 DIAGNOSIS — I10 Essential (primary) hypertension: Secondary | ICD-10-CM | POA: Insufficient documentation

## 2015-11-21 DIAGNOSIS — E119 Type 2 diabetes mellitus without complications: Secondary | ICD-10-CM | POA: Insufficient documentation

## 2015-11-21 DIAGNOSIS — Z79899 Other long term (current) drug therapy: Secondary | ICD-10-CM | POA: Insufficient documentation

## 2015-11-21 DIAGNOSIS — Z794 Long term (current) use of insulin: Secondary | ICD-10-CM | POA: Insufficient documentation

## 2015-11-21 DIAGNOSIS — Z7984 Long term (current) use of oral hypoglycemic drugs: Secondary | ICD-10-CM | POA: Insufficient documentation

## 2015-11-21 MED ORDER — IBUPROFEN 600 MG PO TABS: 600.0000 mg | ORAL_TABLET | Freq: Three times a day (TID) | ORAL | Status: DC | PRN

## 2015-11-21 NOTE — ED Notes (Addendum)
Pt tripped on the sidewalk and fell yesterday.  Pt denies LOC or hit his head.  Pt broke his fall with his left arm.  Pt reports full range of motion.  Hx DM.  Rates pain 6 out of 10. No obvious deformity noted.
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
## 2015-11-21 NOTE — Discharge Instructions (Signed)
Read the information below.  Use the prescribed medication as directed.  Please discuss all new medications with your pharmacist.  You may return to the Emergency Department at any time for worsening condition or any new symptoms that concern you.  If you develop uncontrolled pain, weakness or numbness of the extremity, severe discoloration of the skin, or you are unable to use your arm, return to the ER for a recheck.     Musculoskeletal Pain Musculoskeletal pain is muscle and boney aches and pains. These pains can occur in any part of the body. Your caregiver may treat you without knowing the cause of the pain. They may treat you if blood or urine tests, X-rays, and other tests were normal.  CAUSES There is often not a definite cause or reason for these pains. These pains may be caused by a type of germ (virus). The discomfort may also come from overuse. Overuse includes working out too hard when your body is not fit. Boney aches also come from weather changes. Bone is sensitive to atmospheric pressure changes. HOME CARE INSTRUCTIONS   Ask when your test results will be ready. Make sure you get your test results.  Only take over-the-counter or prescription medicines for pain, discomfort, or fever as directed by your caregiver. If you were given medications for your condition, do not drive, operate machinery or power tools, or sign legal documents for 24 hours. Do not drink alcohol. Do not take sleeping pills or other medications that may interfere with treatment.  Continue all activities unless the activities cause more pain. When the pain lessens, slowly resume normal activities. Gradually increase the intensity and duration of the activities or exercise.  During periods of severe pain, bed rest may be helpful. Lay or sit in any position that is comfortable.  Putting ice on the injured area.  Put ice in a bag.  Place a towel between your skin and the bag.  Leave the ice on for 15 to 20  minutes, 3 to 4 times a day.  Follow up with your caregiver for continued problems and no reason can be found for the pain. If the pain becomes worse or does not go away, it may be necessary to repeat tests or do additional testing. Your caregiver may need to look further for a possible cause. SEEK IMMEDIATE MEDICAL CARE IF:  You have pain that is getting worse and is not relieved by medications.  You develop chest pain that is associated with shortness or breath, sweating, feeling sick to your stomach (nauseous), or throw up (vomit).  Your pain becomes localized to the abdomen.  You develop any new symptoms that seem different or that concern you. MAKE SURE YOU:   Understand these instructions.  Will watch your condition.  Will get help right away if you are not doing well or get worse.   This information is not intended to replace advice given to you by your health care provider. Make sure you discuss any questions you have with your health care provider.   Document Released: 05/01/2005 Document Revised: 07/24/2011 Document Reviewed: 01/03/2013 Elsevier Interactive Patient Education [employer] Inc.

## 2015-11-22 ENCOUNTER — Encounter: Payer: Self-pay | Admitting: Internal Medicine

## 2015-11-22 ENCOUNTER — Ambulatory Visit (INDEPENDENT_AMBULATORY_CARE_PROVIDER_SITE_OTHER): Payer: BLUE CROSS/BLUE SHIELD | Admitting: Internal Medicine

## 2015-11-22 VITALS — BP 120/82 | HR 105 | Temp 98.0°F | Ht 67.0 in | Wt 258.8 lb

## 2015-11-22 DIAGNOSIS — M79632 Pain in left forearm: Secondary | ICD-10-CM

## 2015-11-22 NOTE — Patient Instructions (Signed)
Use Motrin (Ibuprofen) for pain along with your other home pain medications. Ice the area a few times per day to help decrease swelling. Follow up with your PCP if pain has not gotten better in the next 2 weeks.

## 2015-11-22 NOTE — Assessment & Plan Note (Signed)
Related to mechanical fall. Exam benign and patient neurovascularly intact. Xray from ED reviewed and without fracture or bony abnormality.  -continue with supportive care: ice, Ibuprofen, rest  -return if pain not improving in 2 weeks
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
## 2015-11-26 ENCOUNTER — Ambulatory Visit: Payer: Self-pay | Admitting: Internal Medicine

## 2015-11-29 ENCOUNTER — Ambulatory Visit (INDEPENDENT_AMBULATORY_CARE_PROVIDER_SITE_OTHER): Payer: BLUE CROSS/BLUE SHIELD | Admitting: Internal Medicine

## 2015-11-29 ENCOUNTER — Encounter: Payer: Self-pay | Admitting: Internal Medicine

## 2015-11-29 VITALS — BP 107/82 | HR 93 | Temp 98.3°F | Ht 67.0 in | Wt 260.2 lb

## 2015-11-29 DIAGNOSIS — T148 Other injury of unspecified body region: Secondary | ICD-10-CM | POA: Diagnosis not present

## 2015-11-29 DIAGNOSIS — M545 Low back pain, unspecified: Secondary | ICD-10-CM

## 2015-11-29 DIAGNOSIS — T148XXA Other injury of unspecified body region, initial encounter: Secondary | ICD-10-CM

## 2015-11-29 DIAGNOSIS — M549 Dorsalgia, unspecified: Secondary | ICD-10-CM | POA: Insufficient documentation

## 2015-11-29 MED ORDER — CYCLOBENZAPRINE HCL 10 MG PO TABS: 10.0000 mg | ORAL_TABLET | Freq: Three times a day (TID) | ORAL | Status: DC | PRN

## 2015-11-29 MED ORDER — MELOXICAM 15 MG PO TABS: 15.0000 mg | ORAL_TABLET | Freq: Every day | ORAL | Status: DC

## 2015-11-29 NOTE — Patient Instructions (Signed)
Take Flexeril and Mobic for your back pain. Apply warm compresses or heating pads to the area to help with pain.  

## 2015-11-29 NOTE — Assessment & Plan Note (Addendum)
Appears to be muscular in nature given tight paraspinal muscles bilaterally. Possibly related to recent fall vs. Chronic compensation for reported history of leg injury requiring assistive devices for ambulation.  -Rx for flexeril and mobic given -heat to the area -f/u as needed
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
## 2015-12-06 ENCOUNTER — Encounter: Payer: Self-pay | Admitting: Family Medicine

## 2015-12-06 ENCOUNTER — Emergency Department (HOSPITAL_COMMUNITY): Payer: BLUE CROSS/BLUE SHIELD

## 2015-12-06 ENCOUNTER — Ambulatory Visit (INDEPENDENT_AMBULATORY_CARE_PROVIDER_SITE_OTHER): Payer: BLUE CROSS/BLUE SHIELD | Admitting: Family Medicine

## 2015-12-06 ENCOUNTER — Encounter (HOSPITAL_COMMUNITY): Payer: Self-pay | Admitting: Emergency Medicine

## 2015-12-06 ENCOUNTER — Emergency Department (HOSPITAL_COMMUNITY)
Admission: EM | Admit: 2015-12-06 | Discharge: 2015-12-06 | Disposition: A | Discharge status: Home / Self Care | Payer: BLUE CROSS/BLUE SHIELD | Attending: Emergency Medicine | Admitting: Emergency Medicine

## 2015-12-06 VITALS — BP 133/97 | HR 107 | Temp 98.5°F | Wt 260.0 lb

## 2015-12-06 DIAGNOSIS — M79632 Pain in left forearm: Secondary | ICD-10-CM | POA: Diagnosis not present

## 2015-12-06 DIAGNOSIS — M546 Pain in thoracic spine: Secondary | ICD-10-CM

## 2015-12-06 DIAGNOSIS — I10 Essential (primary) hypertension: Secondary | ICD-10-CM | POA: Insufficient documentation

## 2015-12-06 DIAGNOSIS — T148 Other injury of unspecified body region: Secondary | ICD-10-CM

## 2015-12-06 DIAGNOSIS — Z794 Long term (current) use of insulin: Secondary | ICD-10-CM | POA: Insufficient documentation

## 2015-12-06 DIAGNOSIS — M79602 Pain in left arm: Secondary | ICD-10-CM

## 2015-12-06 DIAGNOSIS — Z79899 Other long term (current) drug therapy: Secondary | ICD-10-CM | POA: Insufficient documentation

## 2015-12-06 DIAGNOSIS — Z7982 Long term (current) use of aspirin: Secondary | ICD-10-CM | POA: Insufficient documentation

## 2015-12-06 DIAGNOSIS — Z7984 Long term (current) use of oral hypoglycemic drugs: Secondary | ICD-10-CM | POA: Insufficient documentation

## 2015-12-06 DIAGNOSIS — E1165 Type 2 diabetes mellitus with hyperglycemia: Secondary | ICD-10-CM

## 2015-12-06 DIAGNOSIS — T148XXA Other injury of unspecified body region, initial encounter: Secondary | ICD-10-CM

## 2015-12-06 LAB — CBC
HCT: 35.8 % — ABNORMAL LOW (ref 39.0–52.0)
Hemoglobin: 11.1 g/dL — ABNORMAL LOW (ref 13.0–17.0)
MCH: 28 pg (ref 26.0–34.0)
MCHC: 31 g/dL (ref 30.0–36.0)
MCV: 90.2 fL (ref 78.0–100.0)
Platelets: 288 10*3/uL (ref 150–400)
RBC: 3.97 MIL/uL — ABNORMAL LOW (ref 4.22–5.81)
RDW: 12.7 % (ref 11.5–15.5)
WBC: 5.9 10*3/uL (ref 4.0–10.5)

## 2015-12-06 LAB — URINALYSIS, ROUTINE W REFLEX MICROSCOPIC
Bilirubin Urine: NEGATIVE
Glucose, UA: >1000 mg/dL — AB
Hgb urine dipstick: NEGATIVE
Ketones, ur: NEGATIVE mg/dL
Leukocytes, UA: NEGATIVE
Nitrite: NEGATIVE
Protein, ur: NEGATIVE mg/dL
Specific Gravity, Urine: 1.037 — ABNORMAL HIGH (ref 1.005–1.030)
pH: 5.5 (ref 5.0–8.0)

## 2015-12-06 LAB — BASIC METABOLIC PANEL
Anion gap: 10 (ref 5–15)
BUN: 9 mg/dL (ref 6–20)
CO2: 25 mmol/L (ref 22–32)
Calcium: 9.3 mg/dL (ref 8.9–10.3)
Chloride: 101 mmol/L (ref 101–111)
Creatinine, Ser: 1.26 mg/dL — ABNORMAL HIGH (ref 0.61–1.24)
GFR calc Af Amer: >60 mL/min (ref >60)
GFR calc non Af Amer: >60 mL/min (ref >60)
Glucose, Bld: 357 mg/dL — ABNORMAL HIGH (ref 65–99)
Potassium: 4.5 mmol/L (ref 3.5–5.1)
Sodium: 136 mmol/L (ref 135–145)

## 2015-12-06 LAB — CBG MONITORING, ED
Glucose-Capillary: 381 mg/dL — ABNORMAL HIGH (ref 65–99)
Glucose-Capillary: 297 mg/dL — ABNORMAL HIGH (ref 65–99)

## 2015-12-06 LAB — URINE MICROSCOPIC-ADD ON
Bacteria, UA: NONE SEEN
RBC / HPF: NONE SEEN RBC/hpf (ref 0–5)
WBC, UA: NONE SEEN WBC/hpf (ref 0–5)

## 2015-12-06 MED ORDER — GABAPENTIN 300 MG PO CAPS: ORAL_CAPSULE | ORAL | 1 refills | Status: DC

## 2015-12-06 MED ORDER — SODIUM CHLORIDE 0.9 % IV BOLUS (SEPSIS)
1000.0000 mL | Freq: Once | INTRAVENOUS | Status: AC
  Administered 2015-12-06: 1000 mL via INTRAVENOUS

## 2015-12-06 MED ORDER — MELOXICAM 15 MG PO TABS: 15.0000 mg | ORAL_TABLET | Freq: Every day | ORAL | 0 refills | Status: DC

## 2015-12-06 MED ORDER — TRAMADOL HCL 50 MG PO TABS
50.0000 mg | ORAL_TABLET | Freq: Once | ORAL | Status: AC
  Administered 2015-12-06: 50 mg via ORAL
  Filled 2015-12-06: qty 1

## 2015-12-06 MED ORDER — CYCLOBENZAPRINE HCL 10 MG PO TABS: 10.0000 mg | ORAL_TABLET | Freq: Three times a day (TID) | ORAL | 0 refills | Status: DC | PRN

## 2015-12-06 MED ORDER — METOCLOPRAMIDE HCL 5 MG/ML IJ SOLN
10.0000 mg | Freq: Once | INTRAMUSCULAR | Status: AC
  Administered 2015-12-06: 10 mg via INTRAVENOUS
  Filled 2015-12-06: qty 2

## 2015-12-06 MED ORDER — METHYLPREDNISOLONE ACETATE 40 MG/ML IJ SUSP
40.0000 mg | Freq: Once | INTRAMUSCULAR | Status: AC
  Administered 2015-12-06: 40 mg via INTRAMUSCULAR

## 2015-12-06 MED ORDER — INSULIN ASPART 100 UNIT/ML ~~LOC~~ SOLN
10.0000 [IU] | Freq: Once | SUBCUTANEOUS | Status: AC
  Administered 2015-12-06: 10 [IU] via SUBCUTANEOUS
  Filled 2015-12-06: qty 1

## 2015-12-06 MED ORDER — PREDNISONE 20 MG PO TABS: ORAL_TABLET | ORAL | 0 refills | Status: DC

## 2015-12-06 MED ORDER — TRAMADOL HCL 50 MG PO TABS: 50.0000 mg | ORAL_TABLET | Freq: Two times a day (BID) | ORAL | 0 refills | Status: DC | PRN

## 2015-12-06 NOTE — ED Notes (Signed)
Bed: WA01 Expected date:  Expected time:  Means of arrival:  Comments: ems 

## 2015-12-06 NOTE — ED Notes (Signed)
Nurse is collecting the blood
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
## 2015-12-06 NOTE — Assessment & Plan Note (Addendum)
While the patient's back pain appears to be muscular in nature as there is some spasticity in the paraspinal muscles, I am concerned about spinous injury given his significant tenderness over the spinous processes.  It seems that his pain has not been incredibly well controlled with Flexeril and Mobic.  Additionally, he is noted to have some weakness of his left upper extremity compared to the right on my exam (which is opposite of what he feels).  He has generalized weakness in all muscles however sot it is a difficult exam. No loss of bowel or urinary incontinence.   -Continue Flexeril and Mobic daily  -We'll give a dose of Depo-Medrol 40 mg IM today -Continue to use heat over the area -We'll obtain a thoracic x-ray given his continued pain over the spinous processes - return precautions discussed.

## 2015-12-06 NOTE — ED Provider Notes (Signed)
Patient care assumed from [place], [place] at shift change. He is pending improvement to his CBG. IVF given as well as 10u SubQ insulin for hyperglycemia. No evidence of DKA on work up. Pain, for which patient presented, has improved with Tramadol. Case discussed with Sam, PA-C which includes patient discharge with Tramadol with improving blood sugar.   Repeat CBG now <300. No indication for further monitoring. Will discharge with PCP follow up. Return precautions given at discharge. Patient discharged in satisfactory condition.   Results for orders placed or performed during the hospital encounter of 12/06/15  Basic metabolic panel  Result Value Ref Range   Sodium 136 135 - 145 mmol/L   Potassium 4.5 3.5 - 5.1 mmol/L   Chloride 101 101 - 111 mmol/L   CO2 25 22 - 32 mmol/L   Glucose, Bld 357 (H) 65 - 99 mg/dL   BUN 9 6 - 20 mg/dL   Creatinine, Ser 0.22 (H) 0.61 - 1.24 mg/dL   Calcium 9.3 8.9 - 33.6 mg/dL   GFR calc non Af Amer >60 >60 mL/min   GFR calc Af Amer >60 >60 mL/min   Anion gap 10 5 - 15  CBC  Result Value Ref Range   WBC 5.9 4.0 - 10.5 K/uL   RBC 3.97 (L) 4.22 - 5.81 MIL/uL   Hemoglobin 11.1 (L) 13.0 - 17.0 g/dL   HCT 12.2 (L) 44.9 - 75.3 %   MCV 90.2 78.0 - 100.0 fL   MCH 28.0 26.0 - 34.0 pg   MCHC 31.0 30.0 - 36.0 g/dL   RDW 00.5 11.0 - 21.1 %   Platelets 288 150 - 400 K/uL  Urinalysis, Routine w reflex microscopic  Result Value Ref Range   Color, Urine YELLOW YELLOW   APPearance CLEAR CLEAR   Specific Gravity, Urine 1.037 (H) 1.005 - 1.030   pH 5.5 5.0 - 8.0   Glucose, UA >1000 (A) NEGATIVE mg/dL   Hgb urine dipstick NEGATIVE NEGATIVE   Bilirubin Urine NEGATIVE NEGATIVE   Ketones, ur NEGATIVE NEGATIVE mg/dL   Protein, ur NEGATIVE NEGATIVE mg/dL   Nitrite NEGATIVE NEGATIVE   Leukocytes, UA NEGATIVE NEGATIVE  Urine microscopic-add on  Result Value Ref Range   Squamous Epithelial / LPF 0-5 (A) NONE SEEN   WBC, UA NONE SEEN 0 - 5 WBC/hpf   RBC / HPF NONE SEEN 0  - 5 RBC/hpf   Bacteria, UA NONE SEEN NONE SEEN  CBG monitoring, ED  Result Value Ref Range   Glucose-Capillary 381 (H) 65 - 99 mg/dL  POC CBG, ED  Result Value Ref Range   Glucose-Capillary 297 (H) 65 - 99 mg/dL   Dg Thoracic Spine 2 View  Result Date: 12/06/2015 CLINICAL DATA:  Persistent mid back pain after falling 2 weeks ago. EXAM: THORACIC SPINE 2 VIEWS COMPARISON:  Chest radiographs 08/07/2014.  Chest CT 08/05/2014. FINDINGS: Conventional anatomy with 12 rib-bearing thoracic type vertebral bodies. There is a mild convex right scoliosis. The cervical thoracic junction is not well visualized in the lateral projection, despite performance of two swimmer's views. There is no evidence of acute fracture, paraspinal hematoma or widening of the interpedicular distance. IMPRESSION: No evidence of acute thoracic spine injury. The cervical thoracic junction is not well visualized. Electronically Signed   By: Carey Bullocks M.D.   On: 12/06/2015 19:44     Antony Madura, PA-C 12/06/15 2213    Leta Baptist, MD 12/08/15 [phone]

## 2015-12-06 NOTE — ED Triage Notes (Signed)
Pt called EMS today after having chronic L leg pain and L arm pain from a fall 2 weeks ago. Pt is also hyperglycemic. CBG 425,. States he has not been taking his insulin and just restarted his metformin. Also c/o SOB. Alert and oriented. 98% RA.
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
## 2015-12-06 NOTE — Patient Instructions (Signed)
Prescribed use steroid taper to help with the pain in your left elbow. I have ordered a x-ray of your thoracic spine. Continue to take Mobic for pain. Do not take ibuprofen, Advil, aspirin, Aleve while you're taking this medication. Continue to take Flexeril as needed for pain.

## 2015-12-07 NOTE — Assessment & Plan Note (Signed)
Seems to be stable from his initial fall. No red flags on exam. Unusual that he has tenderness over the tricep itself. From my exam, he may have a component of medial, and to a lesser extent, lateral epicondylitis. - will give a shot of Depo-Medrol 40mg  IM - Steroid taper - Given the extent of his symptoms, I do not know if he would benefit from intraarticular steroid injection at this time. - continue ice, Mobic, and rest.

## 2015-12-13 ENCOUNTER — Ambulatory Visit (INDEPENDENT_AMBULATORY_CARE_PROVIDER_SITE_OTHER): Payer: BLUE CROSS/BLUE SHIELD | Admitting: Internal Medicine

## 2015-12-13 ENCOUNTER — Encounter: Payer: Self-pay | Admitting: Internal Medicine

## 2015-12-13 VITALS — BP 129/99 | HR 110 | Temp 98.5°F | Wt 255.0 lb

## 2015-12-13 DIAGNOSIS — M79632 Pain in left forearm: Secondary | ICD-10-CM | POA: Diagnosis not present

## 2015-12-13 DIAGNOSIS — E118 Type 2 diabetes mellitus with unspecified complications: Secondary | ICD-10-CM | POA: Diagnosis not present

## 2015-12-13 LAB — POCT GLYCOSYLATED HEMOGLOBIN (HGB A1C): Hemoglobin A1C: 11.8

## 2015-12-13 NOTE — Progress Notes (Signed)
   [facility] Phone: [phone]   Date of Visit: 12/13/2015   HPI:  Marcus [name] is a 40 y.o. male presenting to clinic today for same day appointment. PCP: Rodrigo Ran, MD Concerns today include:  Follow up for Left Forearm Pain:  - initially started after a fall. He tripped and fell on his left forearm and elbow. He was seen in the ED on 7/9 and in clinic on 7/17 and 7/24. He had an x-ray done in the ED which was unremarkable.  - intermittent throbbing pain which is actually improving per patient since the incident - Has been using mobic daily and flexeril daily which he reports is helping. Of note, he was also prescribed a prednisone taper at last visit. - reports he has not been icing the area because he forgot - reports that he had numbness intermittently on his left dorsal hand which occurred after he had an injury to his wrist years ago; this is not new and this has not worsened.   ROS: See HPI.  PHYSICAL EXAM: BP (!) 129/99   Pulse (!) 110   Temp 98.5 F (36.9 C) (Oral)   Wt 255 lb (115.7 kg)   SpO2 96%   BMI 39.94 kg/m  Gen: NAD Left Forearm: no swelling or erythema noted. Has some scares from old laceration repairs near wrists bilaterally. TTP of left dorsal forearm, medial epicondyle > lateral epicondyle. No swelling. Hand grip is decreased but this is bilateral and equal and per patient is chronic. Forearm flexion strength is normal bilaterally. Reports of pain in forearm with wrist extension with resistance. Neurovascularly intact.   ASSESSMENT/PLAN:  Left forearm pain Forearm pain along with medial > lateral epicondylitis of left elbow. Symptoms are improving with supportive management. No red flags noted. Reminded patient to ice (has not been doing this). Continue Mobic and Flexeril PRN. Provided stretching exercises for epicondylitis. Follow up if symptoms do not continue to improve. Consider sports medicine referral if symptoms  persist.   FOLLOW UP: Follow up if symptoms do not resolve  Palma Holter, MD PGY 2 [facility]

## 2015-12-13 NOTE — Assessment & Plan Note (Signed)
Forearm pain along with medial > lateral epicondylitis of left elbow. Symptoms are improving with supportive management. No red flags noted. Reminded patient to ice (has not been doing this). Continue Mobic and Flexeril PRN. Provided stretching exercises for epicondylitis. Follow up if symptoms do not continue to improve. Consider sports medicine referral if symptoms persist.

## 2015-12-13 NOTE — Patient Instructions (Signed)
Medial Epicondylitis With Rehab Medial epicondylitis involves inflammation and pain around the inner (medial) portion of the elbow. This pain is caused by inflammation of the tendons in the forearm that flex (bring down) the wrist. Medial epicondylitis is also called golfer's elbow, because it is common among golfers. However, it may occur in any individual who flexes the wrist regularly. If medial epicondylitis is left untreated, it may become a chronic problem. SYMPTOMS   Pain, tenderness, or inflammation over the inner (medial) side of the elbow.  Pain or weakness with gripping activities.  Pain that increases with wrist twisting motions (using a screwdriver, playing golf, bowling). CAUSES  Medial epicondylitis is caused by inflammation of the tendons that flex the wrist. Causes of injury may include:  Chronic, repetitive stress and strain to the tendons that run from the wrist and forearm to the elbow.  Sudden strain on the forearm, including wrist snap when serving balls with racquet sports, or throwing a baseball. RISK INCREASES WITH:  Sports or occupations that require repetitive and/or strenuous forearm and wrist movements (pitching a baseball, golfing, carpentry).  Poor wrist and forearm strength and flexibility.  Failure to warm up properly before activity.  Resuming activity before healing, rehabilitation, and conditioning are complete. PREVENTION   Warm up and stretch properly before activity.  Maintain physical fitness:  Strength, flexibility, and endurance.  Cardiovascular fitness.  Wear and use properly fitted equipment.  Learn and use proper technique and have a coach correct improper technique.  Wear a tennis elbow (counterforce) brace. PROGNOSIS  The course of this condition depends on the degree of the injury. If treated properly, acute cases (symptoms lasting less than 4 weeks) are often resolved in 2 to 6 weeks. Chronic (longer lasting cases) often resolve  in 3 to 6 months, but may require physical therapy. RELATED COMPLICATIONS   Frequently recurring symptoms, resulting in a chronic problem. Properly treating the problem the first time decreases frequency of recurrence.  Chronic inflammation, scarring, and partial tendon tear, requiring surgery.  Delayed healing or resolution of symptoms. TREATMENT  Treatment first involves the use of ice and medicine, to reduce pain and inflammation. Strengthening and stretching exercises may reduce discomfort, if performed regularly. These exercises may be performed at home, if the condition is an acute injury. Chronic cases may require a referral to a physical therapist for evaluation and treatment. Your caregiver may advise a corticosteroid injection to help reduce inflammation. Rarely, surgery is needed. MEDICATION  If pain medicine is needed, nonsteroidal anti-inflammatory medicines (aspirin and ibuprofen), or other minor pain relievers (acetaminophen), are often advised.  Do not take pain medicine for 7 days before surgery.  Prescription pain relievers may be given, if your caregiver thinks they are needed. Use only as directed and only as much as you need.  Corticosteroid injections may be recommended. These injections should be reserved only for the most severe cases, because they can only be given a certain number of times. HEAT AND COLD  Cold treatment (icing) should be applied for 10 to 15 minutes every 2 to 3 hours for inflammation and pain, and immediately after activity that aggravates your symptoms. Use ice packs or an ice massage.  Heat treatment may be used before performing stretching and strengthening activities prescribed by your caregiver, physical therapist, or athletic trainer. Use a heat pack or a warm water soak. SEEK MEDICAL CARE IF: Symptoms get worse or do not improve in 2 weeks, despite treatment. EXERCISES  RANGE OF MOTION (ROM) AND   STRETCHING EXERCISES - Epicondylitis, Medial  (Golfer's Elbow) These exercises may help you when beginning to rehabilitate your injury. Your symptoms may go away with or without further involvement from your physician, physical therapist or athletic trainer. While completing these exercises, remember:   Restoring tissue flexibility helps normal motion to return to the joints. This allows healthier, less painful movement and activity.  An effective stretch should be held for at least 30 seconds.  A stretch should never be painful. You should only feel a gentle lengthening or release in the stretched tissue. RANGE OF MOTION - Wrist Flexion, Active-Assisted  Extend your right / left elbow with your fingers pointing down.*  Gently pull the back of your hand towards you, until you feel a gentle stretch on the top of your forearm.  Hold this position for __________ seconds. Repeat __________ times. Complete this exercise __________ times per day.  *If directed by your physician, physical therapist or athletic trainer, complete this stretch with your elbow bent, rather than extended. RANGE OF MOTION - Wrist Extension, Active-Assisted  Extend your right / left elbow and turn your palm upwards.*  Gently pull your palm and fingertips back, so your wrist extends and your fingers point more toward the ground.  You should feel a gentle stretch on the inside of your forearm.  Hold this position for __________ seconds. Repeat __________ times. Complete this exercise __________ times per day. *If directed by your physician, physical therapist or athletic trainer, complete this stretch with your elbow bent, rather than extended. STRETCH - Wrist Extension   Place your right / left fingertips on a tabletop leaving your elbow slightly bent. Your fingers should point backwards.  Gently press your fingers and palm down onto the table, by straightening your elbow. You should feel a stretch on the inside of your forearm.  Hold this position for  __________ seconds. Repeat __________ times. Complete this stretch __________ times per day.  STRENGTHENING EXERCISES - Epicondylitis, Medial (Golfer's Elbow) These exercises may help you when beginning to rehabilitate your injury. They may resolve your symptoms with or without further involvement from your physician, physical therapist or athletic trainer. While completing these exercises, remember:   Muscles can gain both the endurance and the strength needed for everyday activities through controlled exercises.  Complete these exercises as instructed by your physician, physical therapist or athletic trainer. Increase the resistance and repetitions only as guided.  You may experience muscle soreness or fatigue, but the pain or discomfort you are trying to eliminate should never worsen during these exercises. If this pain does get worse, stop and make sure you are following the directions exactly. If the pain is still present after adjustments, discontinue the exercise until you can discuss the trouble with your caregiver. STRENGTH - Wrist Flexors  Sit with your right / left forearm palm-up, and fully supported on a table or countertop. Your elbow should be resting below the height of your shoulder. Allow your wrist to extend over the edge of the surface.  Loosely holding a __________ weight, or a piece of rubber exercise band or tubing, slowly curl your hand up toward your forearm.  Hold this position for __________ seconds. Slowly lower the wrist back to the starting position in a controlled manner. Repeat __________ times. Complete this exercise __________ times per day.  STRENGTH - Wrist Extensors  Sit with your right / left forearm palm-down and fully supported. Your elbow should be resting below the height of your shoulder. Allow your   wrist to extend over the edge of the surface.  Loosely holding a __________ weight, or a piece of rubber exercise band or tubing, slowly curl your hand up  toward your forearm.  Hold this position for __________ seconds. Slowly lower the wrist back to the starting position in a controlled manner. Repeat __________ times. Complete this exercise __________ times per day.  STRENGTH - Ulnar Deviators  Stand with a ____________________ weight in your right / left hand, or sit while holding a rubber exercise band or tubing, with your healthy arm supported on a table or countertop.  Move your wrist so that your pinkie travels toward your forearm and your thumb moves away from your forearm.  Hold this position for __________ seconds and then slowly lower the wrist back to the starting position. Repeat __________ times. Complete this exercise __________ times per day STRENGTH - Grip   Grasp a tennis ball, a dense sponge, or a large, rolled sock in your hand.  Squeeze as hard as you can, without increasing any pain.  Hold this position for __________ seconds. Release your grip slowly. Repeat __________ times. Complete this exercise __________ times per day.  STRENGTH - Forearm Supinators   Sit with your right / left forearm supported on a table, keeping your elbow below shoulder height. Rest your hand over the edge, palm down.  Gently grip a hammer or a soup ladle.  Without moving your elbow, slowly turn your palm and hand upward to a "thumbs-up" position.  Hold this position for __________ seconds. Slowly return to the starting position. Repeat __________ times. Complete this exercise __________ times per day.  STRENGTH - Forearm Pronators  Sit with your right / left forearm supported on a table, keeping your elbow below shoulder height. Rest your hand over the edge, palm up.  Gently grip a hammer or a soup ladle.  Without moving your elbow, slowly turn your palm and hand upward to a "thumbs-up" position.  Hold this position for __________ seconds. Slowly return to the starting position. Repeat __________ times. Complete this exercise  __________ times per day.    This information is not intended to replace advice given to you by your health care provider. Make sure you discuss any questions you have with your health care provider.   Document Released: 05/01/2005 Document Revised: 05/22/2014 Document Reviewed: 08/13/2008 [employer] Interactive Patient Education 2016 [employer]    Lateral Epicondylitis With Rehab Lateral epicondylitis involves inflammation and pain around the outer portion of the elbow. The pain is caused by inflammation of the tendons in the forearm that bring back (extend) the wrist. Lateral epicondylitis is also called tennis elbow, because it is very common in tennis players. However, it may occur in any individual who extends the wrist repetitively. If lateral epicondylitis is left untreated, it may become a chronic problem. SYMPTOMS   Pain, tenderness, and inflammation on the outer (lateral) side of the elbow.  Pain or weakness with gripping activities.  Pain that increases with wrist-twisting motions (playing tennis, using a screwdriver, opening a door or a jar).  Pain with lifting objects, including a coffee cup. CAUSES  Lateral epicondylitis is caused by inflammation of the tendons that extend the wrist. Causes of injury may include:  Repetitive stress and strain on the muscles and tendons that extend the wrist.  Sudden change in activity level or intensity.  Incorrect grip in racquet sports.  Incorrect grip size of racquet (often too large).  Incorrect hitting position or technique (usually  backhand, leading with the elbow).  Using a racket that is too heavy. RISK INCREASES WITH:  Sports or occupations that require repetitive and/or strenuous forearm and wrist movements (tennis, squash, racquetball, carpentry).  Poor wrist and forearm strength and flexibility.  Failure to warm up properly before activity.  Resuming activity before healing, rehabilitation, and conditioning are  complete. PREVENTION   Warm up and stretch properly before activity.  Maintain physical fitness:  Strength, flexibility, and endurance.  Cardiovascular fitness.  Wear and use properly fitted equipment.  Learn and use proper technique and have a coach correct improper technique.  Wear a tennis elbow (counterforce) brace. PROGNOSIS  The course of this condition depends on the degree of the injury. If treated properly, acute cases (symptoms lasting less than 4 weeks) are often resolved in 2 to 6 weeks. Chronic (longer lasting cases) often resolve in 3 to 6 months but may require physical therapy. RELATED COMPLICATIONS   Frequently recurring symptoms, resulting in a chronic problem. Properly treating the problem the first time decreases frequency of recurrence.  Chronic inflammation, scarring tendon degeneration, and partial tendon tear, requiring surgery.  Delayed healing or resolution of symptoms. TREATMENT  Treatment first involves the use of ice and medicine to reduce pain and inflammation. Strengthening and stretching exercises may help reduce discomfort if performed regularly. These exercises may be performed at home if the condition is an acute injury. Chronic cases may require a referral to a physical therapist for evaluation and treatment. Your caregiver may advise a corticosteroid injection to help reduce inflammation. Rarely, surgery is needed. MEDICATION  If pain medicine is needed, nonsteroidal anti-inflammatory medicines (aspirin and ibuprofen), or other minor pain relievers (acetaminophen), are often advised.  Do not take pain medicine for 7 days before surgery.  Prescription pain relievers may be given, if your caregiver thinks they are needed. Use only as directed and only as much as you need.  Corticosteroid injections may be recommended. These injections should be reserved only for the most severe cases, because they can only be given a certain number of times. HEAT  AND COLD  Cold treatment (icing) should be applied for 10 to 15 minutes every 2 to 3 hours for inflammation and pain, and immediately after activity that aggravates your symptoms. Use ice packs or an ice massage.  Heat treatment may be used before performing stretching and strengthening activities prescribed by your caregiver, physical therapist, or athletic trainer. Use a heat pack or a warm water soak. SEEK MEDICAL CARE IF: Symptoms get worse or do not improve in 2 weeks, despite treatment. EXERCISES  RANGE OF MOTION (ROM) AND STRETCHING EXERCISES - Epicondylitis, Lateral (Tennis Elbow) These exercises may help you when beginning to rehabilitate your injury. Your symptoms may go away with or without further involvement from your physician, physical therapist, or athletic trainer. While completing these exercises, remember:   Restoring tissue flexibility helps normal motion to return to the joints. This allows healthier, less painful movement and activity.  An effective stretch should be held for at least 30 seconds.  A stretch should never be painful. You should only feel a gentle lengthening or release in the stretched tissue. RANGE OF MOTION - Wrist Flexion, Active-Assisted  Extend your right / left elbow with your fingers pointing down.*  Gently pull the back of your hand towards you, until you feel a gentle stretch on the top of your forearm.  Hold this position for __________ seconds. Repeat __________ times. Complete this exercise __________ times per  day.  *If directed by your physician, physical therapist or athletic trainer, complete this stretch with your elbow bent, rather than extended. RANGE OF MOTION - Wrist Extension, Active-Assisted  Extend your right / left elbow and turn your palm upwards.*  Gently pull your palm and fingertips back, so your wrist extends and your fingers point more toward the ground.  You should feel a gentle stretch on the inside of your  forearm.  Hold this position for __________ seconds. Repeat __________ times. Complete this exercise __________ times per day. *If directed by your physician, physical therapist or athletic trainer, complete this stretch with your elbow bent, rather than extended. STRETCH - Wrist Flexion  Place the back of your right / left hand on a tabletop, leaving your elbow slightly bent. Your fingers should point away from your body.  Gently press the back of your hand down onto the table by straightening your elbow. You should feel a stretch on the top of your forearm.  Hold this position for __________ seconds. Repeat __________ times. Complete this stretch __________ times per day.  STRETCH - Wrist Extension   Place your right / left fingertips on a tabletop, leaving your elbow slightly bent. Your fingers should point backwards.  Gently press your fingers and palm down onto the table by straightening your elbow. You should feel a stretch on the inside of your forearm.  Hold this position for __________ seconds. Repeat __________ times. Complete this stretch __________ times per day.  STRENGTHENING EXERCISES - Epicondylitis, Lateral (Tennis Elbow) These exercises may help you when beginning to rehabilitate your injury. They may resolve your symptoms with or without further involvement from your physician, physical therapist, or athletic trainer. While completing these exercises, remember:   Muscles can gain both the endurance and the strength needed for everyday activities through controlled exercises.  Complete these exercises as instructed by your physician, physical therapist or athletic trainer. Increase the resistance and repetitions only as guided.  You may experience muscle soreness or fatigue, but the pain or discomfort you are trying to eliminate should never worsen during these exercises. If this pain does get worse, stop and make sure you are following the directions exactly. If the  pain is still present after adjustments, discontinue the exercise until you can discuss the trouble with your caregiver. STRENGTH - Wrist Flexors  Sit with your right / left forearm palm-up and fully supported on a table or countertop. Your elbow should be resting below the height of your shoulder. Allow your wrist to extend over the edge of the surface.  Loosely holding a __________ weight, or a piece of rubber exercise band or tubing, slowly curl your hand up toward your forearm.  Hold this position for __________ seconds. Slowly lower the wrist back to the starting position in a controlled manner. Repeat __________ times. Complete this exercise __________ times per day.  STRENGTH - Wrist Extensors  Sit with your right / left forearm palm-down and fully supported on a table or countertop. Your elbow should be resting below the height of your shoulder. Allow your wrist to extend over the edge of the surface.  Loosely holding a __________ weight, or a piece of rubber exercise band or tubing, slowly curl your hand up toward your forearm.  Hold this position for __________ seconds. Slowly lower the wrist back to the starting position in a controlled manner. Repeat __________ times. Complete this exercise __________ times per day.  STRENGTH - Ulnar Deviators  Stand with a  ____________________ weight in your right / left hand, or sit while holding a rubber exercise band or tubing, with your healthy arm supported on a table or countertop.  Move your wrist, so that your pinkie travels toward your forearm and your thumb moves away from your forearm.  Hold this position for __________ seconds and then slowly lower the wrist back to the starting position. Repeat __________ times. Complete this exercise __________ times per day STRENGTH - Radial Deviators  Stand with a ____________________ weight in your right / left hand, or sit while holding a rubber exercise band or tubing, with your injured arm  supported on a table or countertop.  Raise your hand upward in front of you or pull up on the rubber tubing.  Hold this position for __________ seconds and then slowly lower the wrist back to the starting position. Repeat __________ times. Complete this exercise __________ times per day. STRENGTH - Forearm Supinators   Sit with your right / left forearm supported on a table, keeping your elbow below shoulder height. Rest your hand over the edge, palm down.  Gently grip a hammer or a soup ladle.  Without moving your elbow, slowly turn your palm and hand upward to a "thumbs-up" position.  Hold this position for __________ seconds. Slowly return to the starting position. Repeat __________ times. Complete this exercise __________ times per day.  STRENGTH - Forearm Pronators   Sit with your right / left forearm supported on a table, keeping your elbow below shoulder height. Rest your hand over the edge, palm up.  Gently grip a hammer or a soup ladle.  Without moving your elbow, slowly turn your palm and hand upward to a "thumbs-up" position.  Hold this position for __________ seconds. Slowly return to the starting position. Repeat __________ times. Complete this exercise __________ times per day.  STRENGTH - Grip  Grasp a tennis ball, a dense sponge, or a large, rolled sock in your hand.  Squeeze as hard as you can, without increasing any pain.  Hold this position for __________ seconds. Release your grip slowly. Repeat __________ times. Complete this exercise __________ times per day.  STRENGTH - Elbow Extensors, Isometric  Stand or sit upright, on a firm surface. Place your right / left arm so that your palm faces your stomach, and it is at the height of your waist.  Place your opposite hand on the underside of your forearm. Gently push up as your right / left arm resists. Push as hard as you can with both arms, without causing any pain or movement at your right / left elbow. Hold  this stationary position for __________ seconds. Gradually release the tension in both arms. Allow your muscles to relax completely before repeating.   This information is not intended to replace advice given to you by your health care provider. Make sure you discuss any questions you have with your health care provider.   Document Released: 05/01/2005 Document Revised: 05/22/2014 Document Reviewed: 08/13/2008 [employer] Interactive Patient Education [employer]

## 2015-12-20 ENCOUNTER — Emergency Department (HOSPITAL_COMMUNITY)
Admission: EM | Admit: 2015-12-20 | Discharge: 2015-12-20 | Disposition: A | Discharge status: Home / Self Care | Payer: BLUE CROSS/BLUE SHIELD | Attending: Emergency Medicine | Admitting: Emergency Medicine

## 2015-12-20 ENCOUNTER — Encounter (HOSPITAL_COMMUNITY): Payer: Self-pay | Admitting: *Deleted

## 2015-12-20 DIAGNOSIS — Z79899 Other long term (current) drug therapy: Secondary | ICD-10-CM | POA: Diagnosis not present

## 2015-12-20 DIAGNOSIS — Z7982 Long term (current) use of aspirin: Secondary | ICD-10-CM | POA: Insufficient documentation

## 2015-12-20 DIAGNOSIS — Y939 Activity, unspecified: Secondary | ICD-10-CM | POA: Insufficient documentation

## 2015-12-20 DIAGNOSIS — T148XXA Other injury of unspecified body region, initial encounter: Secondary | ICD-10-CM

## 2015-12-20 DIAGNOSIS — Z7984 Long term (current) use of oral hypoglycemic drugs: Secondary | ICD-10-CM | POA: Diagnosis not present

## 2015-12-20 DIAGNOSIS — E119 Type 2 diabetes mellitus without complications: Secondary | ICD-10-CM | POA: Diagnosis not present

## 2015-12-20 DIAGNOSIS — Y999 Unspecified external cause status: Secondary | ICD-10-CM | POA: Diagnosis not present

## 2015-12-20 DIAGNOSIS — Z794 Long term (current) use of insulin: Secondary | ICD-10-CM | POA: Insufficient documentation

## 2015-12-20 DIAGNOSIS — W228XXA Striking against or struck by other objects, initial encounter: Secondary | ICD-10-CM | POA: Insufficient documentation

## 2015-12-20 DIAGNOSIS — M79602 Pain in left arm: Secondary | ICD-10-CM | POA: Diagnosis present

## 2015-12-20 DIAGNOSIS — Y929 Unspecified place or not applicable: Secondary | ICD-10-CM | POA: Diagnosis not present

## 2015-12-20 DIAGNOSIS — I10 Essential (primary) hypertension: Secondary | ICD-10-CM | POA: Diagnosis not present

## 2015-12-20 MED ORDER — CYCLOBENZAPRINE HCL 10 MG PO TABS: 10.0000 mg | ORAL_TABLET | Freq: Three times a day (TID) | ORAL | 0 refills | Status: DC | PRN

## 2015-12-20 NOTE — ED Triage Notes (Addendum)
PT reports he still has LT arm pain after being seen 2 days ago  in the ED for same.
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
## 2015-12-20 NOTE — ED Notes (Signed)
Declined W/C at D/C and was escorted to lobby by RN. 

## 2015-12-23 ENCOUNTER — Ambulatory Visit (INDEPENDENT_AMBULATORY_CARE_PROVIDER_SITE_OTHER): Payer: BLUE CROSS/BLUE SHIELD | Admitting: Family Medicine

## 2015-12-23 ENCOUNTER — Encounter: Payer: Self-pay | Admitting: Family Medicine

## 2015-12-23 VITALS — BP 140/101 | HR 101 | Temp 97.8°F | Resp 16 | Wt 257.8 lb

## 2015-12-23 DIAGNOSIS — E349 Endocrine disorder, unspecified: Secondary | ICD-10-CM

## 2015-12-23 DIAGNOSIS — M79632 Pain in left forearm: Secondary | ICD-10-CM | POA: Diagnosis not present

## 2015-12-23 DIAGNOSIS — E118 Type 2 diabetes mellitus with unspecified complications: Secondary | ICD-10-CM | POA: Diagnosis not present

## 2015-12-23 DIAGNOSIS — E291 Testicular hypofunction: Secondary | ICD-10-CM

## 2015-12-23 DIAGNOSIS — I10 Essential (primary) hypertension: Secondary | ICD-10-CM | POA: Diagnosis not present

## 2015-12-23 LAB — PSA: PSA: 0.2 ng/mL (ref <=4.0)

## 2015-12-23 LAB — GLUCOSE, CAPILLARY: Glucose-Capillary: 262 mg/dL — ABNORMAL HIGH (ref 65–99)
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
## 2015-12-23 NOTE — Patient Instructions (Signed)
Increase your Lantus to 26 units daily. Try to remember it every day. Try to check you blood sugar at least twice per day, 3 times per day is ideal I will call you with the results of your PSA. Please follow up with me in 1 month with your blood sugars or sooner as needed.

## 2015-12-24 NOTE — Assessment & Plan Note (Signed)
Diagnosed by ortho. Noted to have low testosterone previously at one of our visits. Dicussed possibility of replacing testosterone however pt not agreeable to prostate exam at that time. Today's exam was difficult, however did not note any abnormality.  - will get PSA as baseline then consider replacement.

## 2015-12-24 NOTE — Assessment & Plan Note (Signed)
Most likely secondary to epicondylitis however history is not great for this.  - continue supportive management, advised to ice the area TID. - continue stretches - if symptoms persist could refer to sports medicine.

## 2015-12-24 NOTE — Assessment & Plan Note (Signed)
Hasn't taken his medication today. BP not at goal. - advised at next appt to take all meds prior to visit - if still elevated consider increasing regimen. - counseled on lifestyle changes, pt no eager to meet with nutrition at this time

## 2015-12-24 NOTE — Assessment & Plan Note (Signed)
Poorly controlled. Due to dietary indiscretion and poor adherence. Still doesn't check CBGs. Can tell me what hypoglycemic spells feel like. Could also tell me the potential consequences of poorly controlled DM. - increase Lantus to 26 units daily. - advised pt to check his CBGs at least twice daily, ideally 3 times a day. - pt to f/u with me in 1 month with CBG log.

## 2015-12-26 ENCOUNTER — Emergency Department (HOSPITAL_COMMUNITY)
Admission: EM | Admit: 2015-12-26 | Discharge: 2015-12-26 | Disposition: A | Discharge status: Home / Self Care | Payer: BLUE CROSS/BLUE SHIELD | Attending: Emergency Medicine | Admitting: Emergency Medicine

## 2015-12-26 ENCOUNTER — Encounter (HOSPITAL_COMMUNITY): Payer: Self-pay | Admitting: Emergency Medicine

## 2015-12-26 DIAGNOSIS — Z7982 Long term (current) use of aspirin: Secondary | ICD-10-CM | POA: Insufficient documentation

## 2015-12-26 DIAGNOSIS — M79602 Pain in left arm: Secondary | ICD-10-CM | POA: Diagnosis present

## 2015-12-26 DIAGNOSIS — Z794 Long term (current) use of insulin: Secondary | ICD-10-CM | POA: Diagnosis not present

## 2015-12-26 DIAGNOSIS — E119 Type 2 diabetes mellitus without complications: Secondary | ICD-10-CM | POA: Insufficient documentation

## 2015-12-26 DIAGNOSIS — I10 Essential (primary) hypertension: Secondary | ICD-10-CM | POA: Insufficient documentation

## 2015-12-26 DIAGNOSIS — Z7984 Long term (current) use of oral hypoglycemic drugs: Secondary | ICD-10-CM | POA: Diagnosis not present

## 2015-12-26 DIAGNOSIS — Z79899 Other long term (current) drug therapy: Secondary | ICD-10-CM | POA: Insufficient documentation

## 2015-12-26 MED ORDER — IBUPROFEN 800 MG PO TABS: 800.0000 mg | ORAL_TABLET | Freq: Three times a day (TID) | ORAL | 0 refills | Status: DC

## 2015-12-26 NOTE — Discharge Instructions (Signed)
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
## 2015-12-26 NOTE — ED Triage Notes (Signed)
Pt reports L posterior arm pain for the past month. Has been seen by PCP for this and was diagnosed with tennis elbow.

## 2015-12-30 ENCOUNTER — Ambulatory Visit: Payer: BLUE CROSS/BLUE SHIELD | Admitting: Podiatry

## 2016-01-04 ENCOUNTER — Other Ambulatory Visit: Payer: Self-pay | Admitting: Family Medicine

## 2016-01-04 ENCOUNTER — Emergency Department (HOSPITAL_COMMUNITY)
Admission: EM | Admit: 2016-01-04 | Discharge: 2016-01-04 | Disposition: A | Discharge status: Home / Self Care | Payer: BLUE CROSS/BLUE SHIELD | Attending: Emergency Medicine | Admitting: Emergency Medicine

## 2016-01-04 ENCOUNTER — Emergency Department (HOSPITAL_COMMUNITY): Payer: BLUE CROSS/BLUE SHIELD

## 2016-01-04 ENCOUNTER — Encounter (HOSPITAL_COMMUNITY): Payer: Self-pay | Admitting: Emergency Medicine

## 2016-01-04 DIAGNOSIS — Z7984 Long term (current) use of oral hypoglycemic drugs: Secondary | ICD-10-CM | POA: Insufficient documentation

## 2016-01-04 DIAGNOSIS — Y939 Activity, unspecified: Secondary | ICD-10-CM | POA: Diagnosis not present

## 2016-01-04 DIAGNOSIS — Z794 Long term (current) use of insulin: Secondary | ICD-10-CM | POA: Insufficient documentation

## 2016-01-04 DIAGNOSIS — I10 Essential (primary) hypertension: Secondary | ICD-10-CM | POA: Diagnosis not present

## 2016-01-04 DIAGNOSIS — W109XXA Fall (on) (from) unspecified stairs and steps, initial encounter: Secondary | ICD-10-CM | POA: Insufficient documentation

## 2016-01-04 DIAGNOSIS — M546 Pain in thoracic spine: Secondary | ICD-10-CM | POA: Insufficient documentation

## 2016-01-04 DIAGNOSIS — Y929 Unspecified place or not applicable: Secondary | ICD-10-CM | POA: Insufficient documentation

## 2016-01-04 DIAGNOSIS — E119 Type 2 diabetes mellitus without complications: Secondary | ICD-10-CM | POA: Diagnosis not present

## 2016-01-04 DIAGNOSIS — Y999 Unspecified external cause status: Secondary | ICD-10-CM | POA: Diagnosis not present

## 2016-01-04 DIAGNOSIS — Z79899 Other long term (current) drug therapy: Secondary | ICD-10-CM | POA: Insufficient documentation

## 2016-01-04 MED ORDER — METAXALONE 800 MG PO TABS: 800.0000 mg | ORAL_TABLET | Freq: Three times a day (TID) | ORAL | 0 refills | Status: DC | PRN

## 2016-01-04 NOTE — ED Triage Notes (Signed)
Pt states he tripped and feel down steps yesterday. C/o back pain. States he re injured back.

## 2016-01-04 NOTE — Telephone Encounter (Signed)
Patient would also like to know the results from his most recent testing for cancer?  Marcus [name],CMA
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
## 2016-01-05 ENCOUNTER — Ambulatory Visit (INDEPENDENT_AMBULATORY_CARE_PROVIDER_SITE_OTHER): Payer: BLUE CROSS/BLUE SHIELD | Admitting: Podiatry

## 2016-01-05 DIAGNOSIS — B351 Tinea unguium: Secondary | ICD-10-CM | POA: Diagnosis not present

## 2016-01-05 DIAGNOSIS — M79676 Pain in unspecified toe(s): Secondary | ICD-10-CM

## 2016-01-05 MED ORDER — TESTOSTERONE ENANTHATE 200 MG/ML IM SOLN: 100.0000 mg | INTRAMUSCULAR | 0 refills | Status: DC
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
## 2016-01-06 ENCOUNTER — Encounter (HOSPITAL_COMMUNITY): Payer: Self-pay

## 2016-01-06 ENCOUNTER — Emergency Department (HOSPITAL_COMMUNITY): Payer: BLUE CROSS/BLUE SHIELD

## 2016-01-06 ENCOUNTER — Emergency Department (HOSPITAL_COMMUNITY)
Admission: EM | Admit: 2016-01-06 | Discharge: 2016-01-06 | Disposition: A | Discharge status: Home / Self Care | Payer: BLUE CROSS/BLUE SHIELD | Attending: Emergency Medicine | Admitting: Emergency Medicine

## 2016-01-06 DIAGNOSIS — M545 Low back pain, unspecified: Secondary | ICD-10-CM

## 2016-01-06 DIAGNOSIS — Z794 Long term (current) use of insulin: Secondary | ICD-10-CM | POA: Diagnosis not present

## 2016-01-06 DIAGNOSIS — E1165 Type 2 diabetes mellitus with hyperglycemia: Secondary | ICD-10-CM | POA: Insufficient documentation

## 2016-01-06 DIAGNOSIS — I10 Essential (primary) hypertension: Secondary | ICD-10-CM | POA: Insufficient documentation

## 2016-01-06 DIAGNOSIS — Z79899 Other long term (current) drug therapy: Secondary | ICD-10-CM | POA: Insufficient documentation

## 2016-01-06 DIAGNOSIS — Z7984 Long term (current) use of oral hypoglycemic drugs: Secondary | ICD-10-CM | POA: Insufficient documentation

## 2016-01-06 DIAGNOSIS — Z7982 Long term (current) use of aspirin: Secondary | ICD-10-CM | POA: Insufficient documentation

## 2016-01-06 DIAGNOSIS — M546 Pain in thoracic spine: Secondary | ICD-10-CM | POA: Insufficient documentation

## 2016-01-06 DIAGNOSIS — R739 Hyperglycemia, unspecified: Secondary | ICD-10-CM

## 2016-01-06 LAB — COMPREHENSIVE METABOLIC PANEL
ALT: 15 U/L — ABNORMAL LOW (ref 17–63)
AST: 19 U/L (ref 15–41)
Albumin: 4.2 g/dL (ref 3.5–5.0)
Alkaline Phosphatase: 64 U/L (ref 38–126)
Anion gap: 11 (ref 5–15)
BUN: 16 mg/dL (ref 6–20)
CO2: 24 mmol/L (ref 22–32)
Calcium: 9.7 mg/dL (ref 8.9–10.3)
Chloride: 99 mmol/L — ABNORMAL LOW (ref 101–111)
Creatinine, Ser: 1 mg/dL (ref 0.61–1.24)
GFR calc Af Amer: >60 mL/min (ref >60)
GFR calc non Af Amer: >60 mL/min (ref >60)
Glucose, Bld: 330 mg/dL — ABNORMAL HIGH (ref 65–99)
Potassium: 4.4 mmol/L (ref 3.5–5.1)
Sodium: 134 mmol/L — ABNORMAL LOW (ref 135–145)
Total Bilirubin: 0.3 mg/dL (ref 0.3–1.2)
Total Protein: 8.3 g/dL — ABNORMAL HIGH (ref 6.5–8.1)

## 2016-01-06 LAB — CBG MONITORING, ED
Glucose-Capillary: 358 mg/dL — ABNORMAL HIGH (ref 65–99)
Glucose-Capillary: 251 mg/dL — ABNORMAL HIGH (ref 65–99)
Glucose-Capillary: 222 mg/dL — ABNORMAL HIGH (ref 65–99)

## 2016-01-06 LAB — CBC WITH DIFFERENTIAL/PLATELET
Basophils Absolute: 0 10*3/uL (ref 0.0–0.1)
Basophils Relative: 1 %
Eosinophils Absolute: 0.2 10*3/uL (ref 0.0–0.7)
Eosinophils Relative: 3 %
HCT: 38.8 % — ABNORMAL LOW (ref 39.0–52.0)
Hemoglobin: 12.9 g/dL — ABNORMAL LOW (ref 13.0–17.0)
Lymphocytes Relative: 31 %
Lymphs Abs: 1.9 10*3/uL (ref 0.7–4.0)
MCH: 28.7 pg (ref 26.0–34.0)
MCHC: 33.2 g/dL (ref 30.0–36.0)
MCV: 86.4 fL (ref 78.0–100.0)
Monocytes Absolute: 0.4 10*3/uL (ref 0.1–1.0)
Monocytes Relative: 6 %
Neutro Abs: 3.7 10*3/uL (ref 1.7–7.7)
Neutrophils Relative %: 59 %
Platelets: 302 10*3/uL (ref 150–400)
RBC: 4.49 MIL/uL (ref 4.22–5.81)
RDW: 12.3 % (ref 11.5–15.5)
WBC: 6.1 10*3/uL (ref 4.0–10.5)

## 2016-01-06 LAB — URINALYSIS, ROUTINE W REFLEX MICROSCOPIC
Bilirubin Urine: NEGATIVE
Glucose, UA: >1000 mg/dL — AB
Hgb urine dipstick: NEGATIVE
Ketones, ur: NEGATIVE mg/dL
Leukocytes, UA: NEGATIVE
Nitrite: NEGATIVE
Protein, ur: NEGATIVE mg/dL
Specific Gravity, Urine: 1.033 — ABNORMAL HIGH (ref 1.005–1.030)
pH: 5 (ref 5.0–8.0)

## 2016-01-06 LAB — URINE MICROSCOPIC-ADD ON

## 2016-01-06 MED ORDER — SODIUM CHLORIDE 0.9 % IV BOLUS (SEPSIS)
1000.0000 mL | Freq: Once | INTRAVENOUS | Status: AC
  Administered 2016-01-06: 1000 mL via INTRAVENOUS
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
## 2016-01-06 NOTE — ED Notes (Signed)
[facility] Expected date:  Expected time:  Means of arrival:  Comments: EMS Fall/already eval

## 2016-01-06 NOTE — ED Triage Notes (Signed)
Per EMS- Patient had a fall 2 days ago and today is having increased pain in the lower back. Patient was given Fentanyl 100 mcg IV prior to arrival to the ED. Patient now rates pain 1/10. CBG- 416 per EMS.

## 2016-01-06 NOTE — ED Notes (Signed)
Patient reports that he took prescribed metformin, but not prescribed Insulin this AM.

## 2016-01-06 NOTE — Discharge Instructions (Signed)
Please take your insulin as prescribed at home.   Take naprosyn for pain and your muscle relaxants as needed.  Return for worsening symptoms, including new numbness/weakness, loss of control of bowel or bladder, fever, or any other symptoms concerning to you.

## 2016-01-07 ENCOUNTER — Telehealth: Payer: Self-pay | Admitting: *Deleted

## 2016-01-07 NOTE — Telephone Encounter (Signed)
Prior Authorization received [facility] for testosterone enanthate 200 mg/ml.  PA form placed in provider box for completion. Clovis PuMartin, Arielys Wandersee L, RN

## 2016-01-10 NOTE — Telephone Encounter (Signed)
PA form for testosterone enanthate faxed to [facility] for review.  Review process could take up to 5 business days. Clovis PuMartin, Tamika L, RN

## 2016-01-10 NOTE — Telephone Encounter (Signed)
Completed PA and placed in Tamika's box.  [name] S. Dorsey, MD [facility] Resident  01/10/2016, 12:03 PM

## 2016-01-14 ENCOUNTER — Emergency Department (HOSPITAL_COMMUNITY)
Admission: EM | Admit: 2016-01-14 | Discharge: 2016-01-14 | Disposition: A | Discharge status: Home / Self Care | Payer: BLUE CROSS/BLUE SHIELD

## 2016-01-14 NOTE — Telephone Encounter (Signed)
Additional information faxed.  Clovis PuMartin, Daiton Cowles L, RN

## 2016-01-14 NOTE — Telephone Encounter (Signed)
Received fax from patient's insurance needing additional information for the testosterone Enanthate PA.  Form placed in provider box.  Clovis PuMartin, Tung Pustejovsky L, RN

## 2016-01-18 ENCOUNTER — Emergency Department (HOSPITAL_COMMUNITY)
Admission: EM | Admit: 2016-01-18 | Discharge: 2016-01-18 | Disposition: A | Discharge status: Home / Self Care | Payer: BLUE CROSS/BLUE SHIELD | Attending: Physician Assistant | Admitting: Physician Assistant

## 2016-01-18 ENCOUNTER — Encounter (HOSPITAL_COMMUNITY): Payer: Self-pay | Admitting: Emergency Medicine

## 2016-01-18 DIAGNOSIS — E119 Type 2 diabetes mellitus without complications: Secondary | ICD-10-CM | POA: Insufficient documentation

## 2016-01-18 DIAGNOSIS — I10 Essential (primary) hypertension: Secondary | ICD-10-CM | POA: Diagnosis not present

## 2016-01-18 DIAGNOSIS — Z79899 Other long term (current) drug therapy: Secondary | ICD-10-CM | POA: Diagnosis not present

## 2016-01-18 DIAGNOSIS — Z7984 Long term (current) use of oral hypoglycemic drugs: Secondary | ICD-10-CM | POA: Insufficient documentation

## 2016-01-18 DIAGNOSIS — Z7982 Long term (current) use of aspirin: Secondary | ICD-10-CM | POA: Insufficient documentation

## 2016-01-18 DIAGNOSIS — M545 Low back pain: Secondary | ICD-10-CM | POA: Diagnosis present

## 2016-01-18 DIAGNOSIS — M5440 Lumbago with sciatica, unspecified side: Secondary | ICD-10-CM | POA: Insufficient documentation

## 2016-01-18 DIAGNOSIS — Z7951 Long term (current) use of inhaled steroids: Secondary | ICD-10-CM | POA: Insufficient documentation

## 2016-01-18 DIAGNOSIS — M5442 Lumbago with sciatica, left side: Secondary | ICD-10-CM

## 2016-01-18 DIAGNOSIS — M5441 Lumbago with sciatica, right side: Secondary | ICD-10-CM

## 2016-01-18 MED ORDER — CYCLOBENZAPRINE HCL 10 MG PO TABS: 10.0000 mg | ORAL_TABLET | Freq: Two times a day (BID) | ORAL | 0 refills | Status: DC | PRN

## 2016-01-18 MED ORDER — IBUPROFEN 800 MG PO TABS: 800.0000 mg | ORAL_TABLET | Freq: Three times a day (TID) | ORAL | 0 refills | Status: DC

## 2016-01-18 MED ORDER — IBUPROFEN 800 MG PO TABS
800.0000 mg | ORAL_TABLET | Freq: Once | ORAL | Status: AC
  Administered 2016-01-18: 800 mg via ORAL
  Filled 2016-01-18: qty 1

## 2016-01-18 MED ORDER — CYCLOBENZAPRINE HCL 10 MG PO TABS
10.0000 mg | ORAL_TABLET | Freq: Once | ORAL | Status: AC
  Administered 2016-01-18: 10 mg via ORAL
  Filled 2016-01-18: qty 1
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
## 2016-01-18 NOTE — Discharge Instructions (Signed)
Please use the medications provided to help you with symptoms. Otherwise please follow-up with your primary care physician or the specialist given for your back pain.  Please return with any concerning symptoms.
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
## 2016-01-25 ENCOUNTER — Emergency Department (HOSPITAL_COMMUNITY)
Admission: EM | Admit: 2016-01-25 | Discharge: 2016-01-25 | Disposition: A | Discharge status: Home / Self Care | Payer: BLUE CROSS/BLUE SHIELD | Attending: Emergency Medicine | Admitting: Emergency Medicine

## 2016-01-25 ENCOUNTER — Encounter (HOSPITAL_COMMUNITY): Payer: Self-pay

## 2016-01-25 DIAGNOSIS — Z7982 Long term (current) use of aspirin: Secondary | ICD-10-CM | POA: Diagnosis not present

## 2016-01-25 DIAGNOSIS — Z7984 Long term (current) use of oral hypoglycemic drugs: Secondary | ICD-10-CM | POA: Diagnosis not present

## 2016-01-25 DIAGNOSIS — K029 Dental caries, unspecified: Secondary | ICD-10-CM | POA: Insufficient documentation

## 2016-01-25 DIAGNOSIS — E039 Hypothyroidism, unspecified: Secondary | ICD-10-CM | POA: Insufficient documentation

## 2016-01-25 DIAGNOSIS — E119 Type 2 diabetes mellitus without complications: Secondary | ICD-10-CM | POA: Diagnosis not present

## 2016-01-25 DIAGNOSIS — Z79899 Other long term (current) drug therapy: Secondary | ICD-10-CM | POA: Diagnosis not present

## 2016-01-25 DIAGNOSIS — K0889 Other specified disorders of teeth and supporting structures: Secondary | ICD-10-CM | POA: Diagnosis present

## 2016-01-25 DIAGNOSIS — I10 Essential (primary) hypertension: Secondary | ICD-10-CM | POA: Insufficient documentation

## 2016-01-25 MED ORDER — LIDOCAINE VISCOUS 2 % MT SOLN
15.0000 mL | Freq: Once | OROMUCOSAL | Status: AC
  Administered 2016-01-25: 15 mL via OROMUCOSAL
  Filled 2016-01-25: qty 15

## 2016-01-25 MED ORDER — PENICILLIN V POTASSIUM 500 MG PO TABS: 500.0000 mg | ORAL_TABLET | Freq: Four times a day (QID) | ORAL | 0 refills | Status: AC

## 2016-01-25 MED ORDER — PENICILLIN V POTASSIUM 500 MG PO TABS
500.0000 mg | ORAL_TABLET | Freq: Once | ORAL | Status: AC
  Administered 2016-01-25: 500 mg via ORAL
  Filled 2016-01-25: qty 1

## 2016-01-25 MED ORDER — DICLOFENAC SODIUM ER 100 MG PO TB24: 100.0000 mg | ORAL_TABLET | Freq: Every day | ORAL | 0 refills | Status: DC

## 2016-01-25 NOTE — ED Triage Notes (Signed)
Pt complains of dental pain on the right lower side, his teeth are cracked and he states that he's been eating chips and apples and his teeth are weak to begin with

## 2016-01-25 NOTE — ED Notes (Signed)
Pt ambulatory and independent at discharge.  Verbalized understanding of discharge instructions 
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
## 2016-01-26 NOTE — Telephone Encounter (Signed)
Pa approved for testosterone enanthate vial valid 12/11/15-9-11/18 via [employer].  Clovis PuMartin, Tamika L, RN

## 2016-01-29 ENCOUNTER — Encounter (HOSPITAL_COMMUNITY): Payer: Self-pay

## 2016-01-29 ENCOUNTER — Emergency Department (HOSPITAL_COMMUNITY)
Admission: EM | Admit: 2016-01-29 | Discharge: 2016-01-29 | Disposition: A | Discharge status: Home / Self Care | Payer: BLUE CROSS/BLUE SHIELD | Attending: Emergency Medicine | Admitting: Emergency Medicine

## 2016-01-29 DIAGNOSIS — M545 Low back pain: Secondary | ICD-10-CM | POA: Diagnosis present

## 2016-01-29 DIAGNOSIS — E119 Type 2 diabetes mellitus without complications: Secondary | ICD-10-CM | POA: Insufficient documentation

## 2016-01-29 DIAGNOSIS — I10 Essential (primary) hypertension: Secondary | ICD-10-CM | POA: Diagnosis not present

## 2016-01-29 DIAGNOSIS — G8929 Other chronic pain: Secondary | ICD-10-CM | POA: Diagnosis not present

## 2016-01-29 DIAGNOSIS — Z7982 Long term (current) use of aspirin: Secondary | ICD-10-CM | POA: Diagnosis not present

## 2016-01-29 DIAGNOSIS — Z7984 Long term (current) use of oral hypoglycemic drugs: Secondary | ICD-10-CM | POA: Diagnosis not present

## 2016-01-29 DIAGNOSIS — Z79899 Other long term (current) drug therapy: Secondary | ICD-10-CM | POA: Insufficient documentation

## 2016-01-29 DIAGNOSIS — Z794 Long term (current) use of insulin: Secondary | ICD-10-CM | POA: Diagnosis not present

## 2016-01-29 MED ORDER — CYCLOBENZAPRINE HCL 10 MG PO TABS: 10.0000 mg | ORAL_TABLET | Freq: Three times a day (TID) | ORAL | 0 refills | Status: DC | PRN

## 2016-01-29 NOTE — Discharge Instructions (Signed)
Read the information below.  Use the prescribed medication as directed.  Please discuss all new medications with your pharmacist.  You may return to the Emergency Department at any time for worsening condition or any new symptoms that concern you.    If you develop fevers, loss of control of bowel or bladder, weakness or numbness in your legs, or are unable to walk, return to the ER for a recheck.  °
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
## 2016-01-29 NOTE — ED Triage Notes (Signed)
Patient here with ongoing lower back pain, seen in ED for same. denies new trauma. Pain increased with any change in position. Did not follow up with ortho as instructed

## 2016-01-29 NOTE — ED Notes (Signed)
Pt was given ice pack upon being brought to [facility] by triage tech.

## 2016-02-08 ENCOUNTER — Encounter (HOSPITAL_COMMUNITY): Payer: Self-pay | Admitting: Neurology

## 2016-02-08 ENCOUNTER — Emergency Department (HOSPITAL_COMMUNITY)
Admission: EM | Admit: 2016-02-08 | Discharge: 2016-02-08 | Disposition: A | Discharge status: Home / Self Care | Payer: BLUE CROSS/BLUE SHIELD | Attending: Emergency Medicine | Admitting: Emergency Medicine

## 2016-02-08 DIAGNOSIS — S199XXA Unspecified injury of neck, initial encounter: Secondary | ICD-10-CM | POA: Diagnosis present

## 2016-02-08 DIAGNOSIS — E119 Type 2 diabetes mellitus without complications: Secondary | ICD-10-CM | POA: Insufficient documentation

## 2016-02-08 DIAGNOSIS — Z794 Long term (current) use of insulin: Secondary | ICD-10-CM | POA: Diagnosis not present

## 2016-02-08 DIAGNOSIS — Z7984 Long term (current) use of oral hypoglycemic drugs: Secondary | ICD-10-CM | POA: Diagnosis not present

## 2016-02-08 DIAGNOSIS — S161XXA Strain of muscle, fascia and tendon at neck level, initial encounter: Secondary | ICD-10-CM | POA: Insufficient documentation

## 2016-02-08 DIAGNOSIS — Z7982 Long term (current) use of aspirin: Secondary | ICD-10-CM | POA: Diagnosis not present

## 2016-02-08 DIAGNOSIS — Y929 Unspecified place or not applicable: Secondary | ICD-10-CM | POA: Diagnosis not present

## 2016-02-08 DIAGNOSIS — Y939 Activity, unspecified: Secondary | ICD-10-CM | POA: Diagnosis not present

## 2016-02-08 DIAGNOSIS — X500XXA Overexertion from strenuous movement or load, initial encounter: Secondary | ICD-10-CM | POA: Insufficient documentation

## 2016-02-08 DIAGNOSIS — I10 Essential (primary) hypertension: Secondary | ICD-10-CM | POA: Insufficient documentation

## 2016-02-08 DIAGNOSIS — Y999 Unspecified external cause status: Secondary | ICD-10-CM | POA: Insufficient documentation

## 2016-02-08 LAB — CBG MONITORING, ED: Glucose-Capillary: 284 mg/dL — ABNORMAL HIGH (ref 65–99)

## 2016-02-08 MED ORDER — CYCLOBENZAPRINE HCL 10 MG PO TABS: 10.0000 mg | ORAL_TABLET | Freq: Two times a day (BID) | ORAL | 0 refills | Status: DC | PRN

## 2016-02-08 MED ORDER — NAPROXEN 500 MG PO TABS: 500.0000 mg | ORAL_TABLET | Freq: Two times a day (BID) | ORAL | 0 refills | Status: DC

## 2016-02-08 NOTE — ED Notes (Signed)
Pt helped move furniture 2 days ago. C/o right neck pain. No extremity paresthesias.

## 2016-02-08 NOTE — Discharge Instructions (Signed)
1. Continue home medications. 2. Start taking Flexeril twice daily as needed for muscle stiffness/strain as well as naproxen twice daily. 3.  Follow up with your PCP in 1 week for re-evaluation.  If you were given follow up with a specialist, please follow up with them accordingly.
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
## 2016-02-08 NOTE — ED Triage Notes (Signed)
Pt reports neck pain since Sunday after lifting heavy boxes. Pain remains, has done nothing to try and make it better. Pt is a x 4. In NAD.

## 2016-02-10 ENCOUNTER — Emergency Department (HOSPITAL_COMMUNITY)
Admission: EM | Admit: 2016-02-10 | Discharge: 2016-02-10 | Disposition: A | Discharge status: Home / Self Care | Payer: BLUE CROSS/BLUE SHIELD | Attending: Emergency Medicine | Admitting: Emergency Medicine

## 2016-02-10 ENCOUNTER — Emergency Department (HOSPITAL_COMMUNITY): Payer: BLUE CROSS/BLUE SHIELD

## 2016-02-10 DIAGNOSIS — Y9301 Activity, walking, marching and hiking: Secondary | ICD-10-CM | POA: Diagnosis not present

## 2016-02-10 DIAGNOSIS — Z794 Long term (current) use of insulin: Secondary | ICD-10-CM | POA: Diagnosis not present

## 2016-02-10 DIAGNOSIS — Y999 Unspecified external cause status: Secondary | ICD-10-CM | POA: Insufficient documentation

## 2016-02-10 DIAGNOSIS — W19XXXA Unspecified fall, initial encounter: Secondary | ICD-10-CM | POA: Diagnosis not present

## 2016-02-10 DIAGNOSIS — S93402A Sprain of unspecified ligament of left ankle, initial encounter: Secondary | ICD-10-CM | POA: Diagnosis not present

## 2016-02-10 DIAGNOSIS — E119 Type 2 diabetes mellitus without complications: Secondary | ICD-10-CM | POA: Insufficient documentation

## 2016-02-10 DIAGNOSIS — Z7984 Long term (current) use of oral hypoglycemic drugs: Secondary | ICD-10-CM | POA: Diagnosis not present

## 2016-02-10 DIAGNOSIS — I10 Essential (primary) hypertension: Secondary | ICD-10-CM | POA: Insufficient documentation

## 2016-02-10 DIAGNOSIS — Z79899 Other long term (current) drug therapy: Secondary | ICD-10-CM | POA: Diagnosis not present

## 2016-02-10 DIAGNOSIS — Z7982 Long term (current) use of aspirin: Secondary | ICD-10-CM | POA: Diagnosis not present

## 2016-02-10 DIAGNOSIS — Y92480 Sidewalk as the place of occurrence of the external cause: Secondary | ICD-10-CM | POA: Diagnosis not present

## 2016-02-10 DIAGNOSIS — S99912A Unspecified injury of left ankle, initial encounter: Secondary | ICD-10-CM | POA: Diagnosis present
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
## 2016-02-10 NOTE — ED Notes (Signed)
Bed: [id number] Expected date:  Expected time:  Means of arrival:  Comments: 40 yo M left ankle pain
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
## 2016-02-10 NOTE — ED Notes (Signed)
Tech applied lace up ankle brace (XL) per providers verbal order.

## 2016-02-12 ENCOUNTER — Emergency Department (HOSPITAL_COMMUNITY)
Admission: EM | Admit: 2016-02-12 | Discharge: 2016-02-12 | Disposition: A | Discharge status: Home / Self Care | Payer: BLUE CROSS/BLUE SHIELD | Attending: Emergency Medicine | Admitting: Emergency Medicine

## 2016-02-12 ENCOUNTER — Encounter (HOSPITAL_COMMUNITY): Payer: Self-pay

## 2016-02-12 DIAGNOSIS — Z7982 Long term (current) use of aspirin: Secondary | ICD-10-CM | POA: Insufficient documentation

## 2016-02-12 DIAGNOSIS — Z79899 Other long term (current) drug therapy: Secondary | ICD-10-CM | POA: Insufficient documentation

## 2016-02-12 DIAGNOSIS — I10 Essential (primary) hypertension: Secondary | ICD-10-CM | POA: Insufficient documentation

## 2016-02-12 DIAGNOSIS — Z7984 Long term (current) use of oral hypoglycemic drugs: Secondary | ICD-10-CM | POA: Insufficient documentation

## 2016-02-12 DIAGNOSIS — E119 Type 2 diabetes mellitus without complications: Secondary | ICD-10-CM | POA: Diagnosis not present

## 2016-02-12 DIAGNOSIS — M25572 Pain in left ankle and joints of left foot: Secondary | ICD-10-CM | POA: Diagnosis present

## 2016-02-12 DIAGNOSIS — Z794 Long term (current) use of insulin: Secondary | ICD-10-CM | POA: Diagnosis not present

## 2016-02-12 NOTE — Discharge Instructions (Signed)
Return to the ED with any concerns including increased pain, numbness/discoloration/swelling of foot or toes, or any other alarming symptoms  You should be sure to use the ankle brace and crutches provided at last visit, keep foot elevated as much as possible.  Be sure to keep the appoitnment you have made with [facility] on October 16th

## 2016-02-12 NOTE — ED Triage Notes (Signed)
He states he was here a couple of days ago for left ankle sprain. He states "I came back because it swole up and still hurts" [sic].
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
## 2016-02-16 ENCOUNTER — Encounter (HOSPITAL_COMMUNITY): Payer: Self-pay | Admitting: Emergency Medicine

## 2016-02-16 ENCOUNTER — Emergency Department (HOSPITAL_COMMUNITY)
Admission: EM | Admit: 2016-02-16 | Discharge: 2016-02-16 | Disposition: A | Discharge status: Home / Self Care | Payer: BLUE CROSS/BLUE SHIELD | Attending: Emergency Medicine | Admitting: Emergency Medicine

## 2016-02-16 DIAGNOSIS — M25572 Pain in left ankle and joints of left foot: Secondary | ICD-10-CM | POA: Diagnosis present

## 2016-02-16 DIAGNOSIS — I10 Essential (primary) hypertension: Secondary | ICD-10-CM | POA: Diagnosis not present

## 2016-02-16 DIAGNOSIS — E119 Type 2 diabetes mellitus without complications: Secondary | ICD-10-CM | POA: Insufficient documentation

## 2016-02-16 DIAGNOSIS — Z794 Long term (current) use of insulin: Secondary | ICD-10-CM | POA: Diagnosis not present

## 2016-02-16 DIAGNOSIS — Z7982 Long term (current) use of aspirin: Secondary | ICD-10-CM | POA: Diagnosis not present

## 2016-02-16 DIAGNOSIS — Z79899 Other long term (current) drug therapy: Secondary | ICD-10-CM | POA: Diagnosis not present

## 2016-02-16 DIAGNOSIS — Z7984 Long term (current) use of oral hypoglycemic drugs: Secondary | ICD-10-CM | POA: Diagnosis not present

## 2016-02-16 NOTE — ED Triage Notes (Signed)
Pt not using ASO splint and crutches, states "rushed over here I guess." Ambulatory at a steady gait without assistance. States naproxen and flexeril RX effective.

## 2016-02-16 NOTE — ED Triage Notes (Signed)
Pt reports L ankle sprain, [facility]. States "got swole up last night" and reports no improvement in pain.

## 2016-02-16 NOTE — ED Notes (Signed)
PA at bedside.
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
## 2016-02-16 NOTE — ED Notes (Signed)
Patient left at this time with all belongings. 

## 2016-02-16 NOTE — Discharge Instructions (Signed)
Continue taking your diclofenac. Recommend to ice and elevate your ankle at home to help with swelling.' Follow-up with orthopedics as scheduled on the 15th. Return here for new concerns.

## 2016-02-17 ENCOUNTER — Encounter: Payer: Self-pay | Admitting: Family Medicine

## 2016-02-17 ENCOUNTER — Ambulatory Visit (INDEPENDENT_AMBULATORY_CARE_PROVIDER_SITE_OTHER): Payer: BLUE CROSS/BLUE SHIELD | Admitting: Family Medicine

## 2016-02-17 DIAGNOSIS — M25572 Pain in left ankle and joints of left foot: Secondary | ICD-10-CM

## 2016-02-17 MED ORDER — CYCLOBENZAPRINE HCL 10 MG PO TABS: 10.0000 mg | ORAL_TABLET | Freq: Two times a day (BID) | ORAL | 0 refills | Status: DC | PRN

## 2016-02-17 MED ORDER — ASPIRIN EC 81 MG PO TBEC: 81.0000 mg | DELAYED_RELEASE_TABLET | Freq: Every day | ORAL | 2 refills | Status: DC

## 2016-02-17 MED ORDER — DICLOFENAC SODIUM ER 100 MG PO TB24
100.0000 mg | ORAL_TABLET | Freq: Every day | ORAL | 0 refills | Status: DC
Start: 2016-02-17 — End: 2016-04-20

## 2016-02-17 NOTE — Patient Instructions (Addendum)
USE YOUR BRACE AND CRUTCHES ALL THE TIME!!  I have refilled your flexeril, aspirin, and diclofenac  Make sure you keep your appointment with the orthopedist and the neurologist. Make sure you take your blood pressure medications daily!   Ankle Sprain An ankle sprain is an injury to the strong, fibrous tissues (ligaments) that hold your ankle bones together.  HOME CARE   Put ice on your ankle for 1-2 days or as told by your doctor.  Put ice in a plastic bag.  Place a towel between your skin and the bag.  Leave the ice on for 15-20 minutes at a time, every 2 hours while you are awake.  Only take medicine as told by your doctor.  Raise (elevate) your injured ankle above the level of your heart as much as possible for 2-3 days.  Use crutches if your doctor tells you to. Slowly put your own weight on the affected ankle. Use the crutches until you can walk without pain.  If you have a plaster splint:  Do not rest it on anything harder than a pillow for 24 hours.  Do not put weight on it.  Do not get it wet.  Take it off to shower or bathe.  If given, use an elastic wrap or support stocking for support. Take the wrap off if your toes lose feeling (numb), tingle, or turn cold or blue.  If you have an air splint:  Add or let out air to make it comfortable.  Take it off at night and to shower and bathe.  Wiggle your toes and move your ankle up and down often while you are wearing it. GET HELP IF:  You have rapidly increasing bruising or puffiness (swelling).  Your toes feel very cold.  You lose feeling in your foot.  Your medicine does not help your pain. GET HELP RIGHT AWAY IF:   Your toes lose feeling (numb) or turn blue.  You have severe pain that is increasing. MAKE SURE YOU:   Understand these instructions.  Will watch your condition.  Will get help right away if you are not doing well or get worse.   This information is not intended to replace advice  given to you by your health care provider. Make sure you discuss any questions you have with your health care provider.   Document Released: 10/18/2007 Document Revised: 05/22/2014 Document Reviewed: 11/13/2011 Elsevier Interactive Patient Education [employer]

## 2016-02-17 NOTE — Assessment & Plan Note (Signed)
Patient presenting with L ankle sprain. Imaging done in ED without acute fracture. No red flags on exam. Discussed attempting to avoid ED for non-emergent reasons- discussed it was perfectly fine for emergencies however. Noted that we have same day slots open that he can try to get into, they just would not be with me. He asked me for an MRI today as he was wiggling his ankle around, I do not feel this is warranted at this time.  - refilled diclofenac and flexeril (flexeril for back pain as well) - stressed f/u with ortho for all his multiple issues  - I am concerned about the frequency of times he's been to the ED, will continue to monitor this. - f/u in 1 month for pain and BP (elevated here today however has not taken his medication).
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
## 2016-02-23 ENCOUNTER — Other Ambulatory Visit: Payer: Self-pay | Admitting: Family Medicine

## 2016-02-28 ENCOUNTER — Ambulatory Visit (INDEPENDENT_AMBULATORY_CARE_PROVIDER_SITE_OTHER): Payer: BLUE CROSS/BLUE SHIELD | Admitting: Orthopaedic Surgery

## 2016-03-02 ENCOUNTER — Other Ambulatory Visit: Payer: Self-pay | Admitting: Family Medicine

## 2016-03-03 ENCOUNTER — Other Ambulatory Visit: Payer: Self-pay | Admitting: Neurological Surgery

## 2016-03-03 DIAGNOSIS — M5416 Radiculopathy, lumbar region: Secondary | ICD-10-CM

## 2016-03-03 DIAGNOSIS — M5412 Radiculopathy, cervical region: Secondary | ICD-10-CM

## 2016-03-05 ENCOUNTER — Ambulatory Visit
Admission: RE | Admit: 2016-03-05 | Discharge: 2016-03-05 | Disposition: A | Discharge status: Home / Self Care | Payer: BLUE CROSS/BLUE SHIELD | Source: Ambulatory Visit | Attending: Neurological Surgery | Admitting: Neurological Surgery

## 2016-03-05 DIAGNOSIS — M5416 Radiculopathy, lumbar region: Secondary | ICD-10-CM

## 2016-03-05 DIAGNOSIS — M5412 Radiculopathy, cervical region: Secondary | ICD-10-CM

## 2016-03-06 ENCOUNTER — Other Ambulatory Visit: Payer: Self-pay | Admitting: Family Medicine

## 2016-03-06 MED ORDER — TESTOSTERONE CYPIONATE 200 MG/ML IM SOLN: 100.0000 mg | INTRAMUSCULAR | 5 refills | Status: DC

## 2016-03-07 ENCOUNTER — Encounter: Payer: Self-pay | Admitting: Student

## 2016-03-07 ENCOUNTER — Ambulatory Visit (INDEPENDENT_AMBULATORY_CARE_PROVIDER_SITE_OTHER): Payer: BLUE CROSS/BLUE SHIELD | Admitting: Student

## 2016-03-07 ENCOUNTER — Encounter: Payer: Self-pay | Admitting: Family Medicine

## 2016-03-07 ENCOUNTER — Telehealth: Payer: Self-pay | Admitting: *Deleted

## 2016-03-07 ENCOUNTER — Other Ambulatory Visit: Payer: Self-pay | Admitting: Family Medicine

## 2016-03-07 VITALS — BP 182/145 | HR 118 | Temp 97.8°F | Wt 257.0 lb

## 2016-03-07 DIAGNOSIS — G8929 Other chronic pain: Secondary | ICD-10-CM | POA: Diagnosis not present

## 2016-03-07 DIAGNOSIS — M25572 Pain in left ankle and joints of left foot: Secondary | ICD-10-CM | POA: Diagnosis not present

## 2016-03-07 MED ORDER — CYCLOBENZAPRINE HCL 10 MG PO TABS: 10.0000 mg | ORAL_TABLET | Freq: Two times a day (BID) | ORAL | 0 refills | Status: DC | PRN

## 2016-03-07 MED ORDER — LISINOPRIL 40 MG PO TABS: 40.0000 mg | ORAL_TABLET | Freq: Every day | ORAL | 0 refills | Status: DC

## 2016-03-07 NOTE — Patient Instructions (Signed)
Follow up as needed with PCP Please WEAR foot brace daily You may take Naproxen or flexeril for pain If you have any questions or concerns,c all the office at [phone]

## 2016-03-07 NOTE — Telephone Encounter (Signed)
Prior Authorization received from [facility] for testosterone cypionate 200 mg/ml. PA completed online at www.covermymeds.com.  PA approved valid 02/06/16-03/07/17.  Case ID: [id number].  Clovis PuMartin, Jacson Rapaport L, RN

## 2016-03-07 NOTE — Progress Notes (Signed)
   Subjective:    Patient ID: Marcus BassetErick T [name], male    DOB: 05-Jan-1976, 40 y.o.   MRN: [id number]   CC: continued left ankle pain  HPI: 40 y/o M with left ankle pain  Left ankle pain - twisted his ankle on 9/28 and was evaluated in the ED at that time and had negative Xrays - he was given an ASO brace but while he reports wearing it, he is not wearing it today and was not at his last visit for ankle pain on 10/5 - he has been seen on 9/28, 30, 10/4 and 10/5 for this - his pain has not worsened in character - denies any further trauma - he denies difficulty walking, just notes pain with ambulation - he has occasionally been taking naproxen, has been taking flexeril - otherwise denies any fevers, recent illness, chest pain or SOB  Smoking status reviewed  Review of Systems  Per HPI, else denies  abdominal pain, N/V/D, weakness    Objective:  BP (!) 182/145   Pulse (!) 118   Temp 97.8 F (36.6 C) (Oral)   Wt 257 lb (116.6 kg)   BMI 40.25 kg/m  Vitals and nursing note reviewed  General: NAD Cardiac: RRR,  Respiratory: CTAB, normal effort Extremities: left ankle mild tenderness to the lateral malleolus, no swelling, erythema or deformities, + pedal pulses Skin: warm and dry, no rashes noted Neuro: alert and oriented, normal gait   Assessment & Plan:    Left ankle pain Continued left ankle pain due to sprain in the setting of non compliance with ankle brace and only occasional pain medication use. No evidence of neurovascular compromise.  - will continue Naproxen PRN pain, and urge compliance with brace - flexeril refilled - will follow with PCP as needed    Rhea Thrun A. Kennon RoundsHaney MD, MS Family Medicine Resident PGY-3 Pager [phone]

## 2016-03-07 NOTE — Assessment & Plan Note (Addendum)
Continued left ankle pain due to sprain in the setting of non compliance with ankle brace and only occasional pain medication use. No evidence of neurovascular compromise.  - will continue Naproxen PRN pain, and urge compliance with brace - flexeril refilled - will follow with PCP as needed

## 2016-03-08 ENCOUNTER — Other Ambulatory Visit: Payer: Self-pay | Admitting: Family Medicine

## 2016-03-08 MED ORDER — AMLODIPINE BESYLATE 10 MG PO TABS: 10.0000 mg | ORAL_TABLET | Freq: Every day | ORAL | 2 refills | Status: DC

## 2016-03-13 ENCOUNTER — Encounter: Payer: Self-pay | Admitting: Family Medicine

## 2016-03-13 ENCOUNTER — Telehealth: Payer: Self-pay | Admitting: Family Medicine

## 2016-03-13 NOTE — Telephone Encounter (Signed)
I have attempted to call the patient to see if he can come [facility] (described as an integrative appointment with myself and other providers) on 11/2 at 3 or 4pm.  I also sent him a patient message but I'm not sure if he'll check that message.  Could you please try to contact him to see if he can attend one of these appointment times on Thursday?  Thanks, [employer] Wateen Varon

## 2016-03-14 ENCOUNTER — Telehealth: Payer: Self-pay | Admitting: Family Medicine

## 2016-03-14 NOTE — Telephone Encounter (Signed)
I called and had the patient schedule an appt in my [facility] clinic on 11/2. I noted he was on my schedule for 11/1 as well. Will you please contact him and let him know he doesn't need the appt on 11/1 unless he really needs to be seen tomorrow instead of Thursday.  Thanks, [name] S. Dorsey, MD [facility] Resident  03/14/2016, 1:11 PM

## 2016-03-14 NOTE — Telephone Encounter (Signed)
Spoke with patient and informed him that it wasn't necessary to keep appointment Wednesday as MD could cover all his issues at his appointment on Thursday. Patient states he would still like to keep appointment Wednesday as he has an issue to speak with PCP about (patient refused to give any details). Again informed patient that this could be discussed Thursday but patient wishes to keep appointment Wednesday as well.

## 2016-03-15 ENCOUNTER — Encounter (HOSPITAL_COMMUNITY): Payer: Self-pay | Admitting: Emergency Medicine

## 2016-03-15 ENCOUNTER — Ambulatory Visit (INDEPENDENT_AMBULATORY_CARE_PROVIDER_SITE_OTHER): Payer: BLUE CROSS/BLUE SHIELD | Admitting: Family Medicine

## 2016-03-15 ENCOUNTER — Emergency Department (HOSPITAL_COMMUNITY)
Admission: EM | Admit: 2016-03-15 | Discharge: 2016-03-15 | Disposition: A | Discharge status: Home / Self Care | Payer: BLUE CROSS/BLUE SHIELD | Attending: Emergency Medicine | Admitting: Emergency Medicine

## 2016-03-15 DIAGNOSIS — Y999 Unspecified external cause status: Secondary | ICD-10-CM | POA: Diagnosis not present

## 2016-03-15 DIAGNOSIS — I10 Essential (primary) hypertension: Secondary | ICD-10-CM | POA: Diagnosis not present

## 2016-03-15 DIAGNOSIS — Y9389 Activity, other specified: Secondary | ICD-10-CM | POA: Diagnosis not present

## 2016-03-15 DIAGNOSIS — Y92009 Unspecified place in unspecified non-institutional (private) residence as the place of occurrence of the external cause: Secondary | ICD-10-CM | POA: Diagnosis not present

## 2016-03-15 DIAGNOSIS — Z79899 Other long term (current) drug therapy: Secondary | ICD-10-CM | POA: Diagnosis not present

## 2016-03-15 DIAGNOSIS — E119 Type 2 diabetes mellitus without complications: Secondary | ICD-10-CM | POA: Insufficient documentation

## 2016-03-15 DIAGNOSIS — Z7982 Long term (current) use of aspirin: Secondary | ICD-10-CM | POA: Insufficient documentation

## 2016-03-15 DIAGNOSIS — W010XXA Fall on same level from slipping, tripping and stumbling without subsequent striking against object, initial encounter: Secondary | ICD-10-CM | POA: Diagnosis not present

## 2016-03-15 DIAGNOSIS — S161XXA Strain of muscle, fascia and tendon at neck level, initial encounter: Secondary | ICD-10-CM | POA: Insufficient documentation

## 2016-03-15 DIAGNOSIS — S199XXA Unspecified injury of neck, initial encounter: Secondary | ICD-10-CM | POA: Diagnosis present

## 2016-03-15 MED ORDER — CYCLOBENZAPRINE HCL 10 MG PO TABS: 10.0000 mg | ORAL_TABLET | Freq: Three times a day (TID) | ORAL | 0 refills | Status: DC | PRN

## 2016-03-15 MED ORDER — KETOROLAC TROMETHAMINE 60 MG/2ML IM SOLN
60.0000 mg | Freq: Once | INTRAMUSCULAR | Status: AC
  Administered 2016-03-15: 60 mg via INTRAMUSCULAR
  Filled 2016-03-15: qty 2

## 2016-03-15 MED ORDER — NAPROXEN 500 MG PO TABS: 500.0000 mg | ORAL_TABLET | Freq: Two times a day (BID) | ORAL | 0 refills | Status: DC

## 2016-03-15 MED ORDER — CYCLOBENZAPRINE HCL 10 MG PO TABS
10.0000 mg | ORAL_TABLET | Freq: Once | ORAL | Status: AC
  Administered 2016-03-15: 10 mg via ORAL
  Filled 2016-03-15: qty 1

## 2016-03-15 NOTE — ED Triage Notes (Signed)
Per EMS pt fell off stool this evening. States "knees gave out", pt has hx of knee problems. Pt states no LOC, Nausea/Vomitting, pt not on blood thinners. Pt did hit head. AOx4.

## 2016-03-15 NOTE — ED Notes (Signed)
Bed: FA21WA13 Expected date:  Expected time:  Means of arrival:  Comments: EMS 40 yo male fell off a foot stool-neck pain

## 2016-03-15 NOTE — Assessment & Plan Note (Signed)
Seems to be diffuse but wanted to discuss his lumbar pain today. Mild spasticity noted in the lumbar region. No red flags on exam or history. Recent MRI ordered by ortho revealed lumbar spondylosis and degenerative disc disease, causing mild left foraminal stenosis at the L5-S1 level. There was more impingement in the cervical spine which is not the patient's complaint today and will be followed by ortho.  - discussed stretching  - PT for the lumbar spine (pt notes he was already referred by ortho). -PRN flexeril - return precautions discussed.

## 2016-03-15 NOTE — ED Notes (Signed)
Pt c/o R. Sided Neck Pain after falling off stool. Denies headache, Neuro assessment WDL.

## 2016-03-15 NOTE — Assessment & Plan Note (Signed)
BP not controlled. Pt states he hasn't taken his medication today. Advised him to take his medication at the same time each day. Pt to f/u with me tomorrow after taking medications.

## 2016-03-15 NOTE — Patient Instructions (Addendum)
It was nice to see you again.  The best thing for your lower back will PT.  Use heat on the area.  Start taking the Flexeril as needed.   We will recheck her blood pressure today. Please make sure you take all of your blood pressure medications before your appointment tomorrow.  Please bring all of your blood sugar logs so we can better go over your diabetes.
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
## 2016-03-16 ENCOUNTER — Encounter: Payer: Self-pay | Admitting: Family Medicine

## 2016-03-16 ENCOUNTER — Ambulatory Visit (INDEPENDENT_AMBULATORY_CARE_PROVIDER_SITE_OTHER): Payer: BLUE CROSS/BLUE SHIELD | Admitting: Family Medicine

## 2016-03-16 VITALS — BP 132/78 | HR 126 | Temp 98.3°F | Ht 67.0 in | Wt 254.6 lb

## 2016-03-16 DIAGNOSIS — I10 Essential (primary) hypertension: Secondary | ICD-10-CM

## 2016-03-16 DIAGNOSIS — Z7189 Other specified counseling: Secondary | ICD-10-CM | POA: Insufficient documentation

## 2016-03-16 DIAGNOSIS — K089 Disorder of teeth and supporting structures, unspecified: Secondary | ICD-10-CM

## 2016-03-16 DIAGNOSIS — E118 Type 2 diabetes mellitus with unspecified complications: Secondary | ICD-10-CM

## 2016-03-16 DIAGNOSIS — F29 Unspecified psychosis not due to a substance or known physiological condition: Secondary | ICD-10-CM | POA: Insufficient documentation

## 2016-03-16 DIAGNOSIS — F259 Schizoaffective disorder, unspecified: Secondary | ICD-10-CM | POA: Diagnosis not present

## 2016-03-16 LAB — POCT GLYCOSYLATED HEMOGLOBIN (HGB A1C): Hemoglobin A1C: 10.7

## 2016-03-16 LAB — GLUCOSE, POCT (MANUAL RESULT ENTRY): POC Glucose: 330 mg/dl — AB (ref 70–99)

## 2016-03-16 NOTE — Progress Notes (Signed)
Top c
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
## 2016-03-16 NOTE — Patient Instructions (Signed)
It was good to see you again.  The goals we set for today are to work on nutrition and exercise, in addition to taking your medications at the same time daily. You felt that a long-time goal would be to lose 30 pounds in one year. You also felt like you could decrease the number of fried foods to eat to 2-3 times per week. The goal was to exercise 3 days per week. I will send Dr. Gerilyn PilgrimSykes, the nutritionist, a message and have her call you about appointment.  I would continue to talk to Dr. Rowe Clackrttan about decreasing her oxycodone, as this should not be a long-term medication.  Please remember to do your Lantus 26 units at bedtime. Please bring your blood sugar log and/or your glucometer to next appointment. Please bring all of your medications to next appointment. Please touch base with me about the name of her new psychiatrist.  I would like to see you again in 4 weeks to see how you're doing with these goals that we have set up.  If you note swelling, pain, or foul discharge from your mouth, please set up an appointment to evaluate for an infection. At this point, I do not see any signs of an abscess formation or any indication for antibiotics.

## 2016-03-17 NOTE — Assessment & Plan Note (Addendum)
No evidence of infection or abscess.  No need for antibiotics currently.  Discussed return precautions. Advised pt to f/u with dentistry

## 2016-03-17 NOTE — Assessment & Plan Note (Addendum)
Discussed patient with Dr. Deirdre Priesthambliss to see if we could determine any barriers to improvement in his glycemic control. Currently, the only thing identified is the patient's stubbornness (like his father) and possible lack for conviction that he needs the current regimen. Patient able to verbalize repercussions of uncontrolled DM.  - continue Lantus 26 units qHS - continue metformin - stressed importance of checking CBGs regularly.  - could consider addition of another oral agent as he seems to do better with PO regimens per his report. - pt to see nutrition. Will decreased fried foods to 2-3x/week and exercise 3x/week. - pt to f/u in 4 weeks to see how he's doing with his goals.

## 2016-03-17 NOTE — Assessment & Plan Note (Signed)
BP at goal today. We discussed the importance of taking medication at the same time on a regular basis (I feel like this may be to blame for his elevated BPs in the past). - continue current regimen

## 2016-03-20 ENCOUNTER — Telehealth (INDEPENDENT_AMBULATORY_CARE_PROVIDER_SITE_OTHER): Payer: Self-pay | Admitting: Orthopaedic Surgery

## 2016-03-20 NOTE — Telephone Encounter (Signed)
Patient is requesting a work note that states no restrictions. Please advise.

## 2016-03-21 ENCOUNTER — Ambulatory Visit: Payer: Self-pay | Admitting: Family Medicine

## 2016-03-21 ENCOUNTER — Other Ambulatory Visit: Payer: Self-pay | Admitting: Family Medicine

## 2016-03-21 DIAGNOSIS — E1110 Type 2 diabetes mellitus with ketoacidosis without coma: Secondary | ICD-10-CM

## 2016-03-21 DIAGNOSIS — Z794 Long term (current) use of insulin: Secondary | ICD-10-CM

## 2016-03-21 NOTE — Telephone Encounter (Signed)
Have not seen in 4 months-needs to check with his primary care MD and Dr Melvyn Novasrtmann re his return to work

## 2016-03-21 NOTE — Telephone Encounter (Signed)
Please advise 

## 2016-03-22 ENCOUNTER — Emergency Department (HOSPITAL_COMMUNITY)
Admission: EM | Admit: 2016-03-22 | Discharge: 2016-03-22 | Disposition: A | Discharge status: Home / Self Care | Payer: BLUE CROSS/BLUE SHIELD | Attending: Emergency Medicine | Admitting: Emergency Medicine

## 2016-03-22 ENCOUNTER — Encounter (HOSPITAL_COMMUNITY): Payer: Self-pay

## 2016-03-22 DIAGNOSIS — I10 Essential (primary) hypertension: Secondary | ICD-10-CM | POA: Diagnosis not present

## 2016-03-22 DIAGNOSIS — Z794 Long term (current) use of insulin: Secondary | ICD-10-CM | POA: Insufficient documentation

## 2016-03-22 DIAGNOSIS — Z7982 Long term (current) use of aspirin: Secondary | ICD-10-CM | POA: Insufficient documentation

## 2016-03-22 DIAGNOSIS — E119 Type 2 diabetes mellitus without complications: Secondary | ICD-10-CM | POA: Insufficient documentation

## 2016-03-22 DIAGNOSIS — Z7984 Long term (current) use of oral hypoglycemic drugs: Secondary | ICD-10-CM | POA: Insufficient documentation

## 2016-03-22 DIAGNOSIS — Z79899 Other long term (current) drug therapy: Secondary | ICD-10-CM | POA: Diagnosis not present

## 2016-03-22 DIAGNOSIS — M546 Pain in thoracic spine: Secondary | ICD-10-CM | POA: Insufficient documentation

## 2016-03-22 MED ORDER — NAPROXEN 500 MG PO TABS: 500.0000 mg | ORAL_TABLET | Freq: Two times a day (BID) | ORAL | 0 refills | Status: DC

## 2016-03-22 MED ORDER — TIZANIDINE HCL 2 MG PO CAPS
2.0000 mg | ORAL_CAPSULE | Freq: Three times a day (TID) | ORAL | 0 refills | Status: DC
Start: 2016-03-22 — End: 2019-04-15

## 2016-03-22 NOTE — ED Notes (Signed)
Bed: WTR6 Expected date:  Expected time:  Means of arrival:  Comments: 

## 2016-03-22 NOTE — Telephone Encounter (Signed)
Spoke with pt. See note below from PW. Told pt if the doctor's below clear him for work he may not need a follow up with PW

## 2016-03-22 NOTE — ED Triage Notes (Signed)
Pt c/o generalized back pain x 1 month.  Pain score 9/10.  Pt has not taken anything for pain.  Pt reports that he had an injury from a fall.  Sts "I was seen by a specialist and they told me there was something wrong on the MRI."  Pt has not follow-up w/ specialist.
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
## 2016-03-24 ENCOUNTER — Other Ambulatory Visit: Payer: Self-pay | Admitting: Family Medicine

## 2016-03-24 MED ORDER — VITAMIN D 1000 UNITS PO TABS: 1000.0000 [IU] | ORAL_TABLET | Freq: Every day | ORAL | 1 refills | Status: DC

## 2016-04-03 ENCOUNTER — Encounter: Payer: Self-pay | Admitting: Family Medicine

## 2016-04-03 ENCOUNTER — Other Ambulatory Visit: Payer: Self-pay | Admitting: Family Medicine

## 2016-04-03 MED ORDER — ONETOUCH ULTRASOFT LANCETS MISC: 12 refills | Status: DC

## 2016-04-03 MED ORDER — GLUCOSE BLOOD VI STRP: ORAL_STRIP | 12 refills | Status: DC

## 2016-04-03 NOTE — Progress Notes (Signed)
Called the patient to inquire what his glucometer is called.  He states he has once touch.   Rx sent to pharmacy. Advised pt once again he can request refills for his medications from the pharmacy and did not need to contact me through patient email (as it is often quicker). Pt voiced understanding.   [name] S. Reena Borromeo, MD [facility] Resident  04/03/2016, 10:57 AM

## 2016-04-06 ENCOUNTER — Encounter: Payer: Self-pay | Admitting: Family Medicine

## 2016-04-10 ENCOUNTER — Ambulatory Visit (INDEPENDENT_AMBULATORY_CARE_PROVIDER_SITE_OTHER): Payer: BLUE CROSS/BLUE SHIELD | Admitting: Orthopaedic Surgery

## 2016-04-10 ENCOUNTER — Other Ambulatory Visit: Payer: Self-pay | Admitting: Family Medicine

## 2016-04-11 ENCOUNTER — Telehealth: Payer: Self-pay | Admitting: Family Medicine

## 2016-04-11 NOTE — Telephone Encounter (Signed)
Called pharmacy to have them refill his one touch delica lancets as I could not find this option in our prescribing system.   Rx for One touch delica lancets to check CBGs TID, dispense 100, refills 11.  [name] S. Kenric Ginger, MD [facility] Resident  04/11/2016, 12:07 PM

## 2016-04-17 ENCOUNTER — Encounter: Payer: Self-pay | Admitting: Family Medicine

## 2016-04-17 ENCOUNTER — Ambulatory Visit (INDEPENDENT_AMBULATORY_CARE_PROVIDER_SITE_OTHER): Payer: BLUE CROSS/BLUE SHIELD | Admitting: Family Medicine

## 2016-04-17 VITALS — BP 132/100 | HR 108 | Temp 97.7°F | Ht 67.0 in | Wt 255.0 lb

## 2016-04-17 DIAGNOSIS — E118 Type 2 diabetes mellitus with unspecified complications: Secondary | ICD-10-CM

## 2016-04-17 DIAGNOSIS — I10 Essential (primary) hypertension: Secondary | ICD-10-CM
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
## 2016-04-17 NOTE — Patient Instructions (Signed)
Take your blood pressure medications. If your blood pressure does not look much better, I will start adding medications.  Keep up the good work with your diet and walking!  Keep checking your blood sugars and taking your Lantus every day!  Follow up with me in 2 months.

## 2016-04-19 ENCOUNTER — Encounter: Payer: Self-pay | Admitting: Family Medicine

## 2016-04-19 MED ORDER — GABAPENTIN 300 MG PO CAPS: ORAL_CAPSULE | ORAL | 1 refills | Status: DC

## 2016-04-19 MED ORDER — ATORVASTATIN CALCIUM 40 MG PO TABS
40.0000 mg | ORAL_TABLET | Freq: Every day | ORAL | 0 refills | Status: DC
Start: 2016-04-19 — End: 2018-02-22

## 2016-04-19 NOTE — Assessment & Plan Note (Signed)
Blood pressure uncontrolled. The patient does not take his medication today. We discussed adding another medication as he is now maxed out on 2 antihypertensives. The patient declined at this time. I discussed importance of taking his medication regularly. I discussed that at his follow-up appointment in 2 months, if his blood pressure is not at goal, we may need to add another medication. Patient is agreement with this plan. Discussed lifestyle modifications as well.

## 2016-04-19 NOTE — Assessment & Plan Note (Signed)
Home CBGs seem to indicate an improvement in his hyperglycemia, however he is not due for another A1c until 2 months from now. Foot exam performed today. The patient declined any pneumococcal and annual influenza vaccine. -Continue with Lantus 26 units daily. -Patient to follow-up in 2 months for an A1c. -Encouraged to check his CBGs at least 3 times per day, but with this patient I be happy with daily.

## 2016-04-20 ENCOUNTER — Encounter: Payer: Self-pay | Admitting: Family Medicine

## 2016-04-20 ENCOUNTER — Other Ambulatory Visit: Payer: Self-pay | Admitting: Family Medicine

## 2016-04-20 MED ORDER — CYCLOBENZAPRINE HCL 10 MG PO TABS: 10.0000 mg | ORAL_TABLET | Freq: Three times a day (TID) | ORAL | 0 refills | Status: DC | PRN

## 2016-04-20 MED ORDER — DICLOFENAC SODIUM ER 100 MG PO TB24: 100.0000 mg | ORAL_TABLET | Freq: Every day | ORAL | 0 refills | Status: DC

## 2016-04-22 ENCOUNTER — Other Ambulatory Visit: Payer: Self-pay | Admitting: Family Medicine

## 2016-04-24 ENCOUNTER — Encounter (HOSPITAL_COMMUNITY): Payer: Self-pay | Admitting: Emergency Medicine

## 2016-04-24 ENCOUNTER — Emergency Department (HOSPITAL_COMMUNITY)
Admission: EM | Admit: 2016-04-24 | Discharge: 2016-04-25 | Disposition: A | Discharge status: Other Acute Inpatient Hospital | Payer: BLUE CROSS/BLUE SHIELD | Attending: Emergency Medicine | Admitting: Emergency Medicine

## 2016-04-24 DIAGNOSIS — F3164 Bipolar disorder, current episode mixed, severe, with psychotic features: Secondary | ICD-10-CM | POA: Diagnosis present

## 2016-04-24 DIAGNOSIS — Z79899 Other long term (current) drug therapy: Secondary | ICD-10-CM | POA: Diagnosis not present

## 2016-04-24 DIAGNOSIS — Z794 Long term (current) use of insulin: Secondary | ICD-10-CM | POA: Insufficient documentation

## 2016-04-24 DIAGNOSIS — Z7982 Long term (current) use of aspirin: Secondary | ICD-10-CM | POA: Insufficient documentation

## 2016-04-24 DIAGNOSIS — R440 Auditory hallucinations: Secondary | ICD-10-CM | POA: Diagnosis present

## 2016-04-24 DIAGNOSIS — Z046 Encounter for general psychiatric examination, requested by authority: Secondary | ICD-10-CM

## 2016-04-24 DIAGNOSIS — I10 Essential (primary) hypertension: Secondary | ICD-10-CM | POA: Insufficient documentation

## 2016-04-24 DIAGNOSIS — E119 Type 2 diabetes mellitus without complications: Secondary | ICD-10-CM | POA: Diagnosis not present

## 2016-04-24 DIAGNOSIS — F333 Major depressive disorder, recurrent, severe with psychotic symptoms: Secondary | ICD-10-CM

## 2016-04-24 LAB — CBC
HCT: 33.6 % — ABNORMAL LOW (ref 39.0–52.0)
Hemoglobin: 10.7 g/dL — ABNORMAL LOW (ref 13.0–17.0)
MCH: 27.7 pg (ref 26.0–34.0)
MCHC: 31.8 g/dL (ref 30.0–36.0)
MCV: 87 fL (ref 78.0–100.0)
Platelets: 321 10*3/uL (ref 150–400)
RBC: 3.86 MIL/uL — ABNORMAL LOW (ref 4.22–5.81)
RDW: 13.3 % (ref 11.5–15.5)
WBC: 7.5 10*3/uL (ref 4.0–10.5)

## 2016-04-24 LAB — RAPID URINE DRUG SCREEN, HOSP PERFORMED
Amphetamines: NOT DETECTED
Barbiturates: NOT DETECTED
Benzodiazepines: NOT DETECTED
Cocaine: NOT DETECTED
Opiates: POSITIVE — AB
Tetrahydrocannabinol: NOT DETECTED

## 2016-04-24 LAB — COMPREHENSIVE METABOLIC PANEL
ALT: 19 U/L (ref 17–63)
AST: 19 U/L (ref 15–41)
Albumin: 3.9 g/dL (ref 3.5–5.0)
Alkaline Phosphatase: 49 U/L (ref 38–126)
Anion gap: 9 (ref 5–15)
BUN: 13 mg/dL (ref 6–20)
CO2: 25 mmol/L (ref 22–32)
Calcium: 9 mg/dL (ref 8.9–10.3)
Chloride: 102 mmol/L (ref 101–111)
Creatinine, Ser: 0.82 mg/dL (ref 0.61–1.24)
GFR calc Af Amer: >60 mL/min (ref >60)
GFR calc non Af Amer: >60 mL/min (ref >60)
Glucose, Bld: 120 mg/dL — ABNORMAL HIGH (ref 65–99)
Potassium: 4.2 mmol/L (ref 3.5–5.1)
Sodium: 136 mmol/L (ref 135–145)
Total Bilirubin: 0.6 mg/dL (ref 0.3–1.2)
Total Protein: 7.6 g/dL (ref 6.5–8.1)

## 2016-04-24 LAB — ETHANOL: Alcohol, Ethyl (B): <5 mg/dL (ref <5)

## 2016-04-24 LAB — CBG MONITORING, ED
Glucose-Capillary: 118 mg/dL — ABNORMAL HIGH (ref 65–99)
Glucose-Capillary: 66 mg/dL (ref 65–99)
Glucose-Capillary: 80 mg/dL (ref 65–99)

## 2016-04-24 MED ORDER — VITAMIN D3 25 MCG (1000 UNIT) PO TABS
1000.0000 [IU] | ORAL_TABLET | Freq: Every day | ORAL | Status: DC
  Administered 2016-04-24 – 2016-04-25 (×2): 1000 [IU] via ORAL
  Filled 2016-04-24 (×2): qty 1

## 2016-04-24 MED ORDER — ASPIRIN EC 81 MG PO TBEC
81.0000 mg | DELAYED_RELEASE_TABLET | Freq: Every day | ORAL | Status: DC
  Administered 2016-04-24 – 2016-04-25 (×2): 81 mg via ORAL
  Filled 2016-04-24 (×2): qty 1

## 2016-04-24 MED ORDER — AMLODIPINE BESYLATE 5 MG PO TABS
10.0000 mg | ORAL_TABLET | Freq: Every day | ORAL | Status: DC
  Administered 2016-04-24 – 2016-04-25 (×2): 10 mg via ORAL
  Filled 2016-04-24 (×2): qty 2

## 2016-04-24 MED ORDER — INSULIN GLARGINE 100 UNIT/ML ~~LOC~~ SOLN
26.0000 [IU] | Freq: Every day | SUBCUTANEOUS | Status: DC
  Administered 2016-04-24: 26 [IU] via SUBCUTANEOUS
  Filled 2016-04-24 (×2): qty 0.26

## 2016-04-24 MED ORDER — GABAPENTIN 300 MG PO CAPS
300.0000 mg | ORAL_CAPSULE | Freq: Two times a day (BID) | ORAL | Status: DC
  Administered 2016-04-24 – 2016-04-25 (×3): 300 mg via ORAL
  Filled 2016-04-24 (×3): qty 1

## 2016-04-24 MED ORDER — ACETAMINOPHEN 325 MG PO TABS: 650.0000 mg | ORAL_TABLET | ORAL | Status: DC | PRN

## 2016-04-24 MED ORDER — ONDANSETRON HCL 4 MG PO TABS: 4.0000 mg | ORAL_TABLET | Freq: Three times a day (TID) | ORAL | Status: DC | PRN

## 2016-04-24 MED ORDER — TRAZODONE HCL 50 MG PO TABS
50.0000 mg | ORAL_TABLET | Freq: Every evening | ORAL | Status: DC | PRN
  Administered 2016-04-24: 50 mg via ORAL
  Filled 2016-04-24: qty 1

## 2016-04-24 MED ORDER — LISINOPRIL 40 MG PO TABS
40.0000 mg | ORAL_TABLET | Freq: Every day | ORAL | Status: DC
  Administered 2016-04-24 – 2016-04-25 (×2): 40 mg via ORAL
  Filled 2016-04-24 (×2): qty 1

## 2016-04-24 MED ORDER — ATORVASTATIN CALCIUM 40 MG PO TABS
40.0000 mg | ORAL_TABLET | Freq: Every day | ORAL | Status: DC
  Administered 2016-04-24 – 2016-04-25 (×2): 40 mg via ORAL
  Filled 2016-04-24 (×2): qty 1

## 2016-04-24 MED ORDER — METFORMIN HCL 500 MG PO TABS
1000.0000 mg | ORAL_TABLET | Freq: Two times a day (BID) | ORAL | Status: DC
  Administered 2016-04-24 – 2016-04-25 (×3): 1000 mg via ORAL
  Filled 2016-04-24 (×3): qty 2

## 2016-04-24 MED ORDER — IBUPROFEN 200 MG PO TABS: 600.0000 mg | ORAL_TABLET | Freq: Three times a day (TID) | ORAL | Status: DC | PRN

## 2016-04-24 MED ORDER — ALUM & MAG HYDROXIDE-SIMETH 200-200-20 MG/5ML PO SUSP: 30.0000 mL | ORAL | Status: DC | PRN

## 2016-04-24 MED ORDER — ZOLPIDEM TARTRATE 5 MG PO TABS
5.0000 mg | ORAL_TABLET | Freq: Every evening | ORAL | Status: DC | PRN
Start: 2016-04-24 — End: 2016-04-24

## 2016-04-24 MED ORDER — LORATADINE 10 MG PO TABS
10.0000 mg | ORAL_TABLET | Freq: Every day | ORAL | Status: DC
  Administered 2016-04-24 – 2016-04-25 (×2): 10 mg via ORAL
  Filled 2016-04-24 (×2): qty 1

## 2016-04-24 MED ORDER — PALIPERIDONE ER 3 MG PO TB24
3.0000 mg | ORAL_TABLET | Freq: Every day | ORAL | Status: DC
  Administered 2016-04-24 – 2016-04-25 (×2): 3 mg via ORAL
  Filled 2016-04-24 (×2): qty 1

## 2016-04-24 MED ORDER — NICOTINE 21 MG/24HR TD PT24
21.0000 mg | MEDICATED_PATCH | Freq: Every day | TRANSDERMAL | Status: DC
  Filled 2016-04-24: qty 1

## 2016-04-24 MED ORDER — FLUOXETINE HCL 20 MG PO TABS
20.0000 mg | ORAL_TABLET | Freq: Every day | ORAL | Status: DC
  Administered 2016-04-24 – 2016-04-25 (×2): 20 mg via ORAL
  Filled 2016-04-24 (×3): qty 1

## 2016-04-24 NOTE — ED Triage Notes (Signed)
PT presents under IVC by [employer] for evaluation of bipolar and paranoia per petitioner. Pt currently denies any SI/HI, pt states that he does hear voices daily but none that are demonstrating harm to self or others. Pt currently alert and oriented no acute distress, pt calm and cooperative.

## 2016-04-24 NOTE — ED Notes (Signed)
End of shift report given to Jamison OkaPam Sadler RN.

## 2016-04-24 NOTE — ED Notes (Addendum)
Charted departure condition on wrong patient.

## 2016-04-24 NOTE — ED Notes (Signed)
Bed: WTR5 Expected date:  Expected time:  Means of arrival:  Comments: 

## 2016-04-24 NOTE — Progress Notes (Signed)
Patient noted to have been seen in the ED 17 times within the last six months.   Patient presents to ED under IVC by spouse. Patient listed as having [employer] insurance.  Patient's pcp is [facility] Rodrigo Ranrystal [facility]. [facility] noted to be 10/05 with follow up noted on 11/03 and 12/06.   Per chert review, [facility]. Patient listed as having [employer] insurance.

## 2016-04-24 NOTE — ED Notes (Signed)
Introduced self to patient. Pt oriented to unit expectations.  Assessed pt for:  A) Anxiety &/or agitation: Pt appears to be depressed and anxious. He does not want to be here and denies SI/HI, AVH. He does not appear to be responding to internal stimuli.  S) Safety: Safety maintained with q-15-minute checks and hourly rounds by staff.  A) ADLs: Pt able to perform ADLs independently.   P) Pick-Up (room cleanliness): Pt's room clean and free of clutter.
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
## 2016-04-24 NOTE — Progress Notes (Addendum)
Pt. Is calm and cooperative.  Has been asleep since writer arrived this pm at 19:00.  Pt. Is now awake d/t BP CK., HS medication and CBG.  Pt.'s CBG was 66.  Pt. Had not eaten his dinner this evening.  Writer called MD in the ED and instructed to encourage the pt. To eat peanut butter and crackers and recheck.  Pt. Appears to be cooperating with the writer and is presently eating the peanut butter and crackers.  Will recheck in 30".  Pt. Presently denies AH, SI and HI.  NO complaints of pain or discomfort noted .  Pt. Remains safe on the unit.   CBG is now 80.

## 2016-04-24 NOTE — ED Notes (Signed)
[facility] [place] Sheriff Paschal said that they cannot come until tomorrow morning.

## 2016-04-24 NOTE — Progress Notes (Signed)
04/24/16 1358:  LRT introduced self to patient and offered activities.  Patient declined.  Caroll RancherMarjette Kase Shughart, LRT/CTRS

## 2016-04-24 NOTE — BH Assessment (Addendum)
[facility] Assessment Progress Note  Per Thedore MinsMojeed Akintayo, MD, this pt requires psychiatric hospitalization.  Pt presents under IVC initiated by pt's spouse, and upheld by Dr Jannifer FranklinAkintayo.  At 15:40 Morrie Sheld[facility]hley [facility].  Pt has been accepted to their facility by Dr [facility].  Nanine MeansJamison Lord, DNP concurs with this decision.  Pt's nurse, Diane, has been notified, and agrees to call report to [phone].  [facility]   Doylene Canninghomas Crit Obremski, MA Triage Specialist [phone]
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
## 2016-04-25 DIAGNOSIS — F3164 Bipolar disorder, current episode mixed, severe, with psychotic features: Secondary | ICD-10-CM | POA: Diagnosis not present

## 2016-04-25 LAB — CBG MONITORING, ED
Glucose-Capillary: 61 mg/dL — ABNORMAL LOW (ref 65–99)
Glucose-Capillary: 108 mg/dL — ABNORMAL HIGH (ref 65–99)

## 2016-04-25 NOTE — ED Notes (Signed)
Fasting CBG = 61. Pt given OJ and peanut butter crackers. Pt not symptomatic.

## 2016-04-25 NOTE — ED Notes (Signed)
[facility] [place] [facility]. Pt calm and cooperative. Report called to Ardelia MemsYolanda Miller yesterday and this writer tried to call an updated report this morning prior to transport, but staff rejected call because they were doing their medication administration. All belongings returned to pt who signed for same.

## 2016-05-03 ENCOUNTER — Ambulatory Visit: Payer: BLUE CROSS/BLUE SHIELD | Admitting: Podiatry

## 2016-05-03 ENCOUNTER — Ambulatory Visit: Payer: BLUE CROSS/BLUE SHIELD | Admitting: Internal Medicine

## 2016-05-03 ENCOUNTER — Other Ambulatory Visit: Payer: Self-pay | Admitting: *Deleted

## 2016-05-03 MED ORDER — LISINOPRIL 40 MG PO TABS: 40.0000 mg | ORAL_TABLET | Freq: Every day | ORAL | 0 refills | Status: DC

## 2016-05-04 ENCOUNTER — Emergency Department (HOSPITAL_COMMUNITY)
Admission: EM | Admit: 2016-05-04 | Discharge: 2016-05-04 | Disposition: A | Discharge status: Home / Self Care | Payer: BLUE CROSS/BLUE SHIELD | Attending: Emergency Medicine | Admitting: Emergency Medicine

## 2016-05-04 ENCOUNTER — Encounter (HOSPITAL_COMMUNITY): Payer: Self-pay | Admitting: *Deleted

## 2016-05-04 DIAGNOSIS — K0889 Other specified disorders of teeth and supporting structures: Secondary | ICD-10-CM

## 2016-05-04 DIAGNOSIS — Z794 Long term (current) use of insulin: Secondary | ICD-10-CM | POA: Diagnosis not present

## 2016-05-04 DIAGNOSIS — G8929 Other chronic pain: Secondary | ICD-10-CM

## 2016-05-04 DIAGNOSIS — W19XXXD Unspecified fall, subsequent encounter: Secondary | ICD-10-CM | POA: Diagnosis not present

## 2016-05-04 DIAGNOSIS — M546 Pain in thoracic spine: Secondary | ICD-10-CM | POA: Diagnosis not present

## 2016-05-04 DIAGNOSIS — E119 Type 2 diabetes mellitus without complications: Secondary | ICD-10-CM | POA: Diagnosis not present

## 2016-05-04 DIAGNOSIS — K029 Dental caries, unspecified: Secondary | ICD-10-CM | POA: Insufficient documentation

## 2016-05-04 DIAGNOSIS — I10 Essential (primary) hypertension: Secondary | ICD-10-CM | POA: Insufficient documentation

## 2016-05-04 DIAGNOSIS — S29012D Strain of muscle and tendon of back wall of thorax, subsequent encounter: Secondary | ICD-10-CM

## 2016-05-04 DIAGNOSIS — Z7982 Long term (current) use of aspirin: Secondary | ICD-10-CM | POA: Diagnosis not present

## 2016-05-04 DIAGNOSIS — S299XXD Unspecified injury of thorax, subsequent encounter: Secondary | ICD-10-CM | POA: Diagnosis present

## 2016-05-04 MED ORDER — PENICILLIN V POTASSIUM 500 MG PO TABS: 500.0000 mg | ORAL_TABLET | Freq: Three times a day (TID) | ORAL | 0 refills | Status: DC

## 2016-05-04 MED ORDER — CYCLOBENZAPRINE HCL 10 MG PO TABS: 10.0000 mg | ORAL_TABLET | Freq: Three times a day (TID) | ORAL | 0 refills | Status: DC | PRN
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
## 2016-05-04 NOTE — ED Triage Notes (Signed)
Pt presents to ED with c/o dental pain on the left side (lower and upper) and is requesting referral to dentist. He also c/o neck and back pain, s/p fall last week.

## 2016-05-04 NOTE — Discharge Instructions (Signed)
Please read and follow all provided instructions.  Your diagnoses today include:  1. Pain, dental   2. Chronic left-sided thoracic back pain   3. Muscle strain of left upper back, subsequent encounter     The exam and treatment you received today has been provided on an emergency basis only. This is not a substitute for complete medical or dental care.  Tests performed today include:  Vital signs. See below for your results today.   Medications prescribed:   Penicillin - antibiotic  You have been prescribed an antibiotic medicine: take the entire course of medicine even if you are feeling better. Stopping early can cause the antibiotic not to work.   Flexeril (cyclobenzaprine) - muscle relaxer medication  DO NOT drive or perform any activities that require you to be awake and alert because this medicine can make you drowsy.   Take any prescribed medications only as directed.  Home care instructions:  Follow any educational materials contained in this packet.  Follow-up instructions: Please follow-up with your dentist for further evaluation of your symptoms.   Return instructions:   Please return to the Emergency Department if you experience worsening symptoms.  Please return if you develop a fever, you develop more swelling in your face or neck, you have trouble breathing or swallowing food.  Please return if you have any other emergent concerns.  Additional Information:  Your vital signs today were: BP (!) 141/109 (BP Location: Left Arm)    Pulse 109    Temp 97.8 F (36.6 C) (Oral)    Resp 18    Ht 5\' 7"  (1.702 m)    Wt 115.7 kg    SpO2 98%    BMI 39.94 kg/m  If your blood pressure (BP) was elevated above 135/85 this visit, please have this repeated by your doctor within one month. --------------

## 2016-05-04 NOTE — ED Notes (Signed)
Patient verbalizes understanding of discharge instructions, prescriptions, home care and follow up care. Patient out of department at this time. 

## 2016-05-05 ENCOUNTER — Encounter: Payer: Self-pay | Admitting: Family Medicine

## 2016-05-10 ENCOUNTER — Other Ambulatory Visit: Payer: Self-pay | Admitting: *Deleted

## 2016-05-10 NOTE — Telephone Encounter (Signed)
Pt needs a rx for a new meter as well. Calyn Sivils Bruna PotterBlount, CMA

## 2016-05-11 ENCOUNTER — Encounter: Payer: Self-pay | Admitting: Podiatry

## 2016-05-11 ENCOUNTER — Other Ambulatory Visit: Payer: Self-pay | Admitting: Family Medicine

## 2016-05-11 ENCOUNTER — Ambulatory Visit (INDEPENDENT_AMBULATORY_CARE_PROVIDER_SITE_OTHER): Payer: BLUE CROSS/BLUE SHIELD | Admitting: Podiatry

## 2016-05-11 VITALS — Ht 67.0 in | Wt 255.0 lb

## 2016-05-11 DIAGNOSIS — E119 Type 2 diabetes mellitus without complications: Secondary | ICD-10-CM

## 2016-05-11 DIAGNOSIS — M79676 Pain in unspecified toe(s): Secondary | ICD-10-CM | POA: Diagnosis not present

## 2016-05-11 DIAGNOSIS — B351 Tinea unguium: Secondary | ICD-10-CM

## 2016-05-11 MED ORDER — ONETOUCH ULTRA SYSTEM W/DEVICE KIT: 1.0000 | PACK | Freq: Once | 0 refills | Status: AC

## 2016-05-11 MED ORDER — DICLOFENAC SODIUM ER 100 MG PO TB24: 100.0000 mg | ORAL_TABLET | Freq: Every day | ORAL | 0 refills | Status: DC
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
## 2016-05-11 NOTE — Telephone Encounter (Signed)
Pt called and would like a new Glucose monitor and all the supplies that go with called in. jw

## 2016-05-11 NOTE — Telephone Encounter (Signed)
Rx for new meter sent.  CSD

## 2016-05-22 ENCOUNTER — Emergency Department (HOSPITAL_COMMUNITY)
Admission: EM | Admit: 2016-05-22 | Discharge: 2016-05-22 | Disposition: A | Discharge status: Home / Self Care | Payer: BLUE CROSS/BLUE SHIELD | Attending: Emergency Medicine | Admitting: Emergency Medicine

## 2016-05-22 ENCOUNTER — Encounter (HOSPITAL_COMMUNITY): Payer: Self-pay | Admitting: Emergency Medicine

## 2016-05-22 DIAGNOSIS — Z7982 Long term (current) use of aspirin: Secondary | ICD-10-CM | POA: Insufficient documentation

## 2016-05-22 DIAGNOSIS — E119 Type 2 diabetes mellitus without complications: Secondary | ICD-10-CM | POA: Insufficient documentation

## 2016-05-22 DIAGNOSIS — Z79899 Other long term (current) drug therapy: Secondary | ICD-10-CM | POA: Diagnosis not present

## 2016-05-22 DIAGNOSIS — I10 Essential (primary) hypertension: Secondary | ICD-10-CM | POA: Insufficient documentation

## 2016-05-22 DIAGNOSIS — S161XXD Strain of muscle, fascia and tendon at neck level, subsequent encounter: Secondary | ICD-10-CM | POA: Insufficient documentation

## 2016-05-22 DIAGNOSIS — S199XXD Unspecified injury of neck, subsequent encounter: Secondary | ICD-10-CM | POA: Diagnosis present

## 2016-05-22 DIAGNOSIS — Z794 Long term (current) use of insulin: Secondary | ICD-10-CM | POA: Insufficient documentation

## 2016-05-22 MED ORDER — METHOCARBAMOL 500 MG PO TABS: 500.0000 mg | ORAL_TABLET | Freq: Every evening | ORAL | 0 refills | Status: DC | PRN

## 2016-05-22 MED ORDER — IBUPROFEN 600 MG PO TABS: 600.0000 mg | ORAL_TABLET | Freq: Four times a day (QID) | ORAL | 0 refills | Status: DC | PRN
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
## 2016-05-22 NOTE — Discharge Instructions (Signed)
Take Ibuprofen for the next week. Take this medicine with food. °Take muscle relaxer at bedtime to help you sleep. This medicine makes you drowsy so do not take before driving or work °Use a heating pad for sore muscles - use for 20 minutes several times a day ° °
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
## 2016-05-23 ENCOUNTER — Ambulatory Visit: Payer: Self-pay | Admitting: Family Medicine

## 2016-05-23 ENCOUNTER — Encounter: Payer: Self-pay | Admitting: Family Medicine
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
## 2016-05-26 IMAGING — CR DG PELVIS 3+V JUDET
3 series · 3 of 3 positions shown · non-contrast
Comparison: 08/03/2014

CLINICAL DATA: Acetabular fracture

EXAM:
JUDET PELVIS - 3+ VIEW

[AP (1 of 3)]
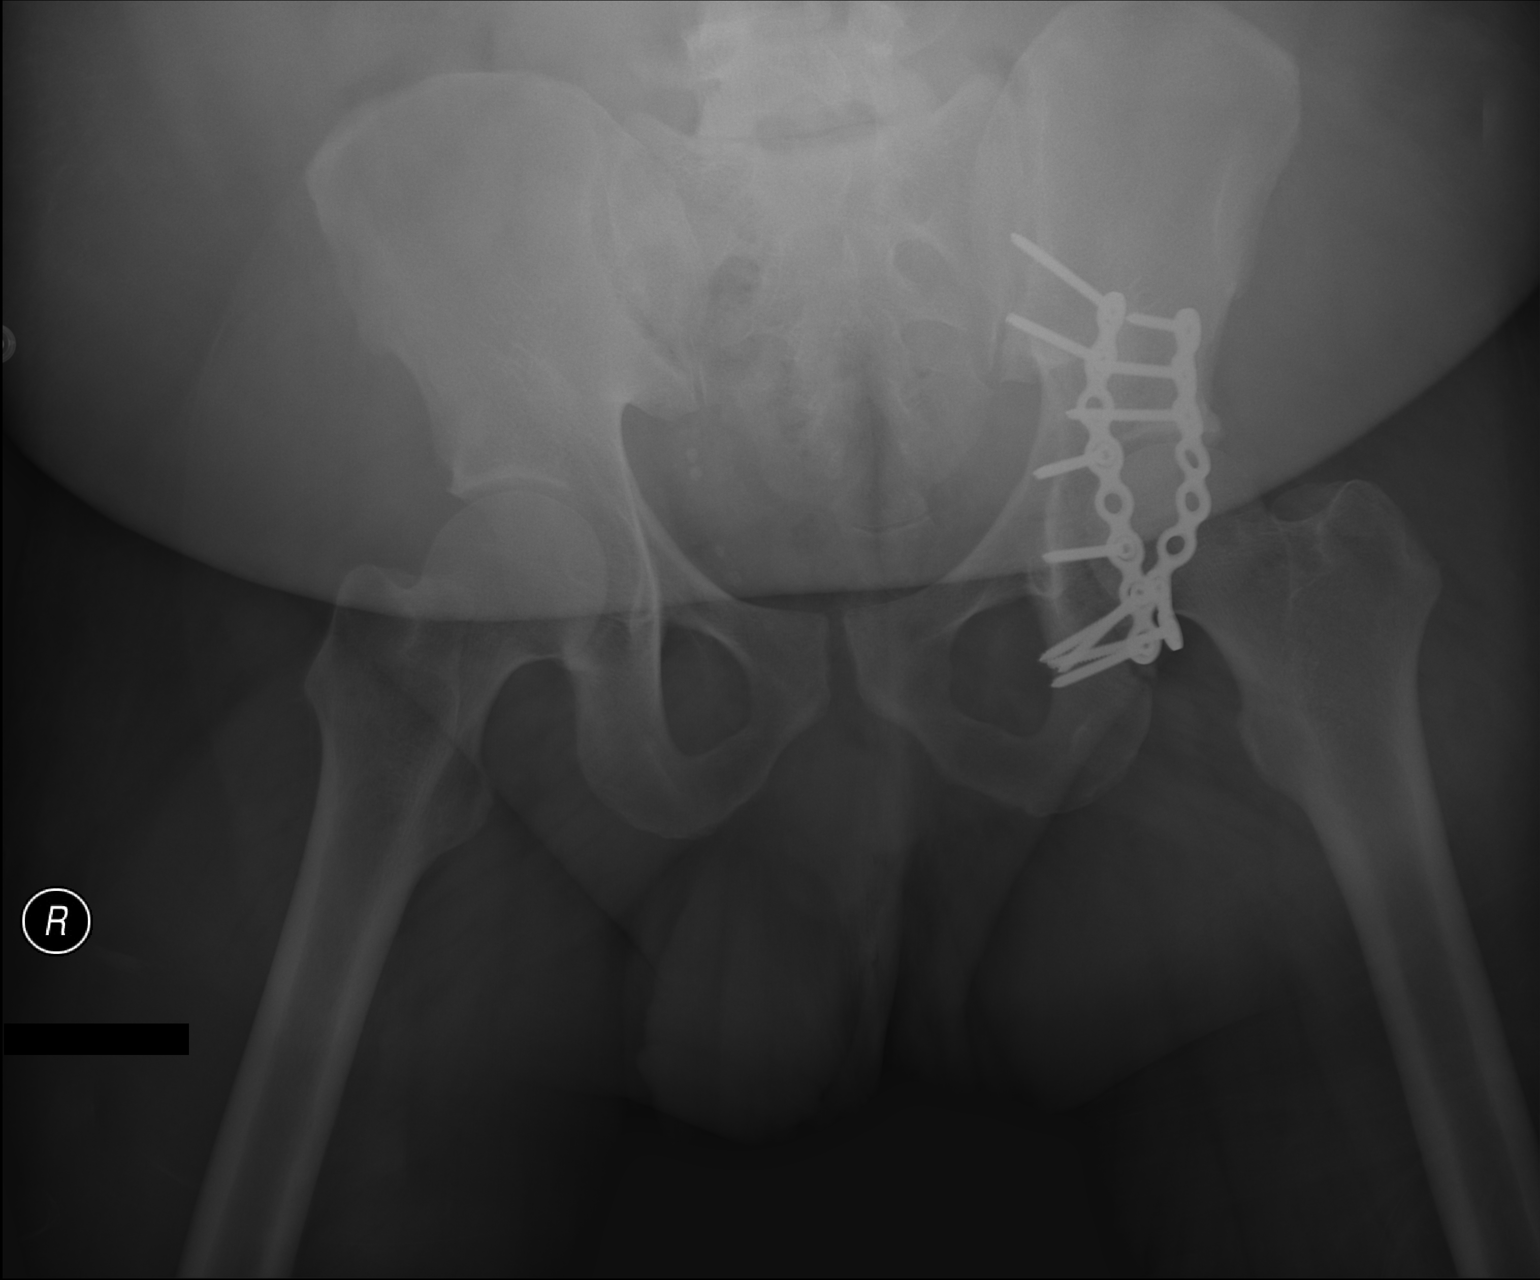

[AP (2 of 3)]
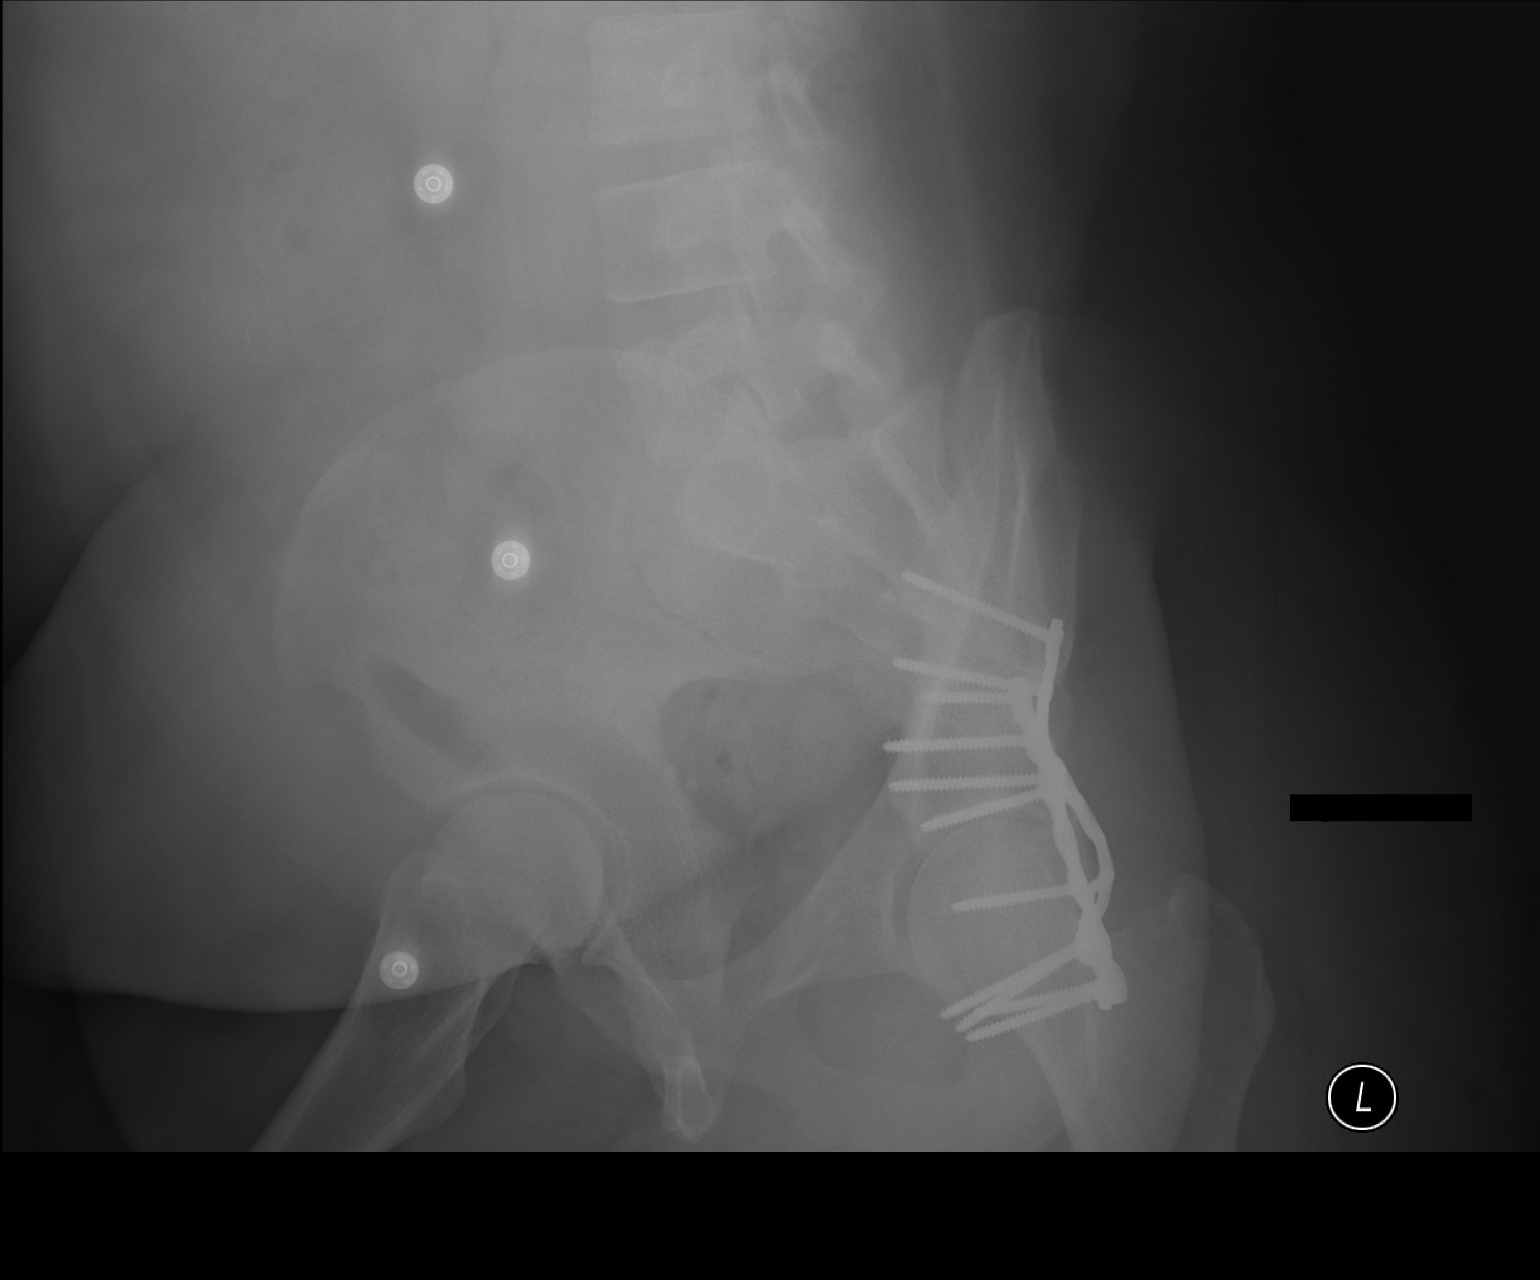

[AP (3 of 3)]
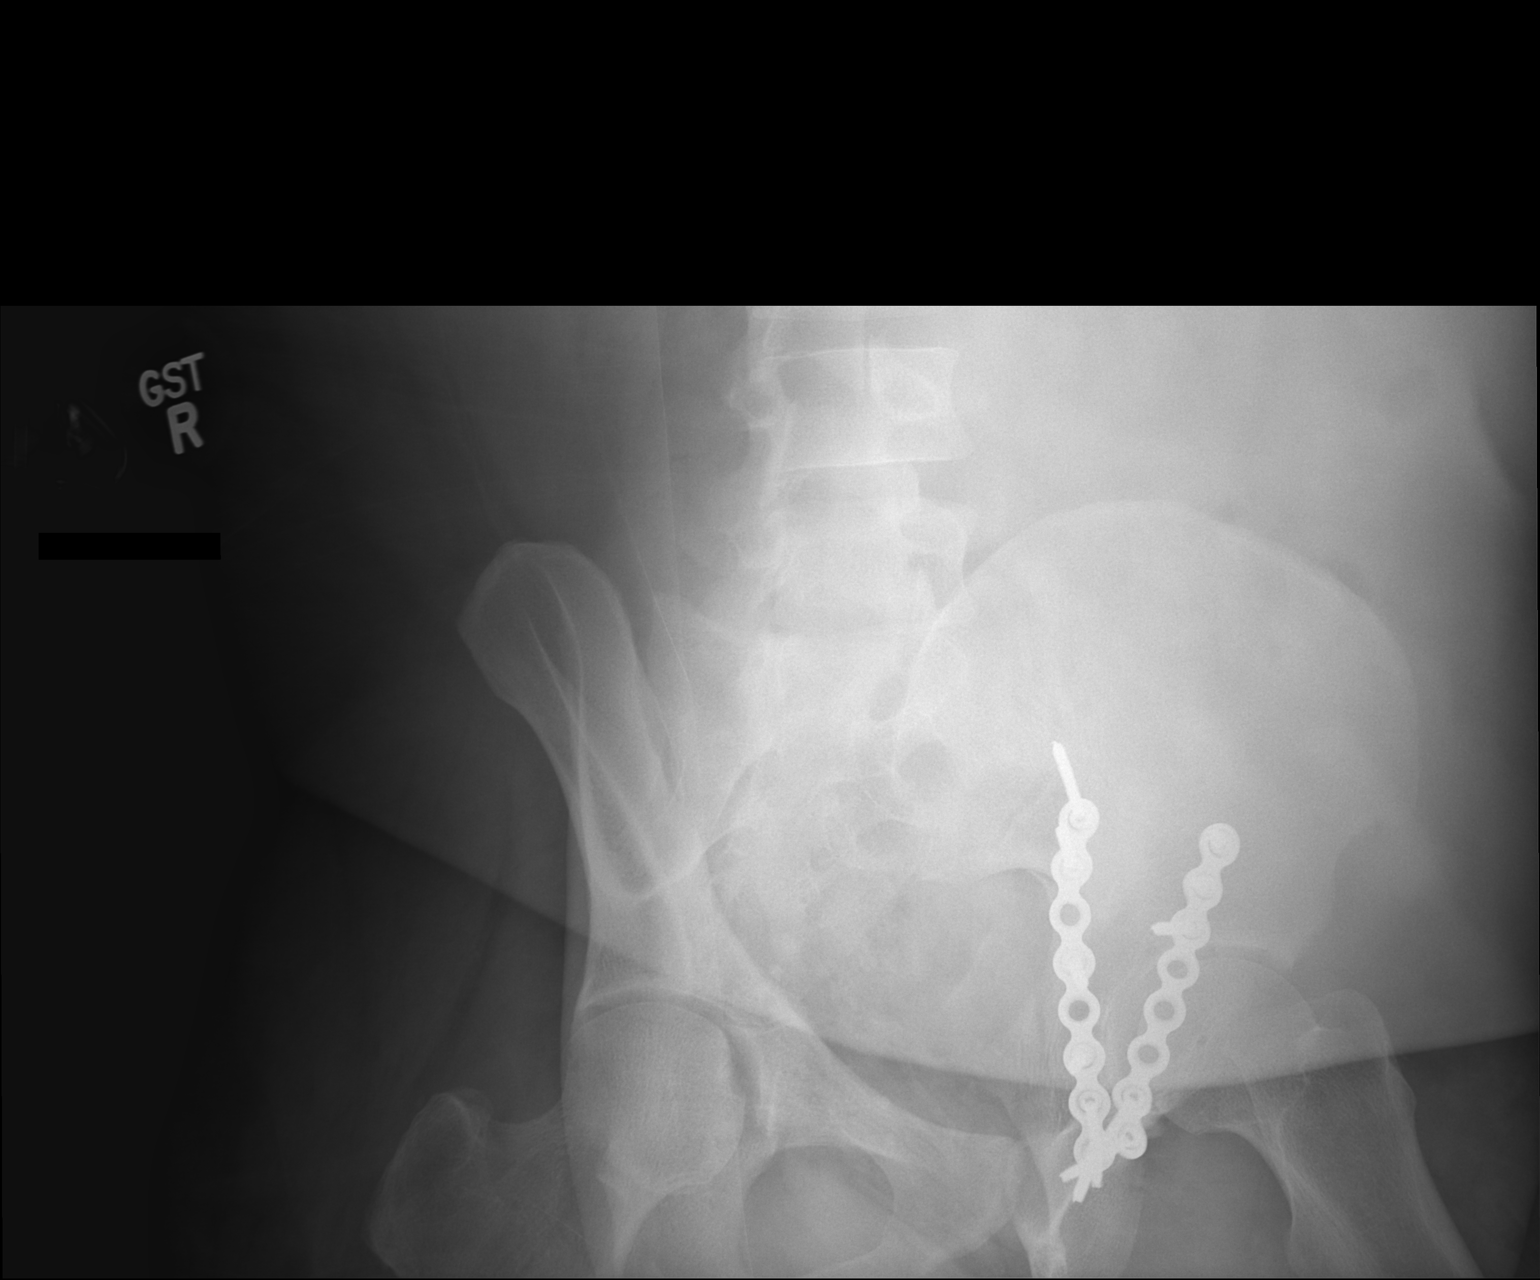

[3 of 3 positions shown; findings below may reference images not displayed]

FINDINGS: There is plate screw fixation of the acetabular fracture with
anatomic alignment of the major fracture fragments. Hip articulation
appears intact.
IMPRESSION: ORIF left acetabular fracture

## 2016-05-31 ENCOUNTER — Encounter: Payer: Self-pay | Admitting: Family Medicine

## 2016-06-05 ENCOUNTER — Ambulatory Visit: Payer: BLUE CROSS/BLUE SHIELD | Admitting: Allergy and Immunology

## 2016-06-06 ENCOUNTER — Encounter (HOSPITAL_COMMUNITY): Payer: Self-pay | Admitting: Emergency Medicine

## 2016-06-06 ENCOUNTER — Emergency Department (HOSPITAL_COMMUNITY)
Admission: EM | Admit: 2016-06-06 | Discharge: 2016-06-06 | Disposition: A | Discharge status: Home / Self Care | Payer: BLUE CROSS/BLUE SHIELD | Attending: Emergency Medicine | Admitting: Emergency Medicine

## 2016-06-06 ENCOUNTER — Encounter: Payer: Self-pay | Admitting: Family Medicine

## 2016-06-06 ENCOUNTER — Ambulatory Visit (INDEPENDENT_AMBULATORY_CARE_PROVIDER_SITE_OTHER): Payer: BLUE CROSS/BLUE SHIELD | Admitting: Family Medicine

## 2016-06-06 VITALS — BP 132/86 | HR 119 | Temp 98.0°F | Ht 67.5 in | Wt 263.4 lb

## 2016-06-06 DIAGNOSIS — Z1159 Encounter for screening for other viral diseases: Secondary | ICD-10-CM | POA: Diagnosis not present

## 2016-06-06 DIAGNOSIS — Z7982 Long term (current) use of aspirin: Secondary | ICD-10-CM | POA: Insufficient documentation

## 2016-06-06 DIAGNOSIS — Z5181 Encounter for therapeutic drug level monitoring: Secondary | ICD-10-CM | POA: Diagnosis not present

## 2016-06-06 DIAGNOSIS — Z794 Long term (current) use of insulin: Secondary | ICD-10-CM | POA: Diagnosis not present

## 2016-06-06 DIAGNOSIS — E118 Type 2 diabetes mellitus with unspecified complications: Secondary | ICD-10-CM

## 2016-06-06 DIAGNOSIS — Z79890 Hormone replacement therapy (postmenopausal): Secondary | ICD-10-CM

## 2016-06-06 DIAGNOSIS — E119 Type 2 diabetes mellitus without complications: Secondary | ICD-10-CM | POA: Diagnosis not present

## 2016-06-06 DIAGNOSIS — M542 Cervicalgia: Secondary | ICD-10-CM | POA: Diagnosis present

## 2016-06-06 DIAGNOSIS — Z832 Family history of diseases of the blood and blood-forming organs and certain disorders involving the immune mechanism: Secondary | ICD-10-CM | POA: Diagnosis not present

## 2016-06-06 DIAGNOSIS — Z79899 Other long term (current) drug therapy: Secondary | ICD-10-CM

## 2016-06-06 DIAGNOSIS — I10 Essential (primary) hypertension: Secondary | ICD-10-CM | POA: Diagnosis not present

## 2016-06-06 DIAGNOSIS — E349 Endocrine disorder, unspecified: Secondary | ICD-10-CM

## 2016-06-06 DIAGNOSIS — Z0000 Encounter for general adult medical examination without abnormal findings: Secondary | ICD-10-CM

## 2016-06-06 LAB — BASIC METABOLIC PANEL WITH GFR
BUN: 14 mg/dL (ref 7–25)
CO2: 23 mmol/L (ref 20–31)
Calcium: 9.5 mg/dL (ref 8.6–10.3)
Chloride: 101 mmol/L (ref 98–110)
Creat: 0.76 mg/dL (ref 0.60–1.35)
GFR, Est African American: >89 mL/min (ref >=60)
GFR, Est Non African American: >89 mL/min (ref >=60)
Glucose, Bld: 205 mg/dL — ABNORMAL HIGH (ref 65–99)
Potassium: 4.4 mmol/L (ref 3.5–5.3)
Sodium: 136 mmol/L (ref 135–146)

## 2016-06-06 LAB — HIV ANTIBODY (ROUTINE TESTING W REFLEX): HIV 1&2 Ab, 4th Generation: NONREACTIVE

## 2016-06-06 LAB — GLUCOSE, POCT (MANUAL RESULT ENTRY): POC Glucose: 210 mg/dl — AB (ref 70–99)

## 2016-06-06 MED ORDER — FLUOXETINE HCL 20 MG PO TABS: 20.0000 mg | ORAL_TABLET | Freq: Every day | ORAL | 0 refills | Status: DC

## 2016-06-06 MED ORDER — CYCLOBENZAPRINE HCL 10 MG PO TABS: 10.0000 mg | ORAL_TABLET | Freq: Three times a day (TID) | ORAL | 0 refills | Status: DC | PRN

## 2016-06-06 NOTE — Discharge Instructions (Signed)
Please read attached information. If you experience any new or worsening signs or symptoms please return to the emergency room for evaluation. Please follow-up with your primary care provider or specialist as discussed. Please use medication prescribed only as directed and discontinue taking if you have any concerning signs or symptoms.   °

## 2016-06-06 NOTE — Patient Instructions (Addendum)
Continue Lantus 26 units for now. Follow up with Dr. Raymondo BandKoval in our clinic to discuss your diabetes. Call Dr. Gerilyn PilgrimSykes to see if she'll reschedule your appointment.
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
## 2016-06-06 NOTE — Progress Notes (Signed)
41 y.o. year old male presents for a preventative visit.   Acute Concerns:  Can't recall the last time he saw orthopedist and cannot recall who he last saw.  He states that orthopedist stated it was up to his PCP to clear him for work which he would like to resume.   He states he's still getting pain medicine from the hand doctor [facility]) but is not restricted and has not seen him recently (he's just been getting it called in to his pharmacy. He states despite getting pain medicine from Dr. Norman Clay, he feels he is ready to go back to work, he only has mild decreased ROM in the R wrist.   The patient also wants to check HIV and kidneys.   At our last visit we discussed that his sister had a DVT and was found to have prothrombin G20210A mutation (factor II mutation) and factor V Leiden and wanted to inquire more about this.  Diabetes:  CBGs at home: 170 (mostly checks in the evening); a few months ago 70 was the lowest number.  Taking medications: lantus 26 units daily and metformin Side effects: none ROS: denies fever, chills, dizziness, LOC, polyuria, polydipsia, numbness or tingling in extremities or chest pain. Last eye exam: per report 3 weeks (we do not have documentation of this but pt states his eye doctor said he was worried diabetes affecting his right eye).  Last foot exam: 05/11/16 with podiatry Last A1c: 10.7 on 11/2 Nephropathy screen indicated?: no  Last flu, zoster and/or pneumovax: due for pneumococcal vaccine (pt declines)    Diet: varied, still having issues with appropriate carbohydrate control; pt has no showed to appts with Dr. Gerilyn Pilgrim in the past, he asks if I can set up an appt with her for him-- I note given his h/o of poor follow through, it would be best for him to contact her directly to attempt to schedule something.   Exercise: not regularly but intermittently walking   Social:  Social History   Social History  . Marital status: Married    Spouse name: N/A   . Number of children: N/A  . Years of education: N/A   Social History Main Topics  . Smoking status: Never Smoker  . Smokeless tobacco: Never Used  . Alcohol use No     Comment: Socially   . Drug use: No  . Sexual activity: Not Asked   Other Topics Concern  . None   Social History Narrative   Patient lives with his wife in safe, quiet apartment complex on the ground floor.   Has 6 children, all of whom live with his mother in [place], [place].   He and his wife share a car, this is how he gets to appts.   He states his strengths and assets are he is now complaint with his medications, good communication with his PCP, he has a good support system with his wife and spiritually (he's a [employer]).    His father, who passed away from ESRD while the patient was in prison, was stubborn and was never complaint with medications. The patient feels he has a lot of his father's traits but states his father told him to take better care of himself.    No longer drinking alcohol, previously used marijuana   Immunization: Immunization History  Administered Date(s) Administered  . Tdap 08/02/2014    Cancer Screening:  Colonoscopy: no family h/o colon cancer   Physical Exam: VITALS: Reviewed Blood pressure 132/86,  pulse (!) 119, temperature 98 F (36.7 C), temperature source Oral, height 5' 7.5" (1.715 m), weight 263 lb 6.4 oz (119.5 kg), SpO2 97 %. Repeat HR 103  GEN: Pleasant male, NAD HEENT: Normocephalic, PERRL, EOMI, no scleral icterus, bilateral TM pearly grey, nasal septum midline, MMM, uvula midline, no anterior or posterior lymphadenopathy, no thyromegaly CARDIAC:RRR, S1 and S2 present, no murmur, no heaves/thrills RESP: CTAB, normal effort ABD: soft, no tenderness, normal bowel sounds EXT: No edema, 2+ radial and DP pulses MSK: gait normal, 5/5 strength in UE and LE bilaterally. Mild decrease ROM in R wrist with flexion and extension. SKIN: no rash, dry skin   CBG:  210   ASSESSMENT & PLAN: 41 y.o. male presents for annual preventative exam. Please see problem specific assessment and plan.   Family history of prothrombin gene mutation Discussed that currently, the recommendation is against screening asymptomatic patients for this mutation as there is no change in management. Discussed importance of compression stockings for long travel with frequent breaks and signs/symptoms of VTE.   Family history of factor V Leiden mutation Discussed that the data varies on whether to test asymptomatic patients and noted he would need to check with his insurance company about coverage for this test. We discussed how management may change if we knew he had this mutation, specifically we may opt to anticoagulate him after certain operations.  We also discussed importance of compression stockings and frequent stops/leg exercises on long travel. Pt voiced understanding and noted he would opt against testing at this time (especially after talking to his sister who's doctor told her that management wouldn't significantly change for her family members), however he would like it noted in his chart that there was a family history.   Health maintenance examination Patient declined flu vaccine and pneumococcal vaccines. We discussed the risks.   Lipid panel due after 06/24/16, will f/u for this.  Will get BMET today as well as screening HIV per patient request.  Discussed regular exercise, balanced diet, depression, avoidance of drugs/alcohol, seat belt safety  Testosterone deficiency Patient has only taken 3 doses and has not taken in 2 months; states that he just doesn't have refills (refills are written for, pt just hasn't requested from pharmacy).  Discussed importance of taking regularly so we can monitor PSA and testosterone levels vs not taking at all. Pt states he'd start taking regularly. - will check PSA after 6-12 weeks of regularly taking it. - will check serum  testosterone levels 2-3 months after treatment initiation (midway b/t injections) with a goal level of 500-600ng/dl.   Diabetes (HCC) Patient's diabetes does not seem well controlled at all; he's also discussed a desire for alternative regimen's due to the cost but is hesitant to make any changes now. His compliance has always been concerning to me (as his reported CBGs tend to not correlate with A1c). Given CBG 210 here after eating and pt notes intermittently having low CBGs, will hold at Lantus 26 units. - repeat A1c at next appt - continue metformin  - pt advised to call Dr. Gerilyn Pilgrim to discuss nutrition further. - discussed at least 150 minutes of aerobic exercise daily. - pt asked to follow up with Dr. [name] to see if he could provide any further recommendations on how to improve compliance and glycemic control. Patient inquired about clinic samples, however I note samples are typically short term and not feasible for patients to get this monthly. - asked pt to have ophthalmology records faxed  over. - foot exam up to date  - pt on ASA and ACE inhibitor

## 2016-06-06 NOTE — ED Triage Notes (Signed)
Patient reports neck pain after "a fall off of a foot stool some months ago." Patient states he is here for a follow up because he is "still in pain and out of the muscle relaxer."  Seen for same x2 weeks ago. Denies head injury. Ambulatory to triage.

## 2016-06-07 ENCOUNTER — Encounter: Payer: Self-pay | Admitting: Family Medicine

## 2016-06-07 DIAGNOSIS — Z832 Family history of diseases of the blood and blood-forming organs and certain disorders involving the immune mechanism: Secondary | ICD-10-CM | POA: Insufficient documentation

## 2016-06-07 NOTE — Assessment & Plan Note (Signed)
Patient's diabetes does not seem well controlled at all; he's also discussed a desire for alternative regimen's due to the cost but is hesitant to make any changes now. His compliance has always been concerning to me (as his reported CBGs tend to not correlate with A1c). Given CBG 210 here after eating and pt notes intermittently having low CBGs, will hold at Lantus 26 units. - repeat A1c at next appt - continue metformin  - pt advised to call Dr. Gerilyn PilgrimSykes to discuss nutrition further. - discussed at least 150 minutes of aerobic exercise daily. - pt asked to follow up with Dr. Raymondo BandKoval to see if he could provide any further recommendations on how to improve compliance and glycemic control. Patient inquired about clinic samples, however I note samples are typically short term and not feasible for patients to get this monthly. - asked pt to have ophthalmology records faxed over. - foot exam up to date  - pt on ASA and ACE inhibitor

## 2016-06-07 NOTE — Assessment & Plan Note (Addendum)
Patient has only taken 3 doses and has not taken in 2 months; states that he just doesn't have refills (refills are written for, pt just hasn't requested from pharmacy).  Discussed importance of taking regularly so we can monitor PSA and testosterone levels vs not taking at all. Pt states he'd start taking regularly. - will check PSA after 6-12 weeks of regularly taking it. - will check serum testosterone levels 2-3 months after treatment initiation (midway b/t injections) with a goal level of 500-600ng/dl.

## 2016-06-07 NOTE — Assessment & Plan Note (Addendum)
Patient declined flu vaccine and pneumococcal vaccines. We discussed the risks.   Lipid panel due after 06/24/16, will f/u for this.  Will get BMET today as well as screening HIV per patient request.  Discussed regular exercise, balanced diet, depression, avoidance of drugs/alcohol, seat belt safety

## 2016-06-07 NOTE — Assessment & Plan Note (Addendum)
Discussed that currently, the recommendation is against screening asymptomatic patients for this mutation as there is no change in management. Discussed importance of compression stockings for long travel with frequent breaks and signs/symptoms of VTE.

## 2016-06-07 NOTE — Assessment & Plan Note (Signed)
Discussed that the data varies on whether to test asymptomatic patients and noted he would need to check with his insurance company about coverage for this test. We discussed how management may change if we knew he had this mutation, specifically we may opt to anticoagulate him after certain operations.  We also discussed importance of compression stockings and frequent stops/leg exercises on long travel. Pt voiced understanding and noted he would opt against testing at this time (especially after talking to his sister who's doctor told her that management wouldn't significantly change for her family members), however he would like it noted in his chart that there was a family history.

## 2016-06-08 ENCOUNTER — Telehealth: Payer: Self-pay | Admitting: Psychology

## 2016-06-08 ENCOUNTER — Ambulatory Visit: Payer: Self-pay | Admitting: Family Medicine

## 2016-06-08 ENCOUNTER — Telehealth: Payer: Self-pay | Admitting: Family Medicine

## 2016-06-08 NOTE — Telephone Encounter (Signed)
Spoke with patient THIS afternoon, and scheduled his appt for 3:30 today.  Patient agreed, and said his wife got off work at 3, so this should work for both of them to come to appt.  No call, and no show.    I called and left VM message asking him to call if he wants to R/S.

## 2016-06-08 NOTE — Telephone Encounter (Signed)
Patient wants appointment with Dr. Gerilyn PilgrimSykes but accidentally called me.  Gave him the correct number.  Will let Dr. Gerilyn PilgrimSykes know he is trying to reach her.

## 2016-06-14 ENCOUNTER — Ambulatory Visit: Payer: Self-pay | Admitting: Family Medicine

## 2016-06-14 ENCOUNTER — Encounter: Payer: Self-pay | Admitting: Family Medicine

## 2016-06-15 ENCOUNTER — Encounter: Payer: Self-pay | Admitting: Family Medicine

## 2016-06-15 ENCOUNTER — Encounter: Payer: Self-pay | Admitting: Internal Medicine

## 2016-06-27 ENCOUNTER — Ambulatory Visit: Payer: BLUE CROSS/BLUE SHIELD | Admitting: Allergy and Immunology

## 2016-07-10 ENCOUNTER — Ambulatory Visit: Payer: Self-pay | Admitting: Pharmacist

## 2016-09-04 ENCOUNTER — Other Ambulatory Visit: Payer: Self-pay

## 2016-09-06 ENCOUNTER — Ambulatory Visit: Payer: BLUE CROSS/BLUE SHIELD | Admitting: Podiatry

## 2016-10-31 ENCOUNTER — Ambulatory Visit: Payer: Self-pay | Admitting: Family Medicine

## 2016-11-01 ENCOUNTER — Encounter: Payer: Self-pay | Admitting: Family Medicine

## 2016-11-07 ENCOUNTER — Ambulatory Visit: Payer: BLUE CROSS/BLUE SHIELD | Admitting: Family Medicine

## 2016-11-08 ENCOUNTER — Encounter: Payer: Self-pay | Admitting: Family Medicine

## 2016-11-08 NOTE — Telephone Encounter (Signed)
Pt stated he would like to be up the letter for work tomorrow. ep

## 2016-11-09 ENCOUNTER — Other Ambulatory Visit: Payer: Self-pay | Admitting: Family Medicine

## 2016-11-21 ENCOUNTER — Ambulatory Visit: Payer: BLUE CROSS/BLUE SHIELD | Admitting: Family Medicine

## 2016-11-28 ENCOUNTER — Ambulatory Visit (INDEPENDENT_AMBULATORY_CARE_PROVIDER_SITE_OTHER): Payer: BLUE CROSS/BLUE SHIELD | Admitting: Family Medicine

## 2016-11-28 VITALS — BP 118/90 | HR 108 | Temp 98.1°F | Wt 239.4 lb

## 2016-11-28 DIAGNOSIS — Z0000 Encounter for general adult medical examination without abnormal findings: Secondary | ICD-10-CM

## 2016-11-28 DIAGNOSIS — E118 Type 2 diabetes mellitus with unspecified complications: Secondary | ICD-10-CM

## 2016-11-28 DIAGNOSIS — M542 Cervicalgia: Secondary | ICD-10-CM | POA: Diagnosis not present

## 2016-11-28 LAB — POCT GLYCOSYLATED HEMOGLOBIN (HGB A1C): Hemoglobin A1C: 10.6

## 2016-11-28 MED ORDER — ASPIRIN EC 81 MG PO TBEC: 81.0000 mg | DELAYED_RELEASE_TABLET | Freq: Every day | ORAL | 2 refills | Status: DC

## 2016-11-28 MED ORDER — LISINOPRIL 40 MG PO TABS: 40.0000 mg | ORAL_TABLET | Freq: Every day | ORAL | 2 refills | Status: DC

## 2016-11-28 MED ORDER — METHOCARBAMOL 500 MG PO TABS: 500.0000 mg | ORAL_TABLET | Freq: Every evening | ORAL | 0 refills | Status: DC | PRN

## 2016-11-28 NOTE — Patient Instructions (Signed)
You have a hemoglobin a1c draw today, that result is 10.6 which is slightly decreased from 10.7. Please keep your appointment with the nutritionist when scheduled You are being discharged with a refill for lisinopril, aspirin, and robaxin We discontinued your flexeril today You are ok to resume activity and diet as tolerated prior to your appointment.

## 2016-11-30 ENCOUNTER — Encounter: Payer: Self-pay | Admitting: Family Medicine

## 2016-11-30 DIAGNOSIS — M542 Cervicalgia: Secondary | ICD-10-CM | POA: Insufficient documentation

## 2016-11-30 NOTE — Assessment & Plan Note (Signed)
Patient with a1c of 10.6 which is down very slightly from 10.7 value in January 2018. Discussed at length with patient about barriers for getting his sugar under control. He states that he does take his medications, but that he has trouble controlling his diet. Encouraged him again about scheduling appointments with Dr. Gerilyn PilgrimSykes from nutrition and Dr. Raymondo BandKoval. Will repeat a1c in three months. Of not patient has lost 25 pounds and has been exercising, encouraged him to continue this activity level.  -A1c in three months - continue metformin - Pt to schedule appointment with dr. Gerilyn PilgrimSykes and dr. Raymondo BandKoval - continue exercise - performed foot exam, no neuropathy - continue asa, ACEi

## 2016-11-30 NOTE — Assessment & Plan Note (Signed)
Discussed neck pain with patient. Started in January while he was working on car. Has resolved but had slightly reinjured it a few weeks ago. Reviewed MRI which revealed diffuse osteo and disk changed. Will re-prescribe robaxin and stop flexeril as patient does not need both.

## 2016-11-30 NOTE — Assessment & Plan Note (Signed)
Discussed diabetic eye exam with patient. He claims to have gotten this done in January. Asked for him to please send us those records. Discussed pneumovax with patient. He denied on multiple occasions during visit, but said he would think about it. A1C draw.

## 2016-11-30 NOTE — Progress Notes (Signed)
HPI Patient presents to clinic today asking to have some of his medications refilled. He states that he need to have his aspirin, lisinopril, robaxin, and flexeril refilled. He states that he originally hurt his neck while working on a car back in January of this year. While the pain has slowly gotten better he said that he slightly re-injured his neck a few weeks ago. He states that the robaxin works better for him. He is also due for a hgb A1c check, pneumococcal vaccine, and vision screen. He states that he had the vision screen done in January of this year. Asked about the pneumococcal vaccine during this appointment and he declined several times. Patient has lost 25 pounds since January, and he states it is intentional.   CC: "needs some medications refilled"   ROS: Gen: no n/v/d/c, no f/c/s Pulm: no shortness of breath, no chest pain Cardio: no chest pain, no syncope Gi: no n/v/d Ext: able to move all four extremities  Review of Systems See HPI for ROS.   CC, SH/smoking status, and VS noted  Objective: BP 118/90   Pulse (!) 108   Temp 98.1 F (36.7 C) (Oral)   Wt 239 lb 6.4 oz (108.6 kg)   SpO2 97%   BMI 36.94 kg/m  Gen: NAD, alert, cooperative, and pleasant HEENT: NCAT, EOMI, PERRL CV: RRR, no murmur Resp: CTAB, no wheezes, non-labored Abd: SNTND, BS present, no guarding or organomegaly Ext: No edema, warm Neck: patient with slight point tenderness on right posterior neck in muscle belly Neuro: Alert and oriented, Speech clear, No gross deficits   Assessment and plan:  Diabetes [facility]) Patient with a1c of 10.6 which is down very slightly from 10.7 value in January 2018. Discussed at length with patient about barriers for getting his sugar under control. He states that he does take his medications, but that he has trouble controlling his diet. Encouraged him again about scheduling appointments with Dr. Gerilyn Pilgrim from nutrition and Dr. [name]. Will repeat a1c in three  months. Of not patient has lost 25 pounds and has been exercising, encouraged him to continue this activity level.  -A1c in three months - continue metformin - Pt to schedule appointment with dr. Gerilyn Pilgrim and dr. [name] - continue exercise - performed foot exam, no neuropathy - continue asa, ACEi  Health maintenance examination Discussed diabetic eye exam with patient. He claims to have gotten this done in January. Asked for him to please send [place] those records. Discussed pneumovax with patient. He denied on multiple occasions during visit, but said he would think about it. A1C draw.  Neck pain Discussed neck pain with patient. Started in January while he was working on car. Has resolved but had slightly reinjured it a few weeks ago. Reviewed MRI which revealed diffuse osteo and disk changed. Will re-prescribe robaxin and stop flexeril as patient does not need both.   Orders Placed This Encounter  Procedures  . HgB A1c    Meds ordered this encounter  Medications  . aspirin EC 81 MG tablet    Sig: Take 1 tablet (81 mg total) by mouth daily.    Dispense:  90 tablet    Refill:  2  . lisinopril (PRINIVIL,ZESTRIL) 40 MG tablet    Sig: Take 1 tablet (40 mg total) by mouth daily.    Dispense:  90 tablet    Refill:  2  . methocarbamol (ROBAXIN) 500 MG tablet    Sig: Take 1 tablet (500 mg total) by  mouth at bedtime and may repeat dose one time if needed.    Dispense:  30 tablet    Refill:  0    Myrene BuddyJacob Johathon Overturf MD PGY-1 Family Medicine Resident 11/30/2016 11:25 PM

## 2017-03-29 ENCOUNTER — Other Ambulatory Visit: Payer: Self-pay | Admitting: *Deleted

## 2017-04-12 ENCOUNTER — Ambulatory Visit: Payer: BLUE CROSS/BLUE SHIELD | Admitting: Family Medicine

## 2017-04-18 ENCOUNTER — Other Ambulatory Visit: Payer: Self-pay | Admitting: *Deleted

## 2017-04-18 DIAGNOSIS — Z794 Long term (current) use of insulin: Secondary | ICD-10-CM

## 2017-04-18 DIAGNOSIS — E119 Type 2 diabetes mellitus without complications: Secondary | ICD-10-CM

## 2017-04-19 ENCOUNTER — Other Ambulatory Visit: Payer: Self-pay | Admitting: Family Medicine

## 2017-04-19 MED ORDER — METFORMIN HCL 1000 MG PO TABS: 1000.0000 mg | ORAL_TABLET | Freq: Two times a day (BID) | ORAL | 3 refills | Status: DC

## 2017-04-19 MED ORDER — LISINOPRIL 40 MG PO TABS: 40.0000 mg | ORAL_TABLET | Freq: Every day | ORAL | 2 refills | Status: DC

## 2017-06-15 ENCOUNTER — Encounter: Payer: Self-pay | Admitting: Family Medicine

## 2017-07-04 ENCOUNTER — Ambulatory Visit: Payer: Medicaid Other | Admitting: Internal Medicine

## 2017-07-04 ENCOUNTER — Other Ambulatory Visit: Payer: Self-pay

## 2017-07-04 ENCOUNTER — Encounter: Payer: Self-pay | Admitting: Internal Medicine

## 2017-07-04 VITALS — BP 110/80 | HR 115 | Temp 98.7°F | Ht 67.5 in | Wt 260.0 lb

## 2017-07-04 DIAGNOSIS — E118 Type 2 diabetes mellitus with unspecified complications: Secondary | ICD-10-CM

## 2017-07-04 DIAGNOSIS — K0889 Other specified disorders of teeth and supporting structures: Secondary | ICD-10-CM

## 2017-07-04 NOTE — Assessment & Plan Note (Signed)
Has appt with PCP Dr. Primitivo GauzeFletcher next week. Due for A1C. As no POC A1C at this time (no cartridges available), will obtain A1C today so this is available by appt next week.

## 2017-07-04 NOTE — Progress Notes (Signed)
   Subjective:   Patient: Marcus [name]       Birthdate: 1976-01-21       MRN: [id number]      HPI  Marcus Bassetrick T Valido is a 42 y.o. male presenting for same day visit for tooth pain.   CC: "I need pain meds"  Tooth pain Began 3d ago when he says part of his tooth broke off while eating an apple. He is requesting pain medication fort his issue today. Since then has had pain in that area. Has had some difficulty eating hard foods, but is drinking well. Has been taking ibuprofen and Tylenol for pain. Denies fevers, chills. Has appt with his dentist ([facility]) on [address]. Was last seen there last year for an annual cleaning. Has tried to get sooner appointment there but was told there were no sooner openings and he would be put on a waiting list.   Smoking status reviewed. Patient is never smoker.   Review of Systems See HPI.     Objective:  Physical Exam  Constitutional: He is oriented to person, place, and time and well-developed, well-nourished, and in no distress.  HENT:  Head: Normocephalic and atraumatic.  Mouth/Throat: Oropharynx is clear and moist. No oropharyngeal exudate.  Broken tooth (top L first molar or premolar (difficult to distinguish as patient missing teeth)). No bleeding. No signs of infection or abscess. Generally poor dentition.   Pulmonary/Chest: Effort normal. No respiratory distress.  Neurological: He is alert and oriented to person, place, and time.  Skin: Skin is warm and dry.  Psychiatric: Affect and judgment normal.      Assessment & Plan:  Diabetes (HCC) Has appt with PCP Dr. Primitivo GauzeFletcher next week. Due for A1C. As no POC A1C at this time (no cartridges available), will obtain A1C today so this is available by appt next week.   Tooth pain After breaking tooth 3d ago. No signs of abscess or infection on exam. Afebrile and well-appearing. Reports drinking well and is well-hydrated on exam. Is able to eat foods, just not hard or very cold foods. Discussed  that we do not prescribe narcotics for tooth pain, and that he should call his dentist to try to get an earlier appt/make sure he is still on the waiting list. Can also continue ibuprofen/Tylenol in the meantime. Also discussed putting ice pack or heating pad on his cheek to try to improve pain.  - F/u at CPE w/Dr. Primitivo GauzeFletcher in one week if has not been seen by dentist by then   Tarri AbernethyAbigail J Jesslyn Viglione, MD, MPH PGY-3 [facility] Pager [phone]

## 2017-07-04 NOTE — Patient Instructions (Addendum)
It was nice meeting you today [name]!  Please call your dentist's office and make sure they have not had any cancellations. While waiting for your appointment, you can continue to take ibuprofen or Tylenol every 4-6 hours as needed for pain. You can also put ice or a warm compress (whichever feels better) on your cheek to help with pain. Try to avoid hard foods and very cold foods, as these will likely worsen your pain.   If you have any questions or concerns, please feel free to call the clinic.   Be well,  Dr. Natale MilchLancaster

## 2017-07-04 NOTE — Assessment & Plan Note (Signed)
After breaking tooth 3d ago. No signs of abscess or infection on exam. Afebrile and well-appearing. Reports drinking well and is well-hydrated on exam. Is able to eat foods, just not hard or very cold foods. Discussed that we do not prescribe narcotics for tooth pain, and that he should call his dentist to try to get an earlier appt/make sure he is still on the waiting list. Can also continue ibuprofen/Tylenol in the meantime. Also discussed putting ice pack or heating pad on his cheek to try to improve pain.  - F/u at CPE w/Dr. Primitivo GauzeFletcher in one week if has not been seen by dentist by then

## 2017-07-05 ENCOUNTER — Other Ambulatory Visit: Payer: Self-pay

## 2017-07-05 ENCOUNTER — Encounter (HOSPITAL_COMMUNITY): Payer: Self-pay | Admitting: Emergency Medicine

## 2017-07-05 ENCOUNTER — Emergency Department (HOSPITAL_COMMUNITY)
Admission: EM | Admit: 2017-07-05 | Discharge: 2017-07-05 | Disposition: A | Discharge status: Home / Self Care | Payer: Medicaid Other | Attending: Emergency Medicine | Admitting: Emergency Medicine

## 2017-07-05 DIAGNOSIS — E119 Type 2 diabetes mellitus without complications: Secondary | ICD-10-CM | POA: Insufficient documentation

## 2017-07-05 DIAGNOSIS — Z79899 Other long term (current) drug therapy: Secondary | ICD-10-CM | POA: Diagnosis not present

## 2017-07-05 DIAGNOSIS — I10 Essential (primary) hypertension: Secondary | ICD-10-CM | POA: Diagnosis not present

## 2017-07-05 DIAGNOSIS — Z794 Long term (current) use of insulin: Secondary | ICD-10-CM | POA: Insufficient documentation

## 2017-07-05 DIAGNOSIS — K0889 Other specified disorders of teeth and supporting structures: Secondary | ICD-10-CM

## 2017-07-05 DIAGNOSIS — Z7982 Long term (current) use of aspirin: Secondary | ICD-10-CM | POA: Insufficient documentation

## 2017-07-05 LAB — HEMOGLOBIN A1C
Est. average glucose Bld gHb Est-mCnc: 378 mg/dL
Hgb A1c MFr Bld: 14.8 % — ABNORMAL HIGH (ref 4.8–5.6)

## 2017-07-05 MED ORDER — NAPROXEN 500 MG PO TABS
500.0000 mg | ORAL_TABLET | Freq: Two times a day (BID) | ORAL | 0 refills | Status: DC
Start: 2017-07-05 — End: 2019-04-15

## 2017-07-05 MED ORDER — OXYCODONE-ACETAMINOPHEN 5-325 MG PO TABS
1.0000 | ORAL_TABLET | Freq: Once | ORAL | Status: AC
  Administered 2017-07-05: 1 via ORAL
  Filled 2017-07-05: qty 1

## 2017-07-05 MED ORDER — PENICILLIN V POTASSIUM 500 MG PO TABS: 500.0000 mg | ORAL_TABLET | Freq: Four times a day (QID) | ORAL | 0 refills | Status: AC

## 2017-07-05 NOTE — ED Triage Notes (Signed)
Pt complaint of left upper dental pain worsening over past 3 days.

## 2017-07-05 NOTE — Discharge Instructions (Signed)
Please read attached information regarding your condition. Take the entire course of penicillin as directed.  Do not stop if your symptoms improve. Follow-up with your dentist for further evaluation. Return to ED for worsening symptoms, trouble breathing, trouble swallowing, neck pain or neck swelling.

## 2017-07-05 NOTE — ED Notes (Signed)
ED Provider at bedside. 
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
## 2017-07-05 NOTE — ED Notes (Signed)
Bed: [facility] Expected date:  Expected time:  Means of arrival:  Comments: 

## 2017-07-06 ENCOUNTER — Telehealth: Payer: Self-pay | Admitting: Family Medicine

## 2017-07-06 NOTE — Telephone Encounter (Signed)
Pt came in office dropped a Medical Clearance for Dental Treatment form to be answer and sign by MD. [facility] [phone]. Last DOS 07/04/2017. Best phone # to call is [phone]. Form was placed in [employer] folder.

## 2017-07-09 ENCOUNTER — Ambulatory Visit (HOSPITAL_COMMUNITY)
Admission: EM | Admit: 2017-07-09 | Discharge: 2017-07-09 | Disposition: A | Discharge status: Home / Self Care | Payer: Medicaid Other | Attending: Family Medicine | Admitting: Family Medicine

## 2017-07-09 ENCOUNTER — Other Ambulatory Visit: Payer: Self-pay

## 2017-07-09 ENCOUNTER — Encounter (HOSPITAL_COMMUNITY): Payer: Self-pay | Admitting: Emergency Medicine

## 2017-07-09 DIAGNOSIS — K0889 Other specified disorders of teeth and supporting structures: Secondary | ICD-10-CM

## 2017-07-09 MED ORDER — AMOXICILLIN-POT CLAVULANATE 875-125 MG PO TABS: 1.0000 | ORAL_TABLET | Freq: Two times a day (BID) | ORAL | 0 refills | Status: DC

## 2017-07-09 MED ORDER — HYDROCODONE-ACETAMINOPHEN 5-325 MG PO TABS: 1.0000 | ORAL_TABLET | Freq: Four times a day (QID) | ORAL | 0 refills | Status: DC | PRN
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
## 2017-07-09 NOTE — ED Triage Notes (Signed)
Onset of tooth pain was 3 days ago.  Left side face hurting.  Tooth that hurts is on top, left.

## 2017-07-09 NOTE — Telephone Encounter (Signed)
Reviewed Medical Clearance form and placed in PCP's box.Glennie HawkSimpson, Latoy Labriola R, CMA

## 2017-07-12 ENCOUNTER — Other Ambulatory Visit: Payer: Self-pay

## 2017-07-12 ENCOUNTER — Encounter: Payer: Self-pay | Admitting: Family Medicine

## 2017-07-12 ENCOUNTER — Ambulatory Visit (INDEPENDENT_AMBULATORY_CARE_PROVIDER_SITE_OTHER): Payer: Medicaid Other | Admitting: Family Medicine

## 2017-07-12 VITALS — BP 136/90 | HR 122 | Temp 98.3°F | Wt 260.0 lb

## 2017-07-12 DIAGNOSIS — K0889 Other specified disorders of teeth and supporting structures: Secondary | ICD-10-CM | POA: Diagnosis present

## 2017-07-12 DIAGNOSIS — K921 Melena: Secondary | ICD-10-CM

## 2017-07-12 DIAGNOSIS — R42 Dizziness and giddiness: Secondary | ICD-10-CM

## 2017-07-12 DIAGNOSIS — E118 Type 2 diabetes mellitus with unspecified complications: Secondary | ICD-10-CM | POA: Diagnosis not present

## 2017-07-12 MED ORDER — OXYCODONE-ACETAMINOPHEN 7.5-325 MG PO TABS: 1.0000 | ORAL_TABLET | Freq: Four times a day (QID) | ORAL | 0 refills | Status: DC | PRN

## 2017-07-12 MED ORDER — LISINOPRIL 40 MG PO TABS: 40.0000 mg | ORAL_TABLET | Freq: Every day | ORAL | 2 refills | Status: DC

## 2017-07-12 MED ORDER — ONETOUCH ULTRASOFT LANCETS MISC: 12 refills | Status: DC

## 2017-07-12 MED ORDER — GLUCOSE BLOOD VI STRP: ORAL_STRIP | 12 refills | Status: DC

## 2017-07-12 MED ORDER — AMLODIPINE BESYLATE 10 MG PO TABS: 10.0000 mg | ORAL_TABLET | Freq: Every day | ORAL | 2 refills | Status: DC

## 2017-07-12 MED ORDER — INSULIN GLARGINE 100 UNITS/ML SOLOSTAR PEN: 13.0000 [IU] | PEN_INJECTOR | Freq: Every day | SUBCUTANEOUS | 0 refills | Status: DC

## 2017-07-12 NOTE — Patient Instructions (Addendum)
It was great seeing you today Mr. Baron Hamperrotter. I believe your black stools are due to pepto bismol use. I will give you a prescription for vicodin to help until you are able to see your dentist on 3/6. Regarding your dizziness it is likely due to a combination of very high blood sugars, trazodone, and possibly anxiety.   Regarding your blood sugars. Since your A1C is very high I think we should restart insulin. I want to start you on half of your previous dose of lantus. You should write down your sugars 4 times per day (3 meals plus bedtime) for the next two weeks. We can adjust your regimen based on how your sugars responded. I will see you back in 2 weeks for diabetes follow up and check on your teeth.

## 2017-07-13 MED ORDER — BLOOD GLUCOSE METER KIT: PACK | 0 refills | Status: DC

## 2017-07-13 MED ORDER — INSULIN STARTER KIT- PEN NEEDLES (ENGLISH): 1.0000 | Freq: Once | 0 refills | Status: AC

## 2017-07-13 NOTE — Telephone Encounter (Signed)
I have filled out the clearance form and placed in box.  Myrene BuddyJacob Hitoshi Werts MD PGY-1 Family Medicine Resident

## 2017-07-13 NOTE — Telephone Encounter (Signed)
Printed off script for his insulin starter kit and script for glucose meter. Called patient and informed him this was ready for pickup.  Guadalupe Dawn MD PGY-1 Family Medicine Resident

## 2017-07-16 DIAGNOSIS — R42 Dizziness and giddiness: Secondary | ICD-10-CM | POA: Insufficient documentation

## 2017-07-16 DIAGNOSIS — K921 Melena: Secondary | ICD-10-CM | POA: Insufficient documentation

## 2017-07-16 NOTE — Assessment & Plan Note (Signed)
Unclear etiology especially since patient is having a hard time describing his symptoms. Could be medication induced. Another possibility is likely hyperglycemia. If starts occurring outside of him laying in bed will consider further workup.

## 2017-07-16 NOTE — Progress Notes (Signed)
HPI 42 year old who presents with complaints of black stools, dizziness, and mouth pain.  Black Stools Patient states that this has been going on for about a week. No related symptoms, no abdominal pain. Has been taking a good amount of pepto bismol for indigestion.  Dizziness Patient states that he has had dizziness "a couple" of times, always at night right before he is going to sleep. He has not had any dizziness with standing, and has a hard time describing exactly what he means by dizzy. He does take trazodone at night for sleep.  Mouth Pain Patient has been dealing with mouth pain for "quite some time". He has an appointment with his dentist on 3/6. He is hoping for a refill of percocet to last until he can make it to that appointment. He has a medical clearance for for this procedure.  T2 DM A1C up to 14.8 on 2/20. Significant increase from 10.6 on 7/17. States that he has not been taking any insulin as he did not have insurance for a prolonged period of time. He now states that he has medicaid and will be able to afford insulin. Despite he very elevated level he has not been having any symptoms aside from some increased urination especially at night. Has still been taking his metformin as prescribed.  CC: follow up  ROS: Review of Systems  Constitutional: Negative for chills and fever.  HENT: Negative for congestion, nosebleeds and sinus pain.        Mouth pain  Eyes: Negative for pain, discharge and redness.  Respiratory: Negative for cough, hemoptysis and wheezing.   Cardiovascular: Negative for chest pain and palpitations.  Gastrointestinal: Negative for abdominal pain, constipation, diarrhea, nausea and vomiting.       Black stools  Genitourinary: Positive for urgency. Negative for dysuria.  Musculoskeletal: Negative for myalgias.  Neurological: Negative for dizziness, weakness and headaches.    Review of Systems See HPI for ROS.   CC, SH/smoking status, and VS  noted  Objective: BP 136/90   Pulse (!) 122   Temp 98.3 F (36.8 C) (Oral)   Wt 117.9 kg (260 lb)   SpO2 96%   BMI 40.12 kg/m  Gen: NAD, alert, cooperative, and pleasant HEENT: NCAT, EOMI, PERRL CV: RRR, no murmur Resp: CTAB, no wheezes, non-labored Abd: SNTND, BS present, no guarding or organomegaly Ext: No edema, warm Neuro: Alert and oriented, Speech clear, No gross deficits   Assessment and plan:  Type 2 diabetes mellitus with complication (HCC) Very elevated a1c. Will need to start back on insulin. Reviewed record and was previously on 26 U lantus. Has not been taking insulin for prolonged period so do not want to cause hypoglycemia. Will start back on 13U. Have asked patient to chart sugars at all major meals and at night for 2 weeks. Will use this chart to adjust lantus dosing and plan short acting regimen. Continue metformin. - lantus 13 U - metformin 1g bid - chart sugars ac hs for 2 weeks - follow up at that time for adjustment  Tooth pain Refilled norco to last until 3/6 and no later. By accounts needs fairly extensive dental work. - dental procedure on 3/6 - pain control as above  Black stools Likely due to pepto bismol use. Have asked patient to monitor when he stops taking pepto. If still having black stools will need to follow up.  Dizziness Unclear etiology especially since patient is having a hard time describing his symptoms. Could  be medication induced. Another possibility is likely hyperglycemia. If starts occurring outside of him laying in bed will consider further workup.   No orders of the defined types were placed in this encounter.   Meds ordered this encounter  Medications  . insulin glargine (LANTUS) 100 unit/mL SOPN    Sig: Inject 0.13 mLs (13 Units total) into the skin daily.    Dispense:  15 mL    Refill:  0  . glucose blood test strip    Sig: Use to check blood sugars three times per day.    Dispense:  100 each    Refill:  12  .  Lancets (ONETOUCH ULTRASOFT) lancets    Sig: Use to check blood sugars three times per day.    Dispense:  100 each    Refill:  12  . oxyCODONE-acetaminophen (PERCOCET) 7.5-325 MG tablet    Sig: Take 1 tablet by mouth every 6 (six) hours as needed.    Dispense:  30 tablet    Refill:  0  . amLODipine (NORVASC) 10 MG tablet    Sig: Take 1 tablet (10 mg total) by mouth daily.    Dispense:  90 tablet    Refill:  2  . lisinopril (PRINIVIL,ZESTRIL) 40 MG tablet    Sig: Take 1 tablet (40 mg total) by mouth daily.    Dispense:  90 tablet    Refill:  2  . blood glucose meter kit and supplies    Sig: Dispense based on patient and insurance preference. Use up to four times daily as directed. (FOR ICD-10 E10.9, E11.9).    Dispense:  1 each    Refill:  0    Please prescribe accucheck glide, or equivalent device that is covered by medicaid    Order Specific Question:   Number of strips    Answer:   100    Order Specific Question:   Number of lancets    Answer:   100  . insulin starter kit- pen needles MISC    Sig: 1 kit by Other route once for 1 dose.    Dispense:  1 kit    Refill:  0     Guadalupe Dawn MD PGY-1 Family Medicine Resident 07/16/2017 7:40 AM

## 2017-07-16 NOTE — Assessment & Plan Note (Signed)
Very elevated a1c. Will need to start back on insulin. Reviewed record and was previously on 26 U lantus. Has not been taking insulin for prolonged period so do not want to cause hypoglycemia. Will start back on 13U. Have asked patient to chart sugars at all major meals and at night for 2 weeks. Will use this chart to adjust lantus dosing and plan short acting regimen. Continue metformin. - lantus 13 U - metformin 1g bid - chart sugars ac hs for 2 weeks - follow up at that time for adjustment

## 2017-07-16 NOTE — Assessment & Plan Note (Signed)
Likely due to pepto bismol use. Have asked patient to monitor when he stops taking pepto. If still having black stools will need to follow up.

## 2017-07-16 NOTE — Assessment & Plan Note (Signed)
Refilled norco to last until 3/6 and no later. By accounts needs fairly extensive dental work. - dental procedure on 3/6 - pain control as above

## 2017-07-17 ENCOUNTER — Encounter: Payer: Self-pay | Admitting: Family Medicine

## 2017-07-17 NOTE — Telephone Encounter (Signed)
Called [facility]. They do have the paperwork and everything is good to go. Called patient and let him know that everything is in order.  Myrene BuddyJacob Emmaline Wahba MD PGY-1 Family Medicine Resident

## 2017-07-27 ENCOUNTER — Ambulatory Visit (INDEPENDENT_AMBULATORY_CARE_PROVIDER_SITE_OTHER): Payer: Medicaid Other | Admitting: Family Medicine

## 2017-07-27 ENCOUNTER — Encounter: Payer: Self-pay | Admitting: Family Medicine

## 2017-07-27 VITALS — BP 140/90 | HR 119 | Temp 98.4°F | Wt 263.6 lb

## 2017-07-27 DIAGNOSIS — K0889 Other specified disorders of teeth and supporting structures: Secondary | ICD-10-CM | POA: Diagnosis not present

## 2017-07-27 DIAGNOSIS — E118 Type 2 diabetes mellitus with unspecified complications: Secondary | ICD-10-CM | POA: Diagnosis present

## 2017-07-27 DIAGNOSIS — R42 Dizziness and giddiness: Secondary | ICD-10-CM

## 2017-07-27 DIAGNOSIS — K089 Disorder of teeth and supporting structures, unspecified: Secondary | ICD-10-CM | POA: Diagnosis not present

## 2017-07-27 DIAGNOSIS — L97509 Non-pressure chronic ulcer of other part of unspecified foot with unspecified severity: Secondary | ICD-10-CM | POA: Diagnosis not present

## 2017-07-27 DIAGNOSIS — I10 Essential (primary) hypertension: Secondary | ICD-10-CM | POA: Diagnosis not present

## 2017-07-27 NOTE — Patient Instructions (Addendum)
It was great seeing you again Marcus [name]! I am glad that your blood sugar control is doing well. For now I think we should keep your lantus dosing at 13 units and your metformin dosing the same. I start taking my lantus in the morning at 10am. Studies have shown that this increased blood sugar control and adherence. If you do choose to switch to a morning dose, please do not take your dose on the preceding day.  I filled out your paperwork for the handicap parking decal. Please continue taking your blood pressure medication as prescribed. Please continue to see your dentist about your tooth issues. Also please schedule an appointment with your eye doctor when able for an eye exam. I will see you back around 10/01/2017 for an A1C check.

## 2017-07-30 DIAGNOSIS — L97509 Non-pressure chronic ulcer of other part of unspecified foot with unspecified severity: Secondary | ICD-10-CM | POA: Insufficient documentation

## 2017-07-30 NOTE — Assessment & Plan Note (Signed)
Resolved since starting lantus. Will consider this is likely due to hyperglycemia and consider problem resolved.

## 2017-07-30 NOTE — Assessment & Plan Note (Signed)
Patient with small ulcer on left foot. Wishes to see podiatry for this. Placed this referral today.

## 2017-07-30 NOTE — Assessment & Plan Note (Signed)
140/90 at presentation. Even better 130/85 at recheck. Will continue lisinopril 40mg  daily. Will monitor at next appointment.

## 2017-07-30 NOTE — Progress Notes (Signed)
   HPI 42 year old who presents for follow up for his diabetes. He states that since starting the lantus he has been feeling much, much better. He has been taking his sugars in the morning and occasionally in the afternoon and night before meals. He states that his sugars have usually been between 110-200. Unfortunately he forgot his journal where he has been logging them. He is interested in dietary changes and losing weight. He has been taking his lantus at 5PM. Taking metformin 1g bid as prescribed.  Otherwise patient did see his dentist and had his left upper pre-molar pulled. There is a plan for further dental work although patient is unsure exactly what is to be done.  Patient blood pressure has been well controlled. He has been taking his lisinopril daily. Blood pressure 140/90 at presentation today.   CC: diabetes follow up   ROS:  Review of Systems  Constitutional: Negative for chills and fever.  HENT: Negative for sinus pain.   Respiratory: Negative for cough and hemoptysis.   Cardiovascular: Negative for chest pain.  Gastrointestinal: Negative for abdominal pain, constipation, diarrhea, nausea and vomiting.  Musculoskeletal: Negative for myalgias.  Neurological: Negative for dizziness and headaches.    Review of Systems See HPI for ROS.   CC, SH/smoking status, and VS noted  Objective: BP 140/90 (BP Location: Left Arm, Patient Position: Sitting, Cuff Size: Normal)   Pulse (!) 119   Temp 98.4 F (36.9 C) (Oral)   Wt 263 lb 9.6 oz (119.6 kg)   SpO2 97%   BMI 40.68 kg/m  Gen: NAD, alert, cooperative, and pleasant. Obese african [place] male. Resting comfortably HEENT: NCAT, EOMI, PERRL. Poor dentition, healing area in left top pre-molar s/p tooth pull CV: RRR, no murmur Resp: CTAB, no wheezes, non-labored Abd: SNTND, BS present, no guarding or organomegaly Ext: No edema, warm Neuro: Alert and oriented, Speech clear, No gross deficits   Assessment and  plan:  Dizziness Resolved since starting lantus. Will consider this is likely due to hyperglycemia and consider problem resolved.  Tooth pain Left upper premolar site looks to be healing well. S/P pulled tooth. Apparently his dentist is planning on doing more work in this area.  Poor dentition Managed by his dentist. Patient with more planned dental work. His pain has resolved, but it sounds like he will need a root canal.  Essential hypertension 140/90 at presentation. Even better 130/85 at recheck. Will continue lisinopril 40mg  daily. Will monitor at next appointment.  Type 2 diabetes mellitus with complication [facility](HCC) Patient with better control of his sugars per his report. States they are usually between 110s-200. Still taking his metformin as prescribed and lantus 13U. Given that he did not bring his logs with him will hold with 13 U for now. Did advise for him to switch his lantus dosing period to 10am as this will provide better coverage throughout the day when he is actually eating. Recheck A1C in late may.   No orders of the defined types were placed in this encounter.   No orders of the defined types were placed in this encounter.    Myrene BuddyJacob Carmine Youngberg MD PGY-1 Family Medicine Resident 07/30/2017 9:20 AM

## 2017-07-30 NOTE — Assessment & Plan Note (Signed)
Left upper premolar site looks to be healing well. S/P pulled tooth. Apparently his dentist is planning on doing more work in this area.

## 2017-07-30 NOTE — Assessment & Plan Note (Signed)
Patient with better control of his sugars per his report. States they are usually between 110s-200. Still taking his metformin as prescribed and lantus 13U. Given that he did not bring his logs with him will hold with 13 U for now. Did advise for him to switch his lantus dosing period to 10am as this will provide better coverage throughout the day when he is actually eating. Recheck A1C in late may.

## 2017-07-30 NOTE — Assessment & Plan Note (Signed)
Managed by his dentist. Patient with more planned dental work. His pain has resolved, but it sounds like he will need a root canal.

## 2017-08-03 ENCOUNTER — Encounter: Payer: Self-pay | Admitting: Family Medicine

## 2017-08-12 ENCOUNTER — Other Ambulatory Visit: Payer: Self-pay | Admitting: Family Medicine

## 2017-08-13 ENCOUNTER — Other Ambulatory Visit: Payer: Self-pay | Admitting: Family Medicine

## 2017-08-13 ENCOUNTER — Other Ambulatory Visit: Payer: Self-pay

## 2017-08-13 MED ORDER — OXYCODONE-ACETAMINOPHEN 7.5-325 MG PO TABS: 1.0000 | ORAL_TABLET | Freq: Four times a day (QID) | ORAL | 0 refills | Status: DC | PRN

## 2017-08-13 NOTE — Telephone Encounter (Signed)
Have refilled prescription for percocet and placed under folder marked "T". Please let patient know this is ready for him to pick up.  Myrene BuddyJacob Bronsyn Shappell MD PGY-1 Family Medicine Resident

## 2017-08-15 ENCOUNTER — Other Ambulatory Visit: Payer: Self-pay | Admitting: Podiatry

## 2017-08-15 ENCOUNTER — Ambulatory Visit (INDEPENDENT_AMBULATORY_CARE_PROVIDER_SITE_OTHER): Payer: Medicaid Other

## 2017-08-15 ENCOUNTER — Encounter: Payer: Self-pay | Admitting: Podiatry

## 2017-08-15 ENCOUNTER — Ambulatory Visit: Payer: Medicaid Other | Admitting: Podiatry

## 2017-08-15 DIAGNOSIS — M7752 Other enthesopathy of left foot: Secondary | ICD-10-CM

## 2017-08-15 DIAGNOSIS — M25572 Pain in left ankle and joints of left foot: Secondary | ICD-10-CM

## 2017-08-15 DIAGNOSIS — E1140 Type 2 diabetes mellitus with diabetic neuropathy, unspecified: Secondary | ICD-10-CM

## 2017-08-15 DIAGNOSIS — E1149 Type 2 diabetes mellitus with other diabetic neurological complication: Secondary | ICD-10-CM

## 2017-08-15 DIAGNOSIS — M779 Enthesopathy, unspecified: Secondary | ICD-10-CM

## 2017-08-15 DIAGNOSIS — M79676 Pain in unspecified toe(s): Secondary | ICD-10-CM

## 2017-08-15 DIAGNOSIS — B351 Tinea unguium: Secondary | ICD-10-CM

## 2017-08-15 NOTE — Progress Notes (Signed)
Subjective:   Patient ID: Marcus [name], male   DOB: 42 y.o.   MRN: [id number]   HPI Patient presents with ankle pain right also complains about nailbeds 1-5 both feet that he states get tender and he has diabetes and he cannot cut himself   ROS      Objective:  Physical Exam  Neurovascular status intact with inflammation pain of the right ankle with mild restriction of motion and incurvated nailbeds 1-5 both feet that are thick yellow brittle and become sore for him along with long-term diabetes with neurological     Assessment:  Inflammatory irritation ankle with mycotic nail infection 1-5 both feet with pain with at risk factors consisting of diabetic neuropathy     Plan:  H&P diabetic education rendered to patient concerning daily inspections of his feet and debrided nailbeds 1-5 both feet.  Discussed ankle and recommended supportive shoes and patient will be seen back for routine care or earlier if needed

## 2017-09-03 ENCOUNTER — Other Ambulatory Visit: Payer: Self-pay | Admitting: Family Medicine

## 2017-09-03 ENCOUNTER — Other Ambulatory Visit: Payer: Self-pay

## 2017-09-03 MED ORDER — ACCU-CHEK FASTCLIX LANCET KIT: 1.0000 [IU] | PACK | 2 refills | Status: DC | PRN

## 2017-09-03 NOTE — Telephone Encounter (Signed)
Sent in a script for a kit of the fast clix with refills. Please let patient know this has been sent in.  Guadalupe Dawn MD PGY-1 Family Medicine Resident

## 2017-09-03 NOTE — Progress Notes (Signed)
Encounter created in error  Myrene BuddyJacob Fotini Lemus MD PGY-1 [facility]

## 2017-09-04 NOTE — Telephone Encounter (Signed)
Notified patient of RX being called in. He says he really appreciates it.Marcus [name].Marcus [name], Michelle R, CMA

## 2017-09-23 ENCOUNTER — Other Ambulatory Visit: Payer: Self-pay

## 2017-09-23 ENCOUNTER — Encounter (HOSPITAL_COMMUNITY): Payer: Self-pay | Admitting: Emergency Medicine

## 2017-09-23 ENCOUNTER — Emergency Department (HOSPITAL_COMMUNITY)
Admission: EM | Admit: 2017-09-23 | Discharge: 2017-09-23 | Disposition: A | Discharge status: Home / Self Care | Payer: Medicaid Other | Attending: Emergency Medicine | Admitting: Emergency Medicine

## 2017-09-23 DIAGNOSIS — E119 Type 2 diabetes mellitus without complications: Secondary | ICD-10-CM | POA: Insufficient documentation

## 2017-09-23 DIAGNOSIS — Z7982 Long term (current) use of aspirin: Secondary | ICD-10-CM | POA: Insufficient documentation

## 2017-09-23 DIAGNOSIS — Z79899 Other long term (current) drug therapy: Secondary | ICD-10-CM | POA: Diagnosis not present

## 2017-09-23 DIAGNOSIS — K0889 Other specified disorders of teeth and supporting structures: Secondary | ICD-10-CM | POA: Insufficient documentation

## 2017-09-23 DIAGNOSIS — I10 Essential (primary) hypertension: Secondary | ICD-10-CM | POA: Insufficient documentation

## 2017-09-23 DIAGNOSIS — Z794 Long term (current) use of insulin: Secondary | ICD-10-CM | POA: Diagnosis not present

## 2017-09-23 MED ORDER — NAPROXEN 250 MG PO TABS
375.0000 mg | ORAL_TABLET | Freq: Once | ORAL | Status: AC
  Administered 2017-09-23: 375 mg via ORAL
  Filled 2017-09-23: qty 2

## 2017-09-23 MED ORDER — PENICILLIN V POTASSIUM 500 MG PO TABS: 500.0000 mg | ORAL_TABLET | Freq: Four times a day (QID) | ORAL | 0 refills | Status: AC

## 2017-09-23 NOTE — Discharge Instructions (Signed)
Take antibiotics as directed. Please take all of your antibiotics until finished.  You can take Tylenol or Ibuprofen as directed for pain. You can alternate Tylenol and Ibuprofen every 4 hours. If you take Tylenol at 1pm, then you can take Ibuprofen at 5pm. Then you can take Tylenol again at 9pm.   The exam and treatment you received today has been provided on an emergency basis only. This is not a substitute for complete medical or dental care. If your problem worsens or new symptoms (problems) appear, and you are unable to arrange prompt follow-up care with your dentist, call or return to this location. If you do not have a dentist, please follow-up with one on the list provided  CALL YOUR DENTIST OR RETURN IMMEDIATELY IF you develop a fever, rash, difficulty breathing or swallowing, neck or facial swelling, or other potentially serious concerns.   Please follow-up with one of the dental clinics provided to you below or in your paperwork. Call and tell them you were seen in the Emergency Dept and arrange for an appointment. You may have to call multiple places in order to find a place to be seen.  Dental Assistance If the dentist on-call cannot see you, please use the resources below:   Patients with Medicaid: [facility] [address], [phone], [phone]  If unable to pay, or uninsured, contact [facility] ([phone]) or [facility] ([phone] in [place], [phone] in [place]) to become qualified for the adult dental clinic  Other Low-Cost Community Dental Services: [facility]- [address], [place], [place], [address]    [phone], Ext. [phone]    2nd and 4th Thursday of the month at 6:30am    10 clients each day by appointment, can sometimes see walk-in     patients if someone does not show for an appointment [facility]- [address], [place], [place], [address]    [phone]  [facility]- [address], [place], [place], [address]    [phone]  [facility]- [phone] [facility]- [phone] [facility]- [phone]  
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
## 2017-09-23 NOTE — ED Triage Notes (Signed)
Patient to ED c/o L upper and lower dental pain since last night - patient states he knows he needs work done, but pain has come and gone and he's been putting it off. Denies fevers/chills, no noticeable swelling.

## 2017-10-12 ENCOUNTER — Other Ambulatory Visit: Payer: Self-pay | Admitting: Family Medicine

## 2017-10-12 ENCOUNTER — Encounter: Payer: Self-pay | Admitting: Family Medicine

## 2017-10-13 ENCOUNTER — Other Ambulatory Visit: Payer: Self-pay | Admitting: Family Medicine

## 2017-10-16 ENCOUNTER — Telehealth: Payer: Self-pay

## 2017-10-16 NOTE — Telephone Encounter (Signed)
Called patient and informed him that his RX had been called into pharmacy. Told him that if he still had problems to give us a call back. He is concerned that what is at the pharmacy may not be what he needs. Says that he needs a specific type of needle. Assured him that I am pretty certain that the right RX was called in.  By Dr. Primitivo GauzeFletcher.  Glennie Hawk.Darrin Apodaca R, CMA

## 2017-11-07 ENCOUNTER — Encounter: Payer: Self-pay | Admitting: Family Medicine

## 2017-11-07 ENCOUNTER — Ambulatory Visit (INDEPENDENT_AMBULATORY_CARE_PROVIDER_SITE_OTHER): Payer: Medicaid Other | Admitting: Family Medicine

## 2017-11-07 ENCOUNTER — Other Ambulatory Visit: Payer: Self-pay

## 2017-11-07 VITALS — BP 150/92 | HR 92 | Temp 98.5°F | Wt 268.2 lb

## 2017-11-07 DIAGNOSIS — K089 Disorder of teeth and supporting structures, unspecified: Secondary | ICD-10-CM

## 2017-11-07 DIAGNOSIS — E118 Type 2 diabetes mellitus with unspecified complications: Secondary | ICD-10-CM | POA: Diagnosis not present

## 2017-11-07 LAB — POCT GLYCOSYLATED HEMOGLOBIN (HGB A1C): HbA1c, POC (controlled diabetic range): 11.1 % — AB (ref 0.0–7.0)

## 2017-11-07 MED ORDER — OXYCODONE-ACETAMINOPHEN 7.5-325 MG PO TABS: 1.0000 | ORAL_TABLET | Freq: Four times a day (QID) | ORAL | 0 refills | Status: DC | PRN

## 2017-11-07 MED ORDER — INSULIN GLARGINE 100 UNITS/ML SOLOSTAR PEN: 20.0000 [IU] | PEN_INJECTOR | Freq: Every day | SUBCUTANEOUS | 0 refills | Status: DC

## 2017-11-07 MED ORDER — LIRAGLUTIDE 18 MG/3ML ~~LOC~~ SOPN: 0.6000 mg | PEN_INJECTOR | Freq: Every day | SUBCUTANEOUS | 0 refills | Status: DC

## 2017-11-07 NOTE — Patient Instructions (Addendum)
It was great seeing you today [name]! I am glad that things are going well. Your hemoglobin a1c has improved to 11.1 from 14.8. Your goal is probably closer to 6.5-7 so we still have some work to do. That being said I am very pleased with how we have been doing. I would like to increase your lantus dose to 20Units. Continue your metformin 1g bid. I would also like to add on a new medication called Victoza.  Regarding your lifestyle changes I am glad that you have a good support group. Exercise and diet will be critical in getting you healthy and will help immensely in getting your diabetes under control. I have given you the card for our nutritionist, dr. Gerilyn PilgrimSykes. Please call her to schedule an appointment if you are interested in seeing her.  Please go see your dentist for your tooth pain. I have given you a very temporary supply of pain meds. Only take these if you need them. I would continue the tea tree oil and tylenol which have been working pretty well. They can likely point you in the direction of a cosmetic dentist. I am happy to assist you in any way.

## 2017-11-14 ENCOUNTER — Encounter: Payer: Self-pay | Admitting: Family Medicine

## 2017-11-14 ENCOUNTER — Ambulatory Visit: Payer: Medicaid Other | Admitting: Podiatry

## 2017-11-14 NOTE — Assessment & Plan Note (Signed)
Estimated average glucose is ~300 with an a1c of 11.1. He has not had any lows but unfortunately did not bring his log book. Will increase him to 20U lantus. Will add on victoza 0.6mg . Plan to increase at next visit. Follow up in 3 months for a1c.

## 2017-11-14 NOTE — Progress Notes (Signed)
   HPI 42 year old male who presents for a1c check. It has improved from 11.1 to 14.8. He has been still eating a good amount of fast food and has not been exercising much. He does have good family support and he is going to start a workout regimen with them after July 4th. He is interested in seeing Dr. Gerilyn PilgrimSykes.  Patient suffers from poor dentition. He is now having pain in the area of his front left teeth. He already has dental follow up scheduled.  CC: a1c follow up   ROS:  Review of Systems See HPI for ROS.   CC, SH/smoking status, and VS noted  Objective: BP (!) 150/92   Pulse 92   Temp 98.5 F (36.9 C) (Oral)   Wt 268 lb 3.2 oz (121.7 kg)   SpO2 93%   BMI 41.39 kg/m  Gen: NAD, alert, cooperative, and pleasant.Obese AA male in no acute distress HEENT: NCAT, EOMI, PERRL. Poor dentition, exquisetly painful front left teeth. CV: RRR, no murmur Resp: CTAB, no wheezes, non-labored Abd: SNTND, BS present, no guarding or organomegaly Ext: No edema, warm Neuro: Alert and oriented, Speech clear, No gross deficits   Assessment and plan:  Type 2 diabetes mellitus with complication (HCC) Estimated average glucose is ~300 with an a1c of 11.1. He has not had any lows but unfortunately did not bring his log book. Will increase him to 20U lantus. Will add on victoza 0.6mg . Plan to increase at next visit. Follow up in 3 months for a1c.  Poor dentition Gave patient very temporary supply of pain meds, as needed until he can see dentist. He is to follow up with his dentist.   Orders Placed This Encounter  Procedures  . HgB A1c    Meds ordered this encounter  Medications  . liraglutide (VICTOZA) 18 MG/3ML SOPN    Sig: Inject 0.1 mLs (0.6 mg total) into the skin daily.    Dispense:  1 pen    Refill:  0  . oxyCODONE-acetaminophen (PERCOCET) 7.5-325 MG tablet    Sig: Take 1 tablet by mouth every 6 (six) hours as needed.    Dispense:  14 tablet    Refill:  0  . insulin glargine  (LANTUS) 100 unit/mL SOPN    Sig: Inject 0.2 mLs (20 Units total) into the skin daily.    Dispense:  15 mL    Refill:  0     Myrene BuddyJacob Mariely Mahr MD PGY-1 Family Medicine Resident  11/14/2017 8:14 AM

## 2017-11-14 NOTE — Assessment & Plan Note (Signed)
Gave patient very temporary supply of pain meds, as needed until he can see dentist. He is to follow up with his dentist.

## 2017-12-20 ENCOUNTER — Other Ambulatory Visit: Payer: Self-pay | Admitting: Family Medicine

## 2017-12-20 ENCOUNTER — Other Ambulatory Visit: Payer: Self-pay | Admitting: *Deleted

## 2017-12-20 MED ORDER — LIRAGLUTIDE 18 MG/3ML ~~LOC~~ SOPN: 0.6000 mg | PEN_INJECTOR | Freq: Every day | SUBCUTANEOUS | 1 refills | Status: DC

## 2017-12-20 MED ORDER — LIRAGLUTIDE 18 MG/3ML ~~LOC~~ SOPN: 0.6000 mg | PEN_INJECTOR | Freq: Every day | SUBCUTANEOUS | 0 refills | Status: DC

## 2017-12-21 ENCOUNTER — Encounter: Payer: Self-pay | Admitting: Podiatry

## 2017-12-21 ENCOUNTER — Ambulatory Visit: Payer: Medicaid Other | Admitting: Podiatry

## 2017-12-21 DIAGNOSIS — E1140 Type 2 diabetes mellitus with diabetic neuropathy, unspecified: Secondary | ICD-10-CM | POA: Diagnosis not present

## 2017-12-21 DIAGNOSIS — E1149 Type 2 diabetes mellitus with other diabetic neurological complication: Secondary | ICD-10-CM

## 2017-12-21 DIAGNOSIS — B351 Tinea unguium: Secondary | ICD-10-CM | POA: Diagnosis not present

## 2017-12-21 DIAGNOSIS — M79676 Pain in unspecified toe(s): Secondary | ICD-10-CM | POA: Diagnosis not present

## 2017-12-21 NOTE — Progress Notes (Signed)
Complaint:  Visit Type: Patient returns to my office for continued preventative foot care services. Complaint: Patient states" my nails have grown long and thick and become painful to walk and wear shoes" Patient has been diagnosed with DM with no foot complications. The patient presents for preventative foot care services. No changes to ROS  Podiatric Exam: Vascular: dorsalis pedis and posterior tibial pulses are palpable bilateral. Capillary return is immediate. Temperature gradient is WNL. Skin turgor WNL  Sensorium: Normal Semmes Weinstein monofilament test. Normal tactile sensation bilaterally. Nail Exam: Pt has thick disfigured discolored nails with subungual debris noted bilateral entire nail hallux through fifth toenails Ulcer Exam: There is no evidence of ulcer or pre-ulcerative changes or infection. Orthopedic Exam: Muscle tone and strength are WNL. No limitations in general ROM. No crepitus or effusions noted. Foot type and digits show no abnormalities. Bony prominences are unremarkable. Skin: No Porokeratosis. No infection or ulcers  Diagnosis:  Onychomycosis, , Pain in right toe, pain in left toes  Treatment & Plan Procedures and Treatment: Consent by patient was obtained for treatment procedures.   Debridement of mycotic and hypertrophic toenails, 1 through 5 bilateral and clearing of subungual debris. No ulceration, no infection noted.  Return Visit-Office Procedure: Patient instructed to return to the office for a follow up visit 3 months for continued evaluation and treatment.    Johnnisha Forton DPM 

## 2018-01-16 ENCOUNTER — Other Ambulatory Visit: Payer: Self-pay | Admitting: Family Medicine

## 2018-01-20 ENCOUNTER — Other Ambulatory Visit: Payer: Self-pay | Admitting: Family Medicine

## 2018-02-02 ENCOUNTER — Encounter: Payer: Self-pay | Admitting: Family Medicine

## 2018-02-06 ENCOUNTER — Telehealth: Payer: Self-pay | Admitting: Family Medicine

## 2018-02-06 NOTE — Telephone Encounter (Signed)
Pt wife called and scheduled pt appointment for his medication refills. The next available appointment is not until 03/18/18. Pt will be out of his blood pressure medicine before the appointment and would like to know if Dr. Primitivo Gauze could send in enough medication to last until his appointment.

## 2018-02-08 ENCOUNTER — Other Ambulatory Visit: Payer: Self-pay | Admitting: Family Medicine

## 2018-02-08 MED ORDER — LISINOPRIL 40 MG PO TABS: 40.0000 mg | ORAL_TABLET | Freq: Every day | ORAL | 0 refills | Status: DC

## 2018-02-08 MED ORDER — AMLODIPINE BESYLATE 10 MG PO TABS: 10.0000 mg | ORAL_TABLET | Freq: Every day | ORAL | 0 refills | Status: DC

## 2018-02-08 NOTE — Progress Notes (Signed)
Filled patient's htn medications (norvasc and lisinopril) until patient can come to appointment scheduled 03/18/18.

## 2018-02-08 NOTE — Telephone Encounter (Signed)
Filled patient's htn medications (norvasc and lisinopril) until patient can come to appointment scheduled 03/18/18.  

## 2018-02-14 ENCOUNTER — Other Ambulatory Visit: Payer: Self-pay | Admitting: Family Medicine

## 2018-02-22 ENCOUNTER — Ambulatory Visit (INDEPENDENT_AMBULATORY_CARE_PROVIDER_SITE_OTHER): Payer: Medicaid Other | Admitting: Family Medicine

## 2018-02-22 ENCOUNTER — Encounter: Payer: Self-pay | Admitting: Family Medicine

## 2018-02-22 VITALS — BP 122/80 | HR 99 | Temp 98.6°F | Wt 266.8 lb

## 2018-02-22 DIAGNOSIS — J069 Acute upper respiratory infection, unspecified: Secondary | ICD-10-CM | POA: Diagnosis not present

## 2018-02-22 DIAGNOSIS — B9789 Other viral agents as the cause of diseases classified elsewhere: Secondary | ICD-10-CM | POA: Diagnosis not present

## 2018-02-22 MED ORDER — BENZONATATE 100 MG PO CAPS: 100.0000 mg | ORAL_CAPSULE | Freq: Three times a day (TID) | ORAL | 0 refills | Status: DC | PRN

## 2018-02-22 NOTE — Patient Instructions (Signed)
It was very nice to meet you today.  You were seen in clinic for a cough.  As we discussed, this is most likely a viral upper respiratory tract infection and does not need antibiotics.  I would recommend continuing good fluid intake and drinking Gatorade, soups and broths as well as water.  You may use an over-the-counter cough medication such as Robitussin.  Additionally, I have prescribed you Tessalon Perles for cough.  Follow-up if symptoms worsen or do not improve.  Please call clinic if you have any questions.  Freddrick March MD     Viral Respiratory Infection A viral respiratory infection is an illness that affects parts of the body used for breathing, like the lungs, nose, and throat. It is caused by a germ called a virus. Some examples of this kind of infection are:  A cold.  The flu (influenza).  A respiratory syncytial virus (RSV) infection.  How do I know if I have this infection? Most of the time this infection causes:  A stuffy or runny nose.  Yellow or green fluid in the nose.  A cough.  Sneezing.  Tiredness (fatigue).  Achy muscles.  A sore throat.  Sweating or chills.  A fever.  A headache.  How is this infection treated? If the flu is diagnosed early, it may be treated with an antiviral medicine. This medicine shortens the length of time a person has symptoms. Symptoms may be treated with over-the-counter and prescription medicines, such as:  Expectorants. These make it easier to cough up mucus.  Decongestant nasal sprays.  Doctors do not prescribe antibiotic medicines for viral infections. They do not work with this kind of infection. How do I know if I should stay home? To keep others from getting sick, stay home if you have:  A fever.  A lasting cough.  A sore throat.  A runny nose.  Sneezing.  Muscles aches.  Headaches.  Tiredness.  Weakness.  Chills.  Sweating.  An upset stomach (nausea).  Follow these instructions at  home:  Rest as much as possible.  Take over-the-counter and prescription medicines only as told by your doctor.  Drink enough fluid to keep your pee (urine) clear or pale yellow.  Gargle with salt water. Do this 3-4 times per day or as needed. To make a salt-water mixture, dissolve -1 tsp of salt in 1 cup of warm water. Make sure the salt dissolves all the way.  Use nose drops made from salt water. This helps with stuffiness (congestion). It also helps soften the skin around your nose.  Do not drink alcohol.  Do not use tobacco products, including cigarettes, chewing tobacco, and e-cigarettes. If you need help quitting, ask your doctor. Get help if:  Your symptoms last for 10 days or longer.  Your symptoms get worse over time.  You have a fever.  You have very bad pain in your face or forehead.  Parts of your jaw or neck become very swollen. Get help right away if:  You feel pain or pressure in your chest.  You have shortness of breath.  You faint or feel like you will faint.  You keep throwing up (vomiting).  You feel confused. This information is not intended to replace advice given to you by your health care provider. Make sure you discuss any questions you have with your health care provider. Document Released: 04/13/2008 Document Revised: 10/07/2015 Document Reviewed: 10/07/2014 Elsevier Interactive Patient Education  2018 [employer].

## 2018-02-22 NOTE — Progress Notes (Signed)
   Subjective:   Patient ID: Marcus [name]    DOB: [birth year], 42 y.o. male   MRN: [id number]  CC: cough   HPI: Marcus [name] is a 42 y.o. male who presents to clinic today for the following issue.  Cough Noticed it about 1 week ago, wife is concerned with how hard he is coughing.  Productive of white-yellow sputum.  No throat pain but feels scratchy, no fever, chills.  No nausea, vomiting. +Congestion but no runny nose or itchy watery eyes.  Has been eating and drinking normally. Appetite is a little down but he is drinking gatorade and water.  He has not taken any medications for the cough.  Has been taking lisinopril for 6-7 years. Wife is sick with similar symptoms.  He did not have his flu shot and declines this.    ROS: No fever, chills, nausea, vomiting, shortness of breath.  +cough, congestion.  Social: She is a never smoker Medications reviewed. Objective:   BP 122/80   Pulse 99   Temp 98.6 F (37 C) (Oral)   Wt 266 lb 12.8 oz (121 kg)   SpO2 93%   BMI 41.17 kg/m  Vitals and nursing note reviewed.  General: Well-appearing 42 year old male, NAD HEENT: NCAT, EOMI, PERRL, MMM, mild rhinorrhea, oropharynx clear without tonsillar exudate or erythema Neck: supple, non-tender, normal ROM, no LAD  CV: RRR no MRG  Lungs: CTAB, normal effort, no wheeze Abdomen: soft, NTND, +bs  Skin: warm, dry, no rash Extremities: warm and well perfused, normal tone Neuro: alert, oriented x3, no focal deficits   Assessment & Plan:   Viral upper respiratory tract infection Likely etiology viral, discussed no role for antibiotics.  Exam unremarkable and without evidence of Strep.  Advised fluids and hydration Anticipation recovery within 7-10 days  OTC cough medication ie Robitussin Rx: tessalon pearles -follow up if symptoms worsen or do not improve   Meds ordered this encounter  Medications  . benzonatate (TESSALON PERLES) 100 MG capsule    Sig: Take 1 capsule (100 mg total) by  mouth 3 (three) times daily as needed for cough.    Dispense:  40 capsule    Refill:  0   Follow-up: If symptoms worsen or do not improve  Freddrick March, MD [facility] PGY-3

## 2018-02-27 ENCOUNTER — Telehealth: Payer: Self-pay | Admitting: Family Medicine

## 2018-02-27 ENCOUNTER — Encounter: Payer: Self-pay | Admitting: Family Medicine

## 2018-02-27 NOTE — Telephone Encounter (Signed)
disbility parking placard & dental medical clearance form dropped off for at front desk for completion.  Verified that patient section of form has been completed.  Last DOS/WCC with PCP was 11/10/17.  Placed form in  Red team folder to be completed by clinical staff.  Lina Sar

## 2018-02-27 NOTE — Telephone Encounter (Signed)
Placed in MDs box to be filled out. Deseree Blount, CMA  

## 2018-03-06 ENCOUNTER — Other Ambulatory Visit: Payer: Self-pay | Admitting: Family Medicine

## 2018-03-06 NOTE — Telephone Encounter (Signed)
Pt is calling to check on the status of his forms being completed by Dr. Primitivo Gauze. Pt would like for someone to call him when they are compete and ready to be picked up. The best contact number is [phone].

## 2018-03-08 ENCOUNTER — Telehealth: Payer: Self-pay | Admitting: Family Medicine

## 2018-03-08 NOTE — Telephone Encounter (Signed)
Dental form was faxed already.  Made copy and placed in batch scanning.   Pt informed that Parking placard was ready for pick up.  Copy placed in batch scanning. Fleeger, Maryjo Rochester, CMA

## 2018-03-08 NOTE — Telephone Encounter (Signed)
Upon review there is only a parking placard. No form for dental clearance.  Placed back in providers box until other form is attached. Fleeger, Maryjo Rochester, CMA

## 2018-03-08 NOTE — Telephone Encounter (Signed)
Form completed and left in nurses box  Myrene Buddy MD PGY-2 Family Medicine Resident

## 2018-03-18 ENCOUNTER — Ambulatory Visit (INDEPENDENT_AMBULATORY_CARE_PROVIDER_SITE_OTHER): Payer: Medicaid Other | Admitting: Family Medicine

## 2018-03-18 ENCOUNTER — Encounter: Payer: Self-pay | Admitting: Family Medicine

## 2018-03-18 ENCOUNTER — Other Ambulatory Visit: Payer: Self-pay

## 2018-03-18 VITALS — BP 118/70 | HR 126 | Temp 98.0°F | Ht 67.5 in | Wt 274.8 lb

## 2018-03-18 DIAGNOSIS — I10 Essential (primary) hypertension: Secondary | ICD-10-CM | POA: Diagnosis not present

## 2018-03-18 DIAGNOSIS — E118 Type 2 diabetes mellitus with unspecified complications: Secondary | ICD-10-CM

## 2018-03-18 DIAGNOSIS — K089 Disorder of teeth and supporting structures, unspecified: Secondary | ICD-10-CM | POA: Diagnosis not present

## 2018-03-18 LAB — POCT GLYCOSYLATED HEMOGLOBIN (HGB A1C): HbA1c, POC (controlled diabetic range): 12.2 % — AB (ref 0.0–7.0)

## 2018-03-18 MED ORDER — HYDROCODONE-ACETAMINOPHEN 5-325 MG PO TABS: 1.0000 | ORAL_TABLET | Freq: Four times a day (QID) | ORAL | 0 refills | Status: DC | PRN

## 2018-03-18 MED ORDER — LIRAGLUTIDE 18 MG/3ML ~~LOC~~ SOPN: 1.2000 mg | PEN_INJECTOR | Freq: Every day | SUBCUTANEOUS | 1 refills | Status: DC

## 2018-03-18 MED ORDER — INSULIN GLARGINE 100 UNIT/ML SOLOSTAR PEN: PEN_INJECTOR | SUBCUTANEOUS | 0 refills | Status: DC

## 2018-03-18 MED ORDER — TRAMADOL HCL 50 MG PO TABS: 50.0000 mg | ORAL_TABLET | Freq: Three times a day (TID) | ORAL | 0 refills | Status: DC | PRN

## 2018-03-18 NOTE — Patient Instructions (Addendum)
It was great seeing you again today! I am sorry that you have been having so much trouble with your teeth. I am glad that you finally have a date to get this taken care of.   For your pain control I am giving you two medications For 1-3 pain I would take ibuprofen 400mg  For 4-6 pain I would take 1 tramadol 50mg  tablet For 7-10 pain I would take 1 norco tablet  WE discussed ways to help improve your a1c. Your lantus should be increased to 26U daily. Please also increase your victoza to 1.24mL daily.

## 2018-03-21 ENCOUNTER — Encounter: Payer: Self-pay | Admitting: Family Medicine

## 2018-03-21 NOTE — Assessment & Plan Note (Signed)
Has appointment with dentist in December.  Will give supply of both tramadol and Norco to help him last until he is able to get his dental work done.Jovita Gamma extensive directions on how to use his pain meds.  He is to use tramadol for 4-7 pain, and Norco for 7-10.  While it is very concerning he took so much ibuprofen, okay to take 200 mg every 6 hours for 1-3 pain.

## 2018-03-21 NOTE — Assessment & Plan Note (Signed)
Hemoglobin A1c 12.2 from 11.1.  Patient has been very poor his diet.  Also has not been exercising.  Will increase Lantus to 26 units daily, increase Victoza to 1.2 mg daily.  Will need to follow-up in 3 months for A1c check.  Continue metformin 1 g twice daily. -Metformin 1 g twice daily -Lantus 26 units daily -Victoza 1.2 mg daily -Extensive counseling on diet and exercise

## 2018-03-21 NOTE — Assessment & Plan Note (Signed)
Normotensive.  BP 120s over 80s.  Continue amlodipine 10 mg, lisinopril 40 mg

## 2018-03-21 NOTE — Progress Notes (Signed)
HPI 42 year old male who presents for diabetes follow up and tooth pain.  Patient's a1c increased from 11.1 to 12.2. He states that he has been using his medications as prescribed. He does state that he has been drinking "several" glasses of soda and sweet tea per day. He has not been exercising very well.  Patient also presents with tooth pain.  He states that a couple of days ago he had to 10 out of 10 he can barely stand it.  He followed by going to the emergency department but decided to wait until his appointment with me.  Tried to manage his pain by taking "12 ibuprofen".  He has not had Norco or oxycodone in the recent past it has on his list.  He does have appointment with his dentist to pull his teeth in December.  He is requesting medications to help him make it to that time.  CC: a1c check and tooth pain   ROS:   Review of Systems See HPI for ROS.   CC, SH/smoking status, and VS noted  Objective: BP 118/70   Pulse (!) 126   Temp 98 F (36.7 C) (Oral)   Ht 5' 7.5" (1.715 m)   Wt 274 lb 12.8 oz (124.6 kg)   SpO2 99%   BMI 42.40 kg/m  Gen: Pleasant 42 year old African-American male.  Resting comfortably.  No acute distress HEENT: Very poor dentition.  Multiple chipped and broken teeth on both upper and lower jaw. CV: RRR, no murmur Resp: CTAB, no wheezes, non-labored Abd: SNTND, BS present, no guarding or organomegaly Ext: No edema, warm Neuro: Alert and oriented, Speech clear, No gross deficits   Assessment and plan:  Poor dentition Has appointment with dentist in December.  Will give supply of both tramadol and Norco to help him last until he is able to get his dental work done.Jovita Gamma extensive directions on how to use his pain meds.  He is to use tramadol for 4-7 pain, and Norco for 7-10.  While it is very concerning he took so much ibuprofen, okay to take 200 mg every 6 hours for 1-3 pain.  Type 2 diabetes mellitus with complication (HCC) Hemoglobin A1c 12.2  from 11.1.  Patient has been very poor his diet.  Also has not been exercising.  Will increase Lantus to 26 units daily, increase Victoza to 1.2 mg daily.  Will need to follow-up in 3 months for A1c check.  Continue metformin 1 g twice daily. -Metformin 1 g twice daily -Lantus 26 units daily -Victoza 1.2 mg daily -Extensive counseling on diet and exercise  Essential hypertension Normotensive.  BP 120s over 80s.  Continue amlodipine 10 mg, lisinopril 40 mg   Orders Placed This Encounter  Procedures  . HgB A1c    Meds ordered this encounter  Medications  . DISCONTD: Insulin Glargine (LANTUS SOLOSTAR) 100 UNIT/ML Solostar Pen    Sig: INJECT 26 UNITS INTO THE SKIN EVERY DAY    Dispense:  5 pen    Refill:  0  . liraglutide (VICTOZA) 18 MG/3ML SOPN    Sig: Inject 0.2 mLs (1.2 mg total) into the skin daily.    Dispense:  1 pen    Refill:  1  . HYDROcodone-acetaminophen (NORCO/VICODIN) 5-325 MG tablet    Sig: Take 1 tablet by mouth every 6 (six) hours as needed for severe pain (Only take for severe pain (7-10 on pain scale)).    Dispense:  20 tablet  Refill:  0  . traMADol (ULTRAM) 50 MG tablet    Sig: Take 1 tablet (50 mg total) by mouth every 8 (eight) hours as needed for moderate pain (Only take for moderate pain (4-6 on pain scale)).    Dispense:  20 tablet    Refill:  0  . Insulin Glargine (LANTUS SOLOSTAR) 100 UNIT/ML Solostar Pen    Sig: INJECT 26 UNITS INTO THE SKIN EVERY DAY    Dispense:  5 pen    Refill:  0   Myrene Buddy MD PGY-2 Family Medicine Resident  03/21/2018 1:52 PM

## 2018-04-19 ENCOUNTER — Encounter: Payer: Self-pay | Admitting: Podiatry

## 2018-04-19 ENCOUNTER — Ambulatory Visit: Payer: Medicaid Other | Admitting: Podiatry

## 2018-04-19 DIAGNOSIS — E1149 Type 2 diabetes mellitus with other diabetic neurological complication: Secondary | ICD-10-CM

## 2018-04-19 DIAGNOSIS — B351 Tinea unguium: Secondary | ICD-10-CM | POA: Diagnosis not present

## 2018-04-19 DIAGNOSIS — E1140 Type 2 diabetes mellitus with diabetic neuropathy, unspecified: Secondary | ICD-10-CM

## 2018-04-19 DIAGNOSIS — M79676 Pain in unspecified toe(s): Secondary | ICD-10-CM | POA: Diagnosis not present

## 2018-04-19 NOTE — Progress Notes (Signed)
Complaint:  Visit Type: Patient returns to my office for continued preventative foot care services. Complaint: Patient states" my nails have grown long and thick and become painful to walk and wear shoes" Patient has been diagnosed with DM with no foot complications. The patient presents for preventative foot care services. No changes to ROS  Podiatric Exam: Vascular: dorsalis pedis and posterior tibial pulses are palpable bilateral. Capillary return is immediate. Temperature gradient is WNL. Skin turgor WNL  Sensorium: Normal Semmes Weinstein monofilament test. Normal tactile sensation bilaterally. Nail Exam: Pt has thick disfigured discolored nails with subungual debris noted bilateral entire nail hallux through fifth toenails Ulcer Exam: There is no evidence of ulcer or pre-ulcerative changes or infection. Orthopedic Exam: Muscle tone and strength are WNL. No limitations in general ROM. No crepitus or effusions noted. Foot type and digits show no abnormalities. Bony prominences are unremarkable. Skin: No Porokeratosis. No infection or ulcers  Diagnosis:  Onychomycosis, , Pain in right toe, pain in left toes  Treatment & Plan Procedures and Treatment: Consent by patient was obtained for treatment procedures.   Debridement of mycotic and hypertrophic toenails, 1 through 5 bilateral and clearing of subungual debris. No ulceration, no infection noted.  Return Visit-Office Procedure: Patient instructed to return to the office for a follow up visit 3 months for continued evaluation and treatment.    Nina Hoar DPM 

## 2018-05-02 ENCOUNTER — Other Ambulatory Visit: Payer: Self-pay | Admitting: Family Medicine

## 2018-05-02 ENCOUNTER — Encounter: Payer: Self-pay | Admitting: Family Medicine

## 2018-05-02 DIAGNOSIS — E119 Type 2 diabetes mellitus without complications: Secondary | ICD-10-CM

## 2018-05-02 DIAGNOSIS — Z794 Long term (current) use of insulin: Secondary | ICD-10-CM

## 2018-05-03 ENCOUNTER — Other Ambulatory Visit: Payer: Self-pay | Admitting: Family Medicine

## 2018-05-03 ENCOUNTER — Encounter: Payer: Self-pay | Admitting: Family Medicine

## 2018-05-03 MED ORDER — AMLODIPINE BESYLATE 10 MG PO TABS: 10.0000 mg | ORAL_TABLET | Freq: Every day | ORAL | 0 refills | Status: DC

## 2018-05-03 NOTE — Progress Notes (Signed)
Sent script for amlodipine into [facility] on [address].

## 2018-06-12 ENCOUNTER — Other Ambulatory Visit: Payer: Self-pay | Admitting: Family Medicine

## 2018-06-14 ENCOUNTER — Encounter: Payer: Self-pay | Admitting: Family Medicine

## 2018-06-14 ENCOUNTER — Ambulatory Visit (INDEPENDENT_AMBULATORY_CARE_PROVIDER_SITE_OTHER): Payer: Medicaid Other | Admitting: Family Medicine

## 2018-06-14 ENCOUNTER — Other Ambulatory Visit: Payer: Self-pay

## 2018-06-14 VITALS — BP 114/72 | HR 117 | Temp 98.9°F | Ht 67.5 in | Wt 264.4 lb

## 2018-06-14 DIAGNOSIS — K047 Periapical abscess without sinus: Secondary | ICD-10-CM

## 2018-06-14 DIAGNOSIS — K089 Disorder of teeth and supporting structures, unspecified: Secondary | ICD-10-CM

## 2018-06-14 DIAGNOSIS — I10 Essential (primary) hypertension: Secondary | ICD-10-CM | POA: Diagnosis not present

## 2018-06-14 DIAGNOSIS — E118 Type 2 diabetes mellitus with unspecified complications: Secondary | ICD-10-CM

## 2018-06-14 LAB — POCT GLYCOSYLATED HEMOGLOBIN (HGB A1C): HbA1c, POC (controlled diabetic range): 13.3 % — AB (ref 0.0–7.0)

## 2018-06-14 MED ORDER — OXYCODONE-ACETAMINOPHEN 5-325 MG PO TABS: 1.0000 | ORAL_TABLET | ORAL | 0 refills | Status: DC | PRN

## 2018-06-14 MED ORDER — INSULIN PEN NEEDLE 31G X 8 MM MISC: 0 refills | Status: DC

## 2018-06-14 MED ORDER — AMOXICILLIN-POT CLAVULANATE 875-125 MG PO TABS: 1.0000 | ORAL_TABLET | Freq: Two times a day (BID) | ORAL | 0 refills | Status: AC

## 2018-06-14 NOTE — Assessment & Plan Note (Signed)
BP 114/77.  Continue lisinopril 40 mg daily.

## 2018-06-14 NOTE — Assessment & Plan Note (Addendum)
Last A1c 12.2.  Unfortunately there is malfunction with the point-of-care A1c machine.  Blood A1c drawn, will call patient with results.  Per patient report unlikely to be much lower.  Have asked him to start recording sugars at breakfast lunch dinner and bedtime.  Likely need to adjust Lantus and possibly add on mealtime coverage as well. -Lantus 24 units -Metformin 1 g twice daily -Victoza 1.2 mg daily

## 2018-06-14 NOTE — Assessment & Plan Note (Signed)
Patient with multiple hip, cracked, broken teeth.  Has had multiple teeth pulled in the past for tooth abscess.  Is currently having dentures made by his dentist.

## 2018-06-14 NOTE — Assessment & Plan Note (Addendum)
Likely keep at tooth abscess.  Will give Augmentin 875/125 twice daily for 7 days.  Patient to follow-up with dentist in 1 to 2 weeks.  Gave a Percocet 5/325, enough for 10 days.

## 2018-06-14 NOTE — Patient Instructions (Addendum)
It was great seeing you again today!  I am sorry that your tooth is been hurting you.  Definitely keep your appointment with your dentist to have this treated.  In meantime I will give you some pain medications and antibiotics to help with this.  In regards to your hemoglobin A1c.  Fortunately our machine malfunctioned this morning when I get the result back.  We drew blood test she will take a couple days come back, I will call you with results and talk about further management.

## 2018-06-14 NOTE — Progress Notes (Signed)
   HPI 43 year old male who presents for A1c check and for acute mouth pain.  Patient states that he is not been doing very well with his diet and exercise.  He is still been taking his diabetes medications as prescribed, Lantus 24 units in the morning and metformin 1 g twice daily.  A1c was drawn but point-of-care machine not working.  Blood A1c taken prior to patient leaving.  He states he is having extreme mouth pain.  States he thinks he has an abscess in the area of his right upper premolars.  Patient states that he has noticeable swelling and redness of this area.  He states he has just appointment in about 2 weeks to likely get this pulled.  They are "working on" getting him dentures.  CC: A1c check   ROS:   Review of Systems See HPI for ROS.   CC, SH/smoking status, and VS noted  Objective: BP 114/72   Pulse (!) 117   Temp 98.9 F (37.2 C) (Oral)   Ht 5' 7.5" (1.715 m)   Wt 264 lb 6.4 oz (119.9 kg)   SpO2 96%   BMI 40.80 kg/m  Gen: 43 year old male, no acute distress, resting comfortably HEENT: Numerous chipped cracked teeth in both upper and lower mouth.  Area of swelling in the upper right premolar area, with notable erythema and skin breakdown of overlying gum. CV: RRR, no murmur Resp: CTAB, no wheezes, non-labored Neuro: Alert and oriented, Speech clear, No gross deficits   Assessment and plan:  Type 2 diabetes mellitus with complication (HCC) Last A1c 12.2.  Unfortunately there is malfunction with the point-of-care A1c machine.  Blood A1c drawn, will call patient with results.  Per patient report unlikely to be much lower.  Have asked him to start recording sugars at breakfast lunch dinner and bedtime.  Likely need to adjust Lantus and possibly add on mealtime coverage as well. -Lantus 24 units -Metformin 1 g twice daily -Victoza 1.2 mg daily  Tooth abscess Likely keep at tooth abscess.  Will give Augmentin 875/125 twice daily for 7 days.  Patient to follow-up  with dentist in 1 to 2 weeks.  Gave a Percocet 5/325, enough for 10 days.  Poor dentition Patient with multiple hip, cracked, broken teeth.  Has had multiple teeth pulled in the past for tooth abscess.  Is currently having dentures made by his dentist.  Essential hypertension BP 114/77.  Continue lisinopril 40 mg daily.   Orders Placed This Encounter  Procedures  . HgB A1c    Meds ordered this encounter  Medications  . amoxicillin-clavulanate (AUGMENTIN) 875-125 MG tablet    Sig: Take 1 tablet by mouth every 12 (twelve) hours for 7 days.    Dispense:  14 tablet    Refill:  0  . oxyCODONE-acetaminophen (PERCOCET/ROXICET) 5-325 MG tablet    Sig: Take 1 tablet by mouth every 4 (four) hours as needed for severe pain.    Dispense:  30 tablet    Refill:  0  . Insulin Pen Needle (B-D ULTRAFINE III SHORT PEN) 31G X 8 MM MISC    Sig: USE AS DIRECTED WITH INSULIN PEN    Dispense:  100 each    Refill:  0     Myrene Buddy MD PGY-2 Family Medicine Resident  06/14/2018 11:50 AM

## 2018-06-16 ENCOUNTER — Other Ambulatory Visit: Payer: Self-pay | Admitting: Family Medicine

## 2018-06-16 DIAGNOSIS — E118 Type 2 diabetes mellitus with unspecified complications: Secondary | ICD-10-CM

## 2018-07-11 ENCOUNTER — Encounter: Payer: Self-pay | Admitting: Family Medicine

## 2018-07-13 MED ORDER — SILDENAFIL CITRATE 100 MG PO TABS: 50.0000 mg | ORAL_TABLET | Freq: Every day | ORAL | 0 refills | Status: DC | PRN

## 2018-07-14 ENCOUNTER — Other Ambulatory Visit: Payer: Self-pay | Admitting: Family Medicine

## 2018-07-14 ENCOUNTER — Encounter: Payer: Self-pay | Admitting: Family Medicine

## 2018-07-15 ENCOUNTER — Other Ambulatory Visit: Payer: Self-pay

## 2018-07-15 ENCOUNTER — Encounter: Payer: Self-pay | Admitting: Family Medicine

## 2018-07-15 MED ORDER — SILDENAFIL CITRATE 100 MG PO TABS: 50.0000 mg | ORAL_TABLET | Freq: Every day | ORAL | 0 refills | Status: DC | PRN

## 2018-07-15 MED ORDER — LISINOPRIL 40 MG PO TABS: 40.0000 mg | ORAL_TABLET | Freq: Every day | ORAL | 0 refills | Status: DC

## 2018-07-16 ENCOUNTER — Other Ambulatory Visit: Payer: Self-pay | Admitting: Family Medicine

## 2018-07-16 DIAGNOSIS — E118 Type 2 diabetes mellitus with unspecified complications: Secondary | ICD-10-CM

## 2018-07-17 ENCOUNTER — Other Ambulatory Visit: Payer: Self-pay | Admitting: Family Medicine

## 2018-07-17 DIAGNOSIS — E118 Type 2 diabetes mellitus with unspecified complications: Secondary | ICD-10-CM

## 2018-07-18 ENCOUNTER — Other Ambulatory Visit: Payer: Self-pay

## 2018-07-18 MED ORDER — LISINOPRIL 40 MG PO TABS: 40.0000 mg | ORAL_TABLET | Freq: Every day | ORAL | 0 refills | Status: DC

## 2018-07-18 NOTE — Telephone Encounter (Signed)
Re-sent to correct pharmacy. Alisa Brake, RN ([facility] RN)  

## 2018-07-19 ENCOUNTER — Ambulatory Visit: Payer: Medicaid Other | Admitting: Podiatry

## 2018-07-19 ENCOUNTER — Encounter: Payer: Self-pay | Admitting: Podiatry

## 2018-07-19 DIAGNOSIS — E1149 Type 2 diabetes mellitus with other diabetic neurological complication: Secondary | ICD-10-CM

## 2018-07-19 DIAGNOSIS — B351 Tinea unguium: Secondary | ICD-10-CM

## 2018-07-19 DIAGNOSIS — M79676 Pain in unspecified toe(s): Secondary | ICD-10-CM | POA: Diagnosis not present

## 2018-07-19 DIAGNOSIS — E1140 Type 2 diabetes mellitus with diabetic neuropathy, unspecified: Secondary | ICD-10-CM

## 2018-07-19 NOTE — Progress Notes (Signed)
Complaint:  Visit Type: Patient returns to my office for continued preventative foot care services. Complaint: Patient states" my nails have grown long and thick and become painful to walk and wear shoes" Patient has been diagnosed with DM with no foot complications. The patient presents for preventative foot care services. No changes to ROS  Podiatric Exam: Vascular: dorsalis pedis and posterior tibial pulses are palpable bilateral. Capillary return is immediate. Temperature gradient is WNL. Skin turgor WNL  Sensorium: Normal Semmes Weinstein monofilament test. Normal tactile sensation bilaterally. Nail Exam: Pt has thick disfigured discolored nails with subungual debris noted bilateral entire nail hallux through fifth toenails Ulcer Exam: There is no evidence of ulcer or pre-ulcerative changes or infection. Orthopedic Exam: Muscle tone and strength are WNL. No limitations in general ROM. No crepitus or effusions noted. Foot type and digits show no abnormalities. Bony prominences are unremarkable. Skin: No Porokeratosis. No infection or ulcers  Diagnosis:  Onychomycosis, , Pain in right toe, pain in left toes  Treatment & Plan Procedures and Treatment: Consent by patient was obtained for treatment procedures.   Debridement of mycotic and hypertrophic toenails, 1 through 5 bilateral and clearing of subungual debris. No ulceration, no infection noted.  Return Visit-Office Procedure: Patient instructed to return to the office for a follow up visit 3 months for continued evaluation and treatment.    Murry Diaz DPM 

## 2018-08-02 ENCOUNTER — Telehealth: Payer: Self-pay | Admitting: Family Medicine

## 2018-08-02 NOTE — Telephone Encounter (Signed)
Prescription dated 08/13/17 for Oxycodone was shredded today as patient has not ever picked up.

## 2018-08-04 ENCOUNTER — Other Ambulatory Visit: Payer: Self-pay | Admitting: Family Medicine

## 2018-08-04 DIAGNOSIS — E118 Type 2 diabetes mellitus with unspecified complications: Secondary | ICD-10-CM

## 2018-08-05 ENCOUNTER — Other Ambulatory Visit: Payer: Self-pay

## 2018-08-05 MED ORDER — LISINOPRIL 40 MG PO TABS: 40.0000 mg | ORAL_TABLET | Freq: Every day | ORAL | 0 refills | Status: DC

## 2018-08-22 ENCOUNTER — Other Ambulatory Visit: Payer: Self-pay

## 2018-08-22 MED ORDER — AMLODIPINE BESYLATE 10 MG PO TABS: ORAL_TABLET | ORAL | 0 refills | Status: DC

## 2018-10-18 ENCOUNTER — Ambulatory Visit: Payer: Medicaid Other | Admitting: Podiatry

## 2018-12-27 ENCOUNTER — Other Ambulatory Visit: Payer: Self-pay

## 2018-12-27 ENCOUNTER — Telehealth: Payer: Medicaid Other | Admitting: Family Medicine

## 2018-12-27 NOTE — Progress Notes (Signed)
Attempted to call patient x3 at 8:45, 8:50, and 9:00. Will mark as no show and no charge for the visit. Can call back and try and reschedule.  Guadalupe Dawn MD PGY-3 Family Medicine Resident

## 2019-01-03 ENCOUNTER — Encounter: Payer: Self-pay | Admitting: Family Medicine

## 2019-01-03 ENCOUNTER — Other Ambulatory Visit: Payer: Self-pay | Admitting: Family Medicine

## 2019-01-03 ENCOUNTER — Other Ambulatory Visit: Payer: Self-pay

## 2019-01-03 ENCOUNTER — Telehealth (INDEPENDENT_AMBULATORY_CARE_PROVIDER_SITE_OTHER): Payer: Medicaid Other | Admitting: Family Medicine

## 2019-01-03 DIAGNOSIS — E118 Type 2 diabetes mellitus with unspecified complications: Secondary | ICD-10-CM

## 2019-01-03 DIAGNOSIS — I10 Essential (primary) hypertension: Secondary | ICD-10-CM

## 2019-01-03 MED ORDER — LIRAGLUTIDE 18 MG/3ML ~~LOC~~ SOPN: 1.2000 mg | PEN_INJECTOR | Freq: Every day | SUBCUTANEOUS | 1 refills | Status: DC

## 2019-01-03 NOTE — Progress Notes (Signed)
[facility] Telemedicine Visit  Patient consented to have virtual visit. Method of visit: Telephone  Encounter participants: Patient: WENDEL HOMEYER - located at home Provider: Guadalupe Dawn - located at [facility] Others (if applicable): n/a  Chief Complaint: dm, htn management  HPI: 43 year old AA male who presents as telemedicine visit. Patient is interested in diabetes and hypertension management.  States that he has been checking his sugars about once per day and they have been in the low 200s.  He has not been checking his blood pressure at all.  Patient has been taking his medications as prescribed except for the Victoza.  He states he did not know what this medication was has not been taking it.  He is interested in trying it.  ROS: per HPI  Pertinent PMHx: t2dm, htn  Exam:  General: No acute distress, comfortable over the phone Respiratory: Able speak in clear, coherent sentences. NO respiratory distress Psych: Appropriate  Assessment/Plan:  Essential hypertension Plan the patient that I am unable to accurately and safely treat his blood pressure without having some sort of readings than last month.  Advised he come in for a blood pressure checkup.  Type 2 diabetes mellitus with complication (HCC) Advised I cannot safely adjust his long-acting insulin or really make any changes to his diabetic regimen without having a better idea of his sugars.  Told patient to check sugars 3 times daily with meals keep a journal over the next week or so.  Did advise he start taking his Victoza prescribed.  Patient to follow-up in around a week for in person visit to check A1c.    Time spent during visit with patient: 8 minutes

## 2019-01-06 MED ORDER — LISINOPRIL 40 MG PO TABS: 40.0000 mg | ORAL_TABLET | Freq: Every day | ORAL | 0 refills | Status: DC

## 2019-01-06 MED ORDER — LANTUS SOLOSTAR 100 UNIT/ML ~~LOC~~ SOPN: PEN_INJECTOR | SUBCUTANEOUS | 0 refills | Status: DC

## 2019-01-06 MED ORDER — BD PEN NEEDLE SHORT U/F 31G X 8 MM MISC: 0 refills | Status: DC

## 2019-01-06 MED ORDER — AMLODIPINE BESYLATE 10 MG PO TABS: ORAL_TABLET | ORAL | 0 refills | Status: DC

## 2019-01-06 NOTE — Assessment & Plan Note (Signed)
Advised I cannot safely adjust his long-acting insulin or really make any changes to his diabetic regimen without having a better idea of his sugars.  Told patient to check sugars 3 times daily with meals keep a journal over the next week or so.  Did advise he start taking his Victoza prescribed.  Patient to follow-up in around a week for in person visit to check A1c.

## 2019-01-06 NOTE — Assessment & Plan Note (Signed)
Plan the patient that I am unable to accurately and safely treat his blood pressure without having some sort of readings than last month.  Advised he come in for a blood pressure checkup.

## 2019-01-07 ENCOUNTER — Ambulatory Visit (INDEPENDENT_AMBULATORY_CARE_PROVIDER_SITE_OTHER): Payer: Medicaid Other | Admitting: Family Medicine

## 2019-01-07 ENCOUNTER — Other Ambulatory Visit: Payer: Self-pay

## 2019-01-07 VITALS — BP 134/80 | HR 112 | Ht 67.0 in

## 2019-01-07 DIAGNOSIS — E118 Type 2 diabetes mellitus with unspecified complications: Secondary | ICD-10-CM | POA: Diagnosis not present

## 2019-01-07 DIAGNOSIS — M25552 Pain in left hip: Secondary | ICD-10-CM | POA: Diagnosis not present

## 2019-01-07 DIAGNOSIS — I10 Essential (primary) hypertension: Secondary | ICD-10-CM | POA: Diagnosis not present

## 2019-01-07 LAB — POCT GLYCOSYLATED HEMOGLOBIN (HGB A1C): Hemoglobin A1C: 12.9 % — AB (ref 4.0–5.6)

## 2019-01-07 MED ORDER — ACCU-CHEK AVIVA PLUS W/DEVICE KIT: 1.0000 | PACK | Freq: Three times a day (TID) | 0 refills | Status: DC | PRN

## 2019-01-07 MED ORDER — FLUTICASONE PROPIONATE 50 MCG/ACT NA SUSP: 1.0000 | Freq: Every day | NASAL | 0 refills | Status: DC | PRN

## 2019-01-07 MED ORDER — LANTUS SOLOSTAR 100 UNIT/ML ~~LOC~~ SOPN: PEN_INJECTOR | SUBCUTANEOUS | 0 refills | Status: DC

## 2019-01-07 MED ORDER — OXYCODONE HCL 5 MG PO TABS: 5.0000 mg | ORAL_TABLET | Freq: Three times a day (TID) | ORAL | 0 refills | Status: DC | PRN

## 2019-01-07 NOTE — Patient Instructions (Addendum)
It is great seeing you again today!  Your blood pressure looks good so no changes to his medications today.  Your A1c has improved a little bit from 13.3-12.9.  We are still a long way from our goal.  Continue taking the metformin and Victoza.  We came to the mutual decision to increase her Lantus 35 units.  I sent a prescription for a glucometer.  Avoid local low blood sugar heart rate in chart.  Start about your leg pain.  I will make a referral to orthopedics after a figure out which place to send it to you.  I gave you a 30 pill supply of oxycodone to tide you over until you see him.

## 2019-01-08 ENCOUNTER — Other Ambulatory Visit: Payer: Self-pay | Admitting: Family Medicine

## 2019-01-08 ENCOUNTER — Encounter: Payer: Self-pay | Admitting: Family Medicine

## 2019-01-08 MED ORDER — SILDENAFIL CITRATE 100 MG PO TABS: 50.0000 mg | ORAL_TABLET | Freq: Every day | ORAL | 0 refills | Status: DC | PRN

## 2019-01-10 ENCOUNTER — Encounter: Payer: Self-pay | Admitting: Family Medicine

## 2019-01-10 ENCOUNTER — Ambulatory Visit: Payer: Medicaid Other | Admitting: Podiatry

## 2019-01-10 DIAGNOSIS — M25552 Pain in left hip: Secondary | ICD-10-CM | POA: Insufficient documentation
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
## 2019-01-10 NOTE — Assessment & Plan Note (Signed)
A1c improved minimally from 13.3-12.9.  Patient to continue taking metformin 1000 mg twice daily, Victoza 1.2 mg daily.  Discussed increasing Lantus dose to 35 units daily.  Also discussed starting sliding scale short acting insulin.  Patient did not feel confident that he could accurately administer his sliding scale.  Can initial decision to increase Lantus dose to 35 units and continue his oral medications. -Continue metformin 1 g twice daily -Victoza 1.2 mg daily -Lantus 35 units daily -Follow-up in 3 months
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
## 2019-01-10 NOTE — Assessment & Plan Note (Signed)
BP 134/80.  Will continue amlodipine 10 mg, lisinopril 40 mg.

## 2019-01-12 ENCOUNTER — Encounter: Payer: Self-pay | Admitting: Family Medicine

## 2019-01-13 ENCOUNTER — Telehealth: Payer: Self-pay | Admitting: Family Medicine

## 2019-01-13 DIAGNOSIS — E118 Type 2 diabetes mellitus with unspecified complications: Secondary | ICD-10-CM

## 2019-01-13 MED ORDER — LIRAGLUTIDE 18 MG/3ML ~~LOC~~ SOPN: 1.2000 mg | PEN_INJECTOR | Freq: Every day | SUBCUTANEOUS | 0 refills | Status: DC

## 2019-01-13 NOTE — Telephone Encounter (Signed)
Sent in [place] rx per patient request via mychart

## 2019-01-15 ENCOUNTER — Encounter: Payer: Self-pay | Admitting: Podiatry

## 2019-01-15 ENCOUNTER — Ambulatory Visit: Payer: Medicaid Other | Admitting: Podiatry

## 2019-01-15 ENCOUNTER — Other Ambulatory Visit: Payer: Self-pay

## 2019-01-15 DIAGNOSIS — E1140 Type 2 diabetes mellitus with diabetic neuropathy, unspecified: Secondary | ICD-10-CM

## 2019-01-15 DIAGNOSIS — B351 Tinea unguium: Secondary | ICD-10-CM

## 2019-01-15 DIAGNOSIS — E1149 Type 2 diabetes mellitus with other diabetic neurological complication: Secondary | ICD-10-CM

## 2019-01-15 DIAGNOSIS — M79676 Pain in unspecified toe(s): Secondary | ICD-10-CM

## 2019-01-15 NOTE — Progress Notes (Signed)
Complaint:  Visit Type: Patient returns to my office for continued preventative foot care services. Complaint: Patient states" my nails have grown long and thick and become painful to walk and wear shoes" Patient has been diagnosed with DM with no foot complications. The patient presents for preventative foot care services. No changes to ROS  Podiatric Exam: Vascular: dorsalis pedis and posterior tibial pulses are palpable bilateral. Capillary return is immediate. Temperature gradient is WNL. Skin turgor WNL  Sensorium: Normal Semmes Weinstein monofilament test. Normal tactile sensation bilaterally. Nail Exam: Pt has thick disfigured discolored nails with subungual debris noted bilateral entire nail hallux through fifth toenails Ulcer Exam: There is no evidence of ulcer or pre-ulcerative changes or infection. Orthopedic Exam: Muscle tone and strength are WNL. No limitations in general ROM. No crepitus or effusions noted. Foot type and digits show no abnormalities. Bony prominences are unremarkable. Skin: No Porokeratosis. No infection or ulcers  Diagnosis:  Onychomycosis, , Pain in right toe, pain in left toes  Treatment & Plan Procedures and Treatment: Consent by patient was obtained for treatment procedures.   Debridement of mycotic and hypertrophic toenails, 1 through 5 bilateral and clearing of subungual debris. No ulceration, no infection noted.  Return Visit-Office Procedure: Patient instructed to return to the office for a follow up visit 4 months for continued evaluation and treatment.    Jermon Chalfant DPM 

## 2019-01-18 ENCOUNTER — Encounter: Payer: Self-pay | Admitting: Family Medicine

## 2019-01-19 ENCOUNTER — Encounter: Payer: Self-pay | Admitting: Family Medicine

## 2019-01-28 ENCOUNTER — Encounter: Payer: Self-pay | Admitting: Family Medicine

## 2019-02-03 ENCOUNTER — Other Ambulatory Visit: Payer: Self-pay | Admitting: Family Medicine

## 2019-02-04 ENCOUNTER — Encounter: Payer: Self-pay | Admitting: Family Medicine

## 2019-02-04 MED ORDER — SILDENAFIL CITRATE 100 MG PO TABS: 50.0000 mg | ORAL_TABLET | Freq: Every day | ORAL | 0 refills | Status: DC | PRN

## 2019-02-04 MED ORDER — OXYCODONE-ACETAMINOPHEN 5-325 MG PO TABS: 1.0000 | ORAL_TABLET | ORAL | 0 refills | Status: DC | PRN

## 2019-02-09 ENCOUNTER — Encounter: Payer: Self-pay | Admitting: Family Medicine

## 2019-02-12 ENCOUNTER — Encounter: Payer: Self-pay | Admitting: Family Medicine

## 2019-02-14 ENCOUNTER — Other Ambulatory Visit: Payer: Self-pay | Admitting: Family Medicine

## 2019-02-14 MED ORDER — GLUCOSE BLOOD VI STRP: ORAL_STRIP | 12 refills | Status: DC

## 2019-02-14 NOTE — Progress Notes (Signed)
Sent in refill for test strips  Guadalupe Dawn MD PGY-3 Family Medicine Resident

## 2019-02-25 ENCOUNTER — Encounter: Payer: Self-pay | Admitting: Family Medicine

## 2019-03-04 ENCOUNTER — Encounter: Payer: Self-pay | Admitting: Family Medicine

## 2019-03-05 ENCOUNTER — Other Ambulatory Visit: Payer: Self-pay | Admitting: Family Medicine

## 2019-03-06 MED ORDER — OXYCODONE-ACETAMINOPHEN 5-325 MG PO TABS: 1.0000 | ORAL_TABLET | ORAL | 0 refills | Status: DC | PRN

## 2019-03-07 ENCOUNTER — Other Ambulatory Visit: Payer: Self-pay

## 2019-03-07 ENCOUNTER — Other Ambulatory Visit: Payer: Self-pay | Admitting: Family Medicine

## 2019-03-07 DIAGNOSIS — E118 Type 2 diabetes mellitus with unspecified complications: Secondary | ICD-10-CM

## 2019-03-07 MED ORDER — LANTUS SOLOSTAR 100 UNIT/ML ~~LOC~~ SOPN: PEN_INJECTOR | SUBCUTANEOUS | 0 refills | Status: DC

## 2019-03-13 ENCOUNTER — Other Ambulatory Visit: Payer: Self-pay | Admitting: Family Medicine

## 2019-03-21 ENCOUNTER — Encounter: Payer: Self-pay | Admitting: Family Medicine

## 2019-03-23 ENCOUNTER — Encounter: Payer: Self-pay | Admitting: Family Medicine

## 2019-03-31 ENCOUNTER — Other Ambulatory Visit: Payer: Self-pay | Admitting: Family Medicine

## 2019-04-03 ENCOUNTER — Encounter: Payer: Self-pay | Admitting: Family Medicine

## 2019-04-03 ENCOUNTER — Other Ambulatory Visit: Payer: Self-pay | Admitting: Family Medicine

## 2019-04-03 MED ORDER — OXYCODONE-ACETAMINOPHEN 5-325 MG PO TABS: 1.0000 | ORAL_TABLET | ORAL | 0 refills | Status: DC | PRN

## 2019-04-04 ENCOUNTER — Encounter: Payer: Self-pay | Admitting: Family Medicine

## 2019-04-04 ENCOUNTER — Other Ambulatory Visit: Payer: Self-pay | Admitting: Family Medicine

## 2019-04-04 DIAGNOSIS — E118 Type 2 diabetes mellitus with unspecified complications: Secondary | ICD-10-CM

## 2019-04-14 ENCOUNTER — Emergency Department (HOSPITAL_COMMUNITY)
Admission: EM | Admit: 2019-04-14 | Discharge: 2019-04-15 | Disposition: A | Discharge status: Home / Self Care | Payer: Medicaid Other | Attending: Emergency Medicine | Admitting: Emergency Medicine

## 2019-04-14 ENCOUNTER — Other Ambulatory Visit: Payer: Self-pay

## 2019-04-14 ENCOUNTER — Encounter: Payer: Self-pay | Admitting: Family Medicine

## 2019-04-14 DIAGNOSIS — Z794 Long term (current) use of insulin: Secondary | ICD-10-CM | POA: Diagnosis not present

## 2019-04-14 DIAGNOSIS — I10 Essential (primary) hypertension: Secondary | ICD-10-CM | POA: Diagnosis not present

## 2019-04-14 DIAGNOSIS — Z20828 Contact with and (suspected) exposure to other viral communicable diseases: Secondary | ICD-10-CM | POA: Diagnosis not present

## 2019-04-14 DIAGNOSIS — F319 Bipolar disorder, unspecified: Secondary | ICD-10-CM | POA: Insufficient documentation

## 2019-04-14 DIAGNOSIS — Z046 Encounter for general psychiatric examination, requested by authority: Secondary | ICD-10-CM | POA: Insufficient documentation

## 2019-04-14 DIAGNOSIS — E119 Type 2 diabetes mellitus without complications: Secondary | ICD-10-CM | POA: Insufficient documentation

## 2019-04-14 LAB — SALICYLATE LEVEL: Salicylate Lvl: <7 mg/dL (ref 2.8–30.0)

## 2019-04-14 LAB — COMPREHENSIVE METABOLIC PANEL
ALT: 26 U/L (ref 0–44)
AST: 20 U/L (ref 15–41)
Albumin: 3.2 g/dL — ABNORMAL LOW (ref 3.5–5.0)
Alkaline Phosphatase: 50 U/L (ref 38–126)
Anion gap: 14 (ref 5–15)
BUN: 9 mg/dL (ref 6–20)
CO2: 20 mmol/L — ABNORMAL LOW (ref 22–32)
Calcium: 9.1 mg/dL (ref 8.9–10.3)
Chloride: 105 mmol/L (ref 98–111)
Creatinine, Ser: 0.75 mg/dL (ref 0.61–1.24)
GFR calc Af Amer: >60 mL/min (ref >60)
GFR calc non Af Amer: >60 mL/min (ref >60)
Glucose, Bld: 98 mg/dL (ref 70–99)
Potassium: 3.7 mmol/L (ref 3.5–5.1)
Sodium: 139 mmol/L (ref 135–145)
Total Bilirubin: 0.4 mg/dL (ref 0.3–1.2)
Total Protein: 7 g/dL (ref 6.5–8.1)

## 2019-04-14 LAB — ACETAMINOPHEN LEVEL: Acetaminophen (Tylenol), Serum: <10 ug/mL — ABNORMAL LOW (ref 10–30)

## 2019-04-14 LAB — CBC WITH DIFFERENTIAL/PLATELET
Abs Immature Granulocytes: 0.02 10*3/uL (ref 0.00–0.07)
Basophils Absolute: 0 10*3/uL (ref 0.0–0.1)
Basophils Relative: 1 %
Eosinophils Absolute: 0.1 10*3/uL (ref 0.0–0.5)
Eosinophils Relative: 2 %
HCT: 35.6 % — ABNORMAL LOW (ref 39.0–52.0)
Hemoglobin: 11.8 g/dL — ABNORMAL LOW (ref 13.0–17.0)
Immature Granulocytes: 0 %
Lymphocytes Relative: 39 %
Lymphs Abs: 2.3 10*3/uL (ref 0.7–4.0)
MCH: 29.9 pg (ref 26.0–34.0)
MCHC: 33.1 g/dL (ref 30.0–36.0)
MCV: 90.4 fL (ref 80.0–100.0)
Monocytes Absolute: 0.4 10*3/uL (ref 0.1–1.0)
Monocytes Relative: 7 %
Neutro Abs: 3.1 10*3/uL (ref 1.7–7.7)
Neutrophils Relative %: 51 %
Platelets: 307 10*3/uL (ref 150–400)
RBC: 3.94 MIL/uL — ABNORMAL LOW (ref 4.22–5.81)
RDW: 11.6 % (ref 11.5–15.5)
WBC: 5.9 10*3/uL (ref 4.0–10.5)
nRBC: 0 % (ref 0.0–0.2)

## 2019-04-14 LAB — CBG MONITORING, ED: Glucose-Capillary: 96 mg/dL (ref 70–99)

## 2019-04-14 LAB — ETHANOL: Alcohol, Ethyl (B): 40 mg/dL — ABNORMAL HIGH (ref <10)

## 2019-04-14 MED ORDER — LISINOPRIL 20 MG PO TABS
40.0000 mg | ORAL_TABLET | Freq: Every day | ORAL | Status: DC
  Administered 2019-04-14 – 2019-04-15 (×2): 40 mg via ORAL
  Filled 2019-04-14 (×3): qty 2

## 2019-04-14 MED ORDER — LIRAGLUTIDE 18 MG/3ML ~~LOC~~ SOPN: 1.2000 mg | PEN_INJECTOR | Freq: Every day | SUBCUTANEOUS | Status: DC

## 2019-04-14 MED ORDER — METHOCARBAMOL 500 MG PO TABS
500.0000 mg | ORAL_TABLET | Freq: Every evening | ORAL | Status: DC | PRN
  Administered 2019-04-14: 500 mg via ORAL
  Filled 2019-04-14: qty 1

## 2019-04-14 MED ORDER — METFORMIN HCL 500 MG PO TABS
1000.0000 mg | ORAL_TABLET | Freq: Two times a day (BID) | ORAL | Status: DC
  Administered 2019-04-15: 1000 mg via ORAL
  Filled 2019-04-14: qty 2

## 2019-04-14 MED ORDER — IBUPROFEN 200 MG PO TABS: 600.0000 mg | ORAL_TABLET | Freq: Four times a day (QID) | ORAL | Status: DC | PRN

## 2019-04-14 NOTE — ED Notes (Signed)
PT aware of urine sample 
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
## 2019-04-14 NOTE — Progress Notes (Signed)
Received Marcus [name] at the change of shift asleep in his room. He was awaken for blood draw and his EKG. Later he was compliant with his medications and the Covid test. Later he spoke with TTS. He slept throughout the night without incident.
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
## 2019-04-15 ENCOUNTER — Encounter (HOSPITAL_COMMUNITY): Payer: Self-pay | Admitting: Registered Nurse

## 2019-04-15 LAB — RAPID URINE DRUG SCREEN, HOSP PERFORMED
Amphetamines: NOT DETECTED
Barbiturates: NOT DETECTED
Benzodiazepines: NOT DETECTED
Cocaine: NOT DETECTED
Opiates: NOT DETECTED
Tetrahydrocannabinol: NOT DETECTED

## 2019-04-15 LAB — SARS CORONAVIRUS 2 (TAT 6-24 HRS): SARS Coronavirus 2: NEGATIVE

## 2019-04-15 LAB — CBG MONITORING, ED: Glucose-Capillary: 122 mg/dL — ABNORMAL HIGH (ref 70–99)
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
## 2019-04-15 NOTE — ED Notes (Signed)
Pt discharged home. Discharged instructions read to pt who verbalized understanding. All belongings returned to pt. Denies SI/HI, is not delusional and not responding to internal stimuli. Escorted pt to the ED exit.   
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
## 2019-04-15 NOTE — Discharge Instructions (Addendum)
Please follow-up with your primary care doctor to discuss the symptoms you are experiencing today.  If you have any thoughts of hurting others or hurting yourself, see or hear things other people do not see or hear, please return to ER for reassessment.  For your mental health needs, you are advised to continue treatment with [facility].  You have been scheduled for a follow-up appointment on Monday, April 21, 2019 at 8:00 am:       [facility]      [address]      [place], [place] [address]      [phone]      Crisis number: [phone]
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
## 2019-04-15 NOTE — BH Assessment (Addendum)
[place] Assessment Progress Note  Per Neita Garnet, MD, this pt does not require psychiatric hospitalization at this time.  Pt presents under IVC initiated by pt's wife, which Dr Parke Poisson has rescinded.  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  He asks for a new appointment to be scheduled.  This Journalist, newspaper and left a message.  I later found a voice mail from [facility] stating that pt was scheduled for a follow-up appointment on Monday, 04/21/2019 at 08:00.  This has been included in pt's discharge instructions.  Pt's nurse, Diane, has been notified.  Jalene Mullet, [place] Triage Specialist [phone]

## 2019-04-16 ENCOUNTER — Encounter: Payer: Self-pay | Admitting: Family Medicine

## 2019-04-17 ENCOUNTER — Other Ambulatory Visit: Payer: Self-pay | Admitting: Family Medicine

## 2019-04-17 DIAGNOSIS — E118 Type 2 diabetes mellitus with unspecified complications: Secondary | ICD-10-CM

## 2019-04-17 MED ORDER — VICTOZA 18 MG/3ML ~~LOC~~ SOPN: PEN_INJECTOR | SUBCUTANEOUS | 0 refills | Status: DC

## 2019-04-19 ENCOUNTER — Encounter: Payer: Self-pay | Admitting: Family Medicine

## 2019-04-22 ENCOUNTER — Other Ambulatory Visit: Payer: Self-pay | Admitting: Family Medicine

## 2019-04-22 ENCOUNTER — Encounter: Payer: Self-pay | Admitting: Family Medicine

## 2019-04-22 DIAGNOSIS — E119 Type 2 diabetes mellitus without complications: Secondary | ICD-10-CM

## 2019-04-22 DIAGNOSIS — Z794 Long term (current) use of insulin: Secondary | ICD-10-CM

## 2019-04-22 MED ORDER — METFORMIN HCL 1000 MG PO TABS: 1000.0000 mg | ORAL_TABLET | Freq: Two times a day (BID) | ORAL | 0 refills | Status: DC

## 2019-04-23 ENCOUNTER — Other Ambulatory Visit: Payer: Self-pay | Admitting: Family Medicine

## 2019-04-23 MED ORDER — LISINOPRIL 40 MG PO TABS: 40.0000 mg | ORAL_TABLET | Freq: Every day | ORAL | 0 refills | Status: DC

## 2019-05-01 ENCOUNTER — Encounter: Payer: Self-pay | Admitting: Family Medicine

## 2019-05-02 ENCOUNTER — Other Ambulatory Visit: Payer: Self-pay

## 2019-05-02 ENCOUNTER — Ambulatory Visit (INDEPENDENT_AMBULATORY_CARE_PROVIDER_SITE_OTHER): Payer: Medicaid Other | Admitting: Family Medicine

## 2019-05-02 ENCOUNTER — Encounter: Payer: Self-pay | Admitting: Family Medicine

## 2019-05-02 VITALS — BP 128/80 | HR 105 | Wt 264.6 lb

## 2019-05-02 DIAGNOSIS — E118 Type 2 diabetes mellitus with unspecified complications: Secondary | ICD-10-CM

## 2019-05-02 DIAGNOSIS — M25552 Pain in left hip: Secondary | ICD-10-CM | POA: Diagnosis not present

## 2019-05-02 LAB — POCT GLYCOSYLATED HEMOGLOBIN (HGB A1C): HbA1c, POC (controlled diabetic range): 8.7 % — AB (ref 0.0–7.0)

## 2019-05-02 MED ORDER — MELOXICAM 15 MG PO TABS: 15.0000 mg | ORAL_TABLET | Freq: Every day | ORAL | 0 refills | Status: DC

## 2019-05-02 NOTE — Patient Instructions (Addendum)
It was great seeing you again today!  I am glad you are able to get your eyes checked out.  Your A1c has improved dramatically from 12.9-> 8.7.  Congratulations as this is a marked decrease.  A drop of over 3 points in a little bit more than 3 months is incredible.  Continue to keep up the good work and make good dietary choices.  I do not think we need to change her regimen at this time because you have improved so much.  I would like to see you back in about 3 months  I will check on the status of your orthopedic referral.  In the meantime I do not think oxycodone is a good idea, given it has high addictive potential and your body gets used to it over time.  I think trying anti-inflammatory medication called meloxicam is a good option at this time.  I sent in a 2-week supply to your pharmacy.  We can add on gabapentin if we need to.
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
## 2019-05-06 NOTE — Assessment & Plan Note (Addendum)
Hemoglobin A1c 12.9->8.7.  Congratulated patient on his tremendous improvement.  Given that improvement in his A1c I do not want to alter his regimen at this time.  Can take glargine 35 units daily, metformin 1 g twice daily, Victoza 1.2 mg daily.  Follow-up A1c in 3 months

## 2019-05-21 ENCOUNTER — Ambulatory Visit: Payer: Medicaid Other | Admitting: Podiatry

## 2019-05-22 ENCOUNTER — Encounter: Payer: Self-pay | Admitting: Family Medicine

## 2019-05-23 ENCOUNTER — Telehealth: Payer: Self-pay

## 2019-05-23 NOTE — Telephone Encounter (Signed)
Pharmacy calling regarding clarification of rx order for Victoza. Per pharmacist, last order was for 3 prefilled pens and this order is for 92ml which is only one pen. Wanting clarification as to which order is correct.   Please advise.   To PCP  Marcus Prude, RN

## 2019-05-26 ENCOUNTER — Other Ambulatory Visit: Payer: Self-pay | Admitting: Family Medicine

## 2019-05-26 DIAGNOSIS — E118 Type 2 diabetes mellitus with unspecified complications: Secondary | ICD-10-CM

## 2019-05-26 MED ORDER — VICTOZA 18 MG/3ML ~~LOC~~ SOPN: PEN_INJECTOR | SUBCUTANEOUS | 0 refills | Status: DC

## 2019-06-18 ENCOUNTER — Encounter: Payer: Self-pay | Admitting: Podiatry

## 2019-06-18 ENCOUNTER — Ambulatory Visit: Payer: Medicaid Other | Admitting: Podiatry

## 2019-06-18 ENCOUNTER — Other Ambulatory Visit: Payer: Self-pay

## 2019-06-18 DIAGNOSIS — B351 Tinea unguium: Secondary | ICD-10-CM | POA: Diagnosis not present

## 2019-06-18 DIAGNOSIS — E1140 Type 2 diabetes mellitus with diabetic neuropathy, unspecified: Secondary | ICD-10-CM | POA: Diagnosis not present

## 2019-06-18 DIAGNOSIS — M79676 Pain in unspecified toe(s): Secondary | ICD-10-CM | POA: Diagnosis not present

## 2019-06-18 DIAGNOSIS — E1149 Type 2 diabetes mellitus with other diabetic neurological complication: Secondary | ICD-10-CM

## 2019-06-18 NOTE — Progress Notes (Signed)
Complaint:  Visit Type: Patient returns to my office for continued preventative foot care services. Complaint: Patient states" my nails have grown long and thick and become painful to walk and wear shoes" Patient has been diagnosed with DM with no foot complications. The patient presents for preventative foot care services. No changes to ROS  Podiatric Exam: Vascular: dorsalis pedis and posterior tibial pulses are palpable bilateral. Capillary return is immediate. Temperature gradient is WNL. Skin turgor WNL  Sensorium: Normal Semmes Weinstein monofilament test. Normal tactile sensation bilaterally. Nail Exam: Pt has thick disfigured discolored nails with subungual debris noted bilateral entire nail hallux through fifth toenails Ulcer Exam: There is no evidence of ulcer or pre-ulcerative changes or infection. Orthopedic Exam: Muscle tone and strength are WNL. No limitations in general ROM. No crepitus or effusions noted. Foot type and digits show no abnormalities. Bony prominences are unremarkable. Skin: No Porokeratosis. No infection or ulcers  Diagnosis:  Onychomycosis, , Pain in right toe, pain in left toes  Treatment & Plan Procedures and Treatment: Consent by patient was obtained for treatment procedures.   Debridement of mycotic and hypertrophic toenails, 1 through 5 bilateral and clearing of subungual debris. No ulceration, no infection noted.  Return Visit-Office Procedure: Patient instructed to return to the office for a follow up visit 4 months for continued evaluation and treatment.    Dhanvin Szeto DPM 

## 2019-06-20 ENCOUNTER — Ambulatory Visit: Payer: Medicaid Other | Admitting: Podiatry

## 2019-07-11 ENCOUNTER — Other Ambulatory Visit: Payer: Self-pay | Admitting: Family Medicine

## 2019-07-11 ENCOUNTER — Encounter: Payer: Self-pay | Admitting: Family Medicine

## 2019-07-11 DIAGNOSIS — E119 Type 2 diabetes mellitus without complications: Secondary | ICD-10-CM

## 2019-07-11 DIAGNOSIS — E118 Type 2 diabetes mellitus with unspecified complications: Secondary | ICD-10-CM

## 2019-07-11 DIAGNOSIS — Z794 Long term (current) use of insulin: Secondary | ICD-10-CM

## 2019-07-11 MED ORDER — LISINOPRIL 40 MG PO TABS: 40.0000 mg | ORAL_TABLET | Freq: Every day | ORAL | 0 refills | Status: DC

## 2019-07-11 MED ORDER — VICTOZA 18 MG/3ML ~~LOC~~ SOPN: PEN_INJECTOR | SUBCUTANEOUS | 0 refills | Status: DC

## 2019-07-11 MED ORDER — AMLODIPINE BESYLATE 10 MG PO TABS: ORAL_TABLET | ORAL | 0 refills | Status: DC

## 2019-07-11 MED ORDER — METFORMIN HCL 1000 MG PO TABS: 1000.0000 mg | ORAL_TABLET | Freq: Two times a day (BID) | ORAL | 0 refills | Status: DC

## 2019-07-16 ENCOUNTER — Encounter: Payer: Self-pay | Admitting: Family Medicine

## 2019-07-21 ENCOUNTER — Encounter: Payer: Self-pay | Admitting: Family Medicine

## 2019-07-22 ENCOUNTER — Other Ambulatory Visit: Payer: Self-pay | Admitting: Family Medicine

## 2019-07-22 MED ORDER — INSULIN GLARGINE 100 UNIT/ML ~~LOC~~ SOLN: 35.0000 [IU] | Freq: Every day | SUBCUTANEOUS | 3 refills | Status: DC

## 2019-07-23 ENCOUNTER — Encounter: Payer: Self-pay | Admitting: Family Medicine

## 2019-07-24 ENCOUNTER — Encounter: Payer: Self-pay | Admitting: Family Medicine

## 2019-07-25 ENCOUNTER — Other Ambulatory Visit: Payer: Self-pay | Admitting: Family Medicine

## 2019-07-25 MED ORDER — INSULIN GLARGINE 100 UNIT/ML SOLOSTAR PEN: 35.0000 [IU] | PEN_INJECTOR | SUBCUTANEOUS | 11 refills | Status: DC

## 2019-07-25 NOTE — Progress Notes (Signed)
Switched lantus from vial to pens  Myrene Buddy MD PGY-3 Family Medicine Resident

## 2019-08-08 ENCOUNTER — Encounter: Payer: Self-pay | Admitting: Family Medicine

## 2019-08-10 ENCOUNTER — Other Ambulatory Visit: Payer: Self-pay | Admitting: Family Medicine

## 2019-08-10 MED ORDER — INSULIN GLARGINE 100 UNIT/ML SOLOSTAR PEN: 35.0000 [IU] | PEN_INJECTOR | SUBCUTANEOUS | 2 refills | Status: DC

## 2019-08-21 ENCOUNTER — Encounter: Payer: Self-pay | Admitting: Family Medicine

## 2019-08-21 ENCOUNTER — Other Ambulatory Visit: Payer: Self-pay | Admitting: Family Medicine

## 2019-08-21 DIAGNOSIS — E118 Type 2 diabetes mellitus with unspecified complications: Secondary | ICD-10-CM

## 2019-08-22 MED ORDER — VICTOZA 18 MG/3ML ~~LOC~~ SOPN: PEN_INJECTOR | SUBCUTANEOUS | 0 refills | Status: DC

## 2019-09-08 ENCOUNTER — Other Ambulatory Visit: Payer: Self-pay

## 2019-09-08 ENCOUNTER — Encounter: Payer: Self-pay | Admitting: Family Medicine

## 2019-09-08 ENCOUNTER — Ambulatory Visit (INDEPENDENT_AMBULATORY_CARE_PROVIDER_SITE_OTHER): Payer: Medicaid Other | Admitting: Family Medicine

## 2019-09-08 VITALS — BP 110/70 | HR 115 | Ht 67.0 in | Wt 270.2 lb

## 2019-09-08 DIAGNOSIS — E785 Hyperlipidemia, unspecified: Secondary | ICD-10-CM

## 2019-09-08 DIAGNOSIS — E1169 Type 2 diabetes mellitus with other specified complication: Secondary | ICD-10-CM

## 2019-09-08 DIAGNOSIS — E118 Type 2 diabetes mellitus with unspecified complications: Secondary | ICD-10-CM

## 2019-09-08 LAB — POCT GLYCOSYLATED HEMOGLOBIN (HGB A1C): HbA1c, POC (controlled diabetic range): 7 % (ref 0.0–7.0)

## 2019-09-08 NOTE — Patient Instructions (Signed)
It was great seeing you today!  Graduations your A1c has improved significantly.  It is 7.0 today.  Keep up all the good work.  I definitely congratulate you on all the hard work you have done.  Keep up the good work, now is not the time to refer back to your always.  We are going to stop your Lantus and see how you do.  I would check your sugars before meals for the next couple of days and see how your sugars are running.  Please let me know if your sugars are getting significantly above 200 consistently.  We will get a cholesterol panel at the very end.  I performed a foot exam.  I would get the COVID-19 vaccine.  The way to to sign up for this is [email or web address].  You can find information for her to sign up for the COVID-19 vaccine at that site.

## 2019-09-09 ENCOUNTER — Encounter: Payer: Self-pay | Admitting: Family Medicine

## 2019-09-09 DIAGNOSIS — E785 Hyperlipidemia, unspecified: Secondary | ICD-10-CM | POA: Insufficient documentation

## 2019-09-09 DIAGNOSIS — E1169 Type 2 diabetes mellitus with other specified complication: Secondary | ICD-10-CM | POA: Insufficient documentation

## 2019-09-09 LAB — LIPID PANEL
Chol/HDL Ratio: 7.3 ratio — ABNORMAL HIGH (ref 0.0–5.0)
Cholesterol, Total: 220 mg/dL — ABNORMAL HIGH (ref 100–199)
HDL: 30 mg/dL — ABNORMAL LOW (ref >39)
LDL Chol Calc (NIH): 138 mg/dL — ABNORMAL HIGH (ref 0–99)
Triglycerides: 284 mg/dL — ABNORMAL HIGH (ref 0–149)
VLDL Cholesterol Cal: 52 mg/dL — ABNORMAL HIGH (ref 5–40)

## 2019-09-09 NOTE — Assessment & Plan Note (Signed)
Well-controlled at this point.  Came to mutual decision with patient to stop the Lantus given that his A1c is now 7.0.  Continue Victoza 1.2 mg daily, metformin 1 g twice daily.  I did asked patient to monitor his sugars before meals for the next couple of days to see how they are reacting without the Lantus.  I asked him to let me know if they are consistently above 200.  Follow-up in 3 months.

## 2019-09-09 NOTE — Progress Notes (Signed)
   CHIEF COMPLAINT / HPI: 44 year old male who presents for diabetes check.  Patient states that he has been compliant with his medication and has been doing well with diet and exercise.  Hemoglobin A1c 7.0 today from 8.7.  He has been compliant with his regimen of Victoza 1.2 mg daily, metformin 1 g twice daily, Lantus 35 units daily.  The patient states he has also had a couple of problematic teeth pulled recently.  He states that he will get dental implants put in in the near future.  PERTINENT  PMH / PSH: Type 2 diabetes   OBJECTIVE: BP 110/70   Pulse (!) 115 Comment: provider informed  Ht 5\' 7"  (1.702 m)   Wt 270 lb 4 oz (122.6 kg)   SpO2 97%   BMI 42.33 kg/m   Gen: Pleasant 44 year old African-American male, no acute distress CV: Tachycardic, skin warm and dry Resp: No accessory muscle use, no distress Neuro: Alert and oriented, Speech clear, No gross deficits  Feet: Skin warm and dry, no ulcerations noted.  Sensation intact all distributions.  Palpable PT/DP bilaterally  ASSESSMENT / PLAN:  Type 2 diabetes mellitus with complication (HCC) Well-controlled at this point.  Came to mutual decision with patient to stop the Lantus given that his A1c is now 7.0.  Continue Victoza 1.2 mg daily, metformin 1 g twice daily.  I did asked patient to monitor his sugars before meals for the next couple of days to see how they are reacting without the Lantus.  I asked him to let me know if they are consistently above 200.  Follow-up in 3 months.  Hyperlipidemia associated with type 2 diabetes mellitus (HCC) We will draw lipid panel as he has not had one for several years.  If elevated will start moderate to high intensity statin.   58 MD PGY-3 Family Medicine Resident [facility]

## 2019-09-09 NOTE — Assessment & Plan Note (Signed)
We will draw lipid panel as he has not had one for several years.  If elevated will start moderate to high intensity statin.

## 2019-10-04 ENCOUNTER — Ambulatory Visit (INDEPENDENT_AMBULATORY_CARE_PROVIDER_SITE_OTHER)
Admission: EM | Admit: 2019-10-04 | Discharge: 2019-10-04 | Disposition: A | Discharge status: Home / Self Care | Payer: Medicaid Other | Source: Home / Self Care

## 2019-10-04 ENCOUNTER — Ambulatory Visit
Admission: RE | Admit: 2019-10-04 | Discharge: 2019-10-04 | Disposition: A | Discharge status: Home / Self Care | Payer: Medicaid Other | Source: Ambulatory Visit

## 2019-10-04 DIAGNOSIS — Z8719 Personal history of other diseases of the digestive system: Secondary | ICD-10-CM

## 2019-10-04 DIAGNOSIS — K0889 Other specified disorders of teeth and supporting structures: Secondary | ICD-10-CM | POA: Diagnosis not present

## 2019-10-04 DIAGNOSIS — R519 Headache, unspecified: Secondary | ICD-10-CM

## 2019-10-04 MED ORDER — IBUPROFEN 800 MG PO TABS: 800.0000 mg | ORAL_TABLET | Freq: Three times a day (TID) | ORAL | 0 refills | Status: DC

## 2019-10-04 MED ORDER — AMOXICILLIN-POT CLAVULANATE 875-125 MG PO TABS: 1.0000 | ORAL_TABLET | Freq: Two times a day (BID) | ORAL | 0 refills | Status: DC
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
## 2019-10-08 ENCOUNTER — Encounter: Payer: Self-pay | Admitting: Family Medicine

## 2019-10-09 ENCOUNTER — Encounter: Payer: Self-pay | Admitting: Family Medicine

## 2019-10-09 ENCOUNTER — Other Ambulatory Visit: Payer: Self-pay | Admitting: Family Medicine

## 2019-10-09 DIAGNOSIS — E119 Type 2 diabetes mellitus without complications: Secondary | ICD-10-CM

## 2019-10-09 DIAGNOSIS — Z794 Long term (current) use of insulin: Secondary | ICD-10-CM

## 2019-10-09 MED ORDER — METFORMIN HCL 1000 MG PO TABS: 1000.0000 mg | ORAL_TABLET | Freq: Two times a day (BID) | ORAL | 0 refills | Status: DC

## 2019-10-10 ENCOUNTER — Other Ambulatory Visit: Payer: Self-pay | Admitting: *Deleted

## 2019-10-10 DIAGNOSIS — E118 Type 2 diabetes mellitus with unspecified complications: Secondary | ICD-10-CM

## 2019-10-10 MED ORDER — AMLODIPINE BESYLATE 10 MG PO TABS: ORAL_TABLET | ORAL | 0 refills | Status: DC

## 2019-10-10 MED ORDER — VICTOZA 18 MG/3ML ~~LOC~~ SOPN: PEN_INJECTOR | SUBCUTANEOUS | 0 refills | Status: DC

## 2019-10-10 MED ORDER — LISINOPRIL 40 MG PO TABS: 40.0000 mg | ORAL_TABLET | Freq: Every day | ORAL | 0 refills | Status: DC

## 2019-10-15 ENCOUNTER — Encounter: Payer: Self-pay | Admitting: Family Medicine

## 2019-10-17 ENCOUNTER — Encounter: Payer: Self-pay | Admitting: Podiatry

## 2019-10-17 ENCOUNTER — Ambulatory Visit: Payer: Medicaid Other | Admitting: Podiatry

## 2019-10-17 ENCOUNTER — Other Ambulatory Visit: Payer: Self-pay

## 2019-10-17 DIAGNOSIS — E1149 Type 2 diabetes mellitus with other diabetic neurological complication: Secondary | ICD-10-CM

## 2019-10-17 DIAGNOSIS — M79676 Pain in unspecified toe(s): Secondary | ICD-10-CM

## 2019-10-17 DIAGNOSIS — B351 Tinea unguium: Secondary | ICD-10-CM

## 2019-10-17 DIAGNOSIS — E1140 Type 2 diabetes mellitus with diabetic neuropathy, unspecified: Secondary | ICD-10-CM

## 2019-10-17 NOTE — Progress Notes (Signed)
Complaint:  Visit Type: Patient returns to my office for continued preventative foot care services. Complaint: Patient states" my nails have grown long and thick and become painful to walk and wear shoes" Patient has been diagnosed with DM with no foot complications. The patient presents for preventative foot care services. No changes to ROS  Podiatric Exam: Vascular: dorsalis pedis and posterior tibial pulses are palpable bilateral. Capillary return is immediate. Temperature gradient is WNL. Skin turgor WNL  Sensorium: Normal Semmes Weinstein monofilament test. Normal tactile sensation bilaterally. Nail Exam: Pt has thick disfigured discolored nails with subungual debris noted bilateral entire nail hallux through fifth toenails Ulcer Exam: There is no evidence of ulcer or pre-ulcerative changes or infection. Orthopedic Exam: Muscle tone and strength are WNL. No limitations in general ROM. No crepitus or effusions noted. Foot type and digits show no abnormalities. Bony prominences are unremarkable. Skin: No Porokeratosis. No infection or ulcers  Diagnosis:  Onychomycosis, , Pain in right toe, pain in left toes  Treatment & Plan Procedures and Treatment: Consent by patient was obtained for treatment procedures.   Debridement of mycotic and hypertrophic toenails, 1 through 5 bilateral and clearing of subungual debris. No ulceration, no infection noted.  Return Visit-Office Procedure: Patient instructed to return to the office for a follow up visit 4 months for continued evaluation and treatment.    Matilyn Fehrman DPM 

## 2019-10-19 ENCOUNTER — Ambulatory Visit (INDEPENDENT_AMBULATORY_CARE_PROVIDER_SITE_OTHER)
Admission: RE | Admit: 2019-10-19 | Discharge: 2019-10-19 | Disposition: A | Discharge status: Home / Self Care | Payer: Medicaid Other | Source: Ambulatory Visit

## 2019-10-19 DIAGNOSIS — M79652 Pain in left thigh: Secondary | ICD-10-CM | POA: Diagnosis not present

## 2019-10-19 MED ORDER — MELOXICAM 7.5 MG PO TABS: 7.5000 mg | ORAL_TABLET | Freq: Every day | ORAL | 0 refills | Status: DC

## 2019-10-19 MED ORDER — TIZANIDINE HCL 4 MG PO TABS
4.0000 mg | ORAL_TABLET | Freq: Every day | ORAL | 0 refills | Status: DC
Start: 2019-10-19 — End: 2019-11-03
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
## 2019-10-27 ENCOUNTER — Other Ambulatory Visit: Payer: Self-pay

## 2019-10-27 MED ORDER — BD PEN NEEDLE SHORT U/F 31G X 8 MM MISC: 0 refills | Status: DC

## 2019-10-30 ENCOUNTER — Encounter: Payer: Self-pay | Admitting: Family Medicine

## 2019-11-03 ENCOUNTER — Other Ambulatory Visit: Payer: Self-pay | Admitting: Family Medicine

## 2019-11-03 MED ORDER — TIZANIDINE HCL 4 MG PO TABS: 4.0000 mg | ORAL_TABLET | Freq: Every day | ORAL | 0 refills | Status: DC

## 2019-11-24 ENCOUNTER — Encounter: Payer: Self-pay | Admitting: Family Medicine

## 2019-11-24 ENCOUNTER — Other Ambulatory Visit: Payer: Self-pay

## 2019-11-24 ENCOUNTER — Ambulatory Visit (INDEPENDENT_AMBULATORY_CARE_PROVIDER_SITE_OTHER): Payer: Medicaid Other | Admitting: Family Medicine

## 2019-11-24 VITALS — BP 120/82 | HR 120 | Ht 67.0 in | Wt 268.0 lb

## 2019-11-24 DIAGNOSIS — E1169 Type 2 diabetes mellitus with other specified complication: Secondary | ICD-10-CM

## 2019-11-24 DIAGNOSIS — R000 Tachycardia, unspecified: Secondary | ICD-10-CM | POA: Insufficient documentation

## 2019-11-24 DIAGNOSIS — Z1159 Encounter for screening for other viral diseases: Secondary | ICD-10-CM | POA: Diagnosis not present

## 2019-11-24 DIAGNOSIS — M25552 Pain in left hip: Secondary | ICD-10-CM | POA: Diagnosis not present

## 2019-11-24 DIAGNOSIS — E118 Type 2 diabetes mellitus with unspecified complications: Secondary | ICD-10-CM

## 2019-11-24 DIAGNOSIS — E785 Hyperlipidemia, unspecified: Secondary | ICD-10-CM | POA: Diagnosis not present

## 2019-11-24 LAB — POCT GLYCOSYLATED HEMOGLOBIN (HGB A1C): HbA1c, POC (controlled diabetic range): 9.8 % — AB (ref 0.0–7.0)

## 2019-11-24 MED ORDER — EMPAGLIFLOZIN 10 MG PO TABS
10.0000 mg | ORAL_TABLET | Freq: Every day | ORAL | 3 refills | Status: DC
Start: 2019-11-24 — End: 2019-11-28

## 2019-11-24 MED ORDER — ROSUVASTATIN CALCIUM 20 MG PO TABS
20.0000 mg | ORAL_TABLET | Freq: Every day | ORAL | 3 refills | Status: DC
Start: 2019-11-24 — End: 2019-11-28

## 2019-11-24 MED ORDER — TIZANIDINE HCL 4 MG PO TABS: 4.0000 mg | ORAL_TABLET | Freq: Every day | ORAL | 2 refills | Status: DC

## 2019-11-24 NOTE — Assessment & Plan Note (Signed)
Overall worsening, A1c 7.0 -> 9.8 off of the Lantus. Fasting sugars are not remarkably elevated at 110s-120s, would not restart Lantus. Metformin and liraglutide already maxed out. Will trial empaglaflozin 10 mg. Re-check in 3 months.
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
## 2019-11-24 NOTE — Assessment & Plan Note (Addendum)
Elevated HR 120 today and notably has been tachycardic during other visits. Asymptomatic with regular rhythm on exam. Previous EKG with sinus tachycardia. Will re-check TSH to rule out hyperthyroidism as most recent TSH was 4 years ago.

## 2019-11-24 NOTE — Assessment & Plan Note (Signed)
Abnormal lipids on recent lipid panel 09/08/19 including LDL 138. Will add on rosuvastatin 20 mg daily. Goal LDL < 70. Plan to re-check in 1 year from last lipid panel.

## 2019-11-24 NOTE — Patient Instructions (Signed)
It was a pleasure seeing you today, Marcus [name].  You will be taking a new medication for your diabetes called Jardiance, 10 milligrams a day. This medication can increase risk for urinary tract infections, so please give [place] a call if you develop any urinary symptoms such as burning with urination. If you become sick, please stop taking this medication until you recover.  I have refilled your tizanidine with 2 refills.  You will go to the lab to get blood work for your thyroid function as it can be a cause of fast heart rate. We will also screen you for hepatitis C (a type of liver disease), a one-time screening test that we recommend to all adults.  Please consider getting the covid vaccine, you can give [place] a call if you have any questions about it.  Dr. Wynelle Link
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
## 2019-11-25 LAB — HCV INTERPRETATION

## 2019-11-25 LAB — TSH: TSH: 2.02 u[IU]/mL (ref 0.450–4.500)

## 2019-11-25 LAB — HCV AB W REFLEX TO QUANT PCR: HCV Ab: <0.1 s/co ratio (ref 0.0–0.9)

## 2019-11-28 MED ORDER — EMPAGLIFLOZIN 10 MG PO TABS
10.0000 mg | ORAL_TABLET | Freq: Every day | ORAL | 3 refills | Status: DC
Start: 2019-11-28 — End: 2020-03-29

## 2019-11-28 MED ORDER — TIZANIDINE HCL 4 MG PO TABS: 4.0000 mg | ORAL_TABLET | Freq: Every day | ORAL | 2 refills | Status: DC

## 2019-11-28 MED ORDER — ROSUVASTATIN CALCIUM 20 MG PO TABS
20.0000 mg | ORAL_TABLET | Freq: Every day | ORAL | 3 refills | Status: DC
Start: 2019-11-28 — End: 2020-01-02

## 2019-11-28 NOTE — Addendum Note (Signed)
Addended by: Veronda Prude on: 11/28/2019 09:23 AM   Modules accepted: Orders

## 2019-11-28 NOTE — Progress Notes (Signed)
Patient calls nurse line stating issues with picking up prescriptions. Per pharmacy, Dr. Wynelle Link is not a registered medicaid provider. Spoke with Dr. McDiarmid who gave verbal okay to resend prescriptions under his name.   Veronda Prude, RN

## 2019-12-01 ENCOUNTER — Telehealth: Payer: Self-pay

## 2019-12-01 NOTE — Telephone Encounter (Signed)
Patient calls nurse line stating that he misplaced his tizanidine while on vacation. Pharmacy states that they need the provider's approval to process the additional refill early.   Forwarding to PCP  Veronda Prude, RN

## 2019-12-01 NOTE — Telephone Encounter (Signed)
Pharmacy has been updated.

## 2019-12-01 NOTE — Telephone Encounter (Signed)
Pharmacy calls nurse line again checking on status of approval. I will also send to McDiarmid, as his name is on the original prescription.

## 2019-12-03 ENCOUNTER — Other Ambulatory Visit: Payer: Self-pay

## 2019-12-03 ENCOUNTER — Inpatient Hospital Stay
Admission: RE | Admit: 2019-12-03 | Discharge: 2019-12-03 | Disposition: A | Discharge status: Home / Self Care | Payer: Medicaid Other | Source: Ambulatory Visit

## 2019-12-03 NOTE — Telephone Encounter (Signed)
Error. Marcus [name], CMA  

## 2019-12-04 ENCOUNTER — Telehealth: Payer: Medicaid Other

## 2019-12-05 ENCOUNTER — Telehealth: Payer: Medicaid Other

## 2019-12-09 ENCOUNTER — Ambulatory Visit (INDEPENDENT_AMBULATORY_CARE_PROVIDER_SITE_OTHER)
Admission: RE | Admit: 2019-12-09 | Discharge: 2019-12-09 | Disposition: A | Discharge status: Home / Self Care | Payer: Medicaid Other | Source: Ambulatory Visit

## 2019-12-09 DIAGNOSIS — R05 Cough: Secondary | ICD-10-CM | POA: Diagnosis not present

## 2019-12-09 DIAGNOSIS — R059 Cough, unspecified: Secondary | ICD-10-CM

## 2019-12-09 MED ORDER — BENZONATATE 100 MG PO CAPS
100.0000 mg | ORAL_CAPSULE | Freq: Three times a day (TID) | ORAL | 0 refills | Status: DC | PRN
Start: 2019-12-09 — End: 2020-01-05

## 2019-12-09 NOTE — Discharge Instructions (Addendum)
Take the [facility] as needed for cough.    Follow up with your primary care provider or come here to be seen in person if your symptoms are not improving.
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
## 2019-12-10 ENCOUNTER — Ambulatory Visit (INDEPENDENT_AMBULATORY_CARE_PROVIDER_SITE_OTHER): Payer: Medicaid Other | Admitting: Family Medicine

## 2019-12-10 VITALS — BP 100/70 | HR 125 | Temp 98.6°F

## 2019-12-10 DIAGNOSIS — R110 Nausea: Secondary | ICD-10-CM | POA: Diagnosis not present

## 2019-12-10 DIAGNOSIS — J069 Acute upper respiratory infection, unspecified: Secondary | ICD-10-CM | POA: Diagnosis present

## 2019-12-10 MED ORDER — ONDANSETRON HCL 4 MG PO TABS: 4.0000 mg | ORAL_TABLET | Freq: Three times a day (TID) | ORAL | 0 refills | Status: DC | PRN

## 2019-12-10 NOTE — Assessment & Plan Note (Signed)
Possibly due to hypoglycemia, has not been taking blood sugar when he feels  -Encouraged to check  Blood sugar when he feels nauseous.  -Prescribed Zofran TID PRN nausea

## 2019-12-10 NOTE — Progress Notes (Signed)
    SUBJECTIVE:   CHIEF COMPLAINT / HPI:   Patient reports to "sick visit clinic "today reporting that for about 2 weeks he has been having some nausea, coughing.  Nausea: Reports he sometimes has nausea after he wakes up in the morning, usually after he has been awake for a little while and has not eaten anything.  Eating makes him feel better.  Denies any vomiting.  Also reports having occasional shakiness with the vomiting.  Patient has history of type 2 diabetes.  Patient reports he has not checked his blood sugar at these times when he has had the nausea.  Also reports being thirsty, urinates 3-4 times daily, denies polyphagia.  Cough: Patient reports that since about 2 weeks or so he has been having a cough which is primarily nonproductive, occasionally he gets green sputum coughed up in the morning.  Yesterday he went to urgent care, he was given Lawyer.  Denies any shortness of breath, chest pain, chills, fevers.  Is occasionally having some headaches (made worse by cough), fatigue, and sneezing.  Denies any loss of sense of taste or smell.  Reports that his grandson came to visit him a couple of weeks ago and also had a cough and was sneezing at the time.   PERTINENT  PMH / PSH:  Patient Active Problem List   Diagnosis Date Noted  . Nausea 12/10/2019  . Sinus tachycardia 11/24/2019  . Hyperlipidemia associated with type 2 diabetes mellitus (HCC) 09/09/2019  . Left hip pain 01/10/2019  . Tooth abscess 06/14/2018  . Family history of prothrombin gene mutation 06/07/2016  . Family history of factor V Leiden mutation 06/07/2016  . Major depressive disorder, recurrent episode, severe, with psychosis (HCC) 04/24/2016  . Bipolar disorder, curr episode mixed, severe, with psychotic features (HCC) 04/24/2016  . 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 Poor dentition 09/06/2015  . Upper respiratory tract infection 05/20/2015  . Hyperparathyroidism (HCC) 08/07/2014  .  Testosterone deficiency 08/05/2014  . Hypogonadism in male 08/05/2014  . Morbid obesity (HCC) 08/03/2014  . Health maintenance examination 06/15/2014  . Type 2 diabetes mellitus with complication (HCC) 06/15/2014  . Essential hypertension 06/15/2014     OBJECTIVE:   BP 100/70   Pulse (!) 125   Temp 98.6 F (37 C) (Oral)   SpO2 97%    Physical exam: General: Pleasant patient, no apparent distress Respiratory: CTA bilaterally, comfortable work of breathing, no wheezes appreciated Cardio: RRR, S1-S2 present Abdomen: Soft, nontender, normal bowel sounds appreciated   ASSESSMENT/PLAN:   Nausea Possibly due to hypoglycemia, has not been taking blood sugar when he feels  -Encouraged to check  Blood sugar when he feels nauseous.  -Prescribed Zofran TID PRN nausea  Upper respiratory tract infection Cough, sneezing, symptoms most persistent with upper resp infection. Given the following instructions:  1. Continue supportive management of upper resp symptoms, including taking Tessalon if these are helpful 2. Get tested for COVID as needed     Dollene Cleveland, DO [facility]

## 2019-12-10 NOTE — Assessment & Plan Note (Signed)
Cough, sneezing, symptoms most persistent with upper resp infection. Given the following instructions:  1. Continue supportive management of upper resp symptoms, including taking Tessalon if these are helpful 2. Get tested for COVID as needed

## 2019-12-10 NOTE — Patient Instructions (Signed)
Thank you for coming in to see [place] today! Please see below to review our plan for today's visit:  1. Check blood sugar every morning and night. If it is less than 100 in the morning and you are nauseous then be sure to eat something to raise your blood sugar. Blood sugar that is very high (greater than 250) can also make you sick to your stomach but this varies from person to person. 2. Zofran 1 tablet 3 times daily as needed for nausea. This medicine can make you sleepy. 3. Please go to your pharmacy to get tested for COVID as needed!  Please call the clinic at [phone] if your symptoms worsen or you have any concerns. It was our pleasure to serve you!   Dr. Peggyann Shoals [facility]

## 2019-12-14 ENCOUNTER — Other Ambulatory Visit: Payer: Self-pay | Admitting: Family Medicine

## 2019-12-15 ENCOUNTER — Other Ambulatory Visit: Payer: Self-pay

## 2019-12-15 MED ORDER — AMLODIPINE BESYLATE 10 MG PO TABS: 10.0000 mg | ORAL_TABLET | Freq: Every day | ORAL | 3 refills | Status: DC

## 2020-01-02 ENCOUNTER — Ambulatory Visit (INDEPENDENT_AMBULATORY_CARE_PROVIDER_SITE_OTHER): Payer: Medicaid Other | Admitting: Family Medicine

## 2020-01-02 ENCOUNTER — Ambulatory Visit: Payer: Medicaid Other | Admitting: Family Medicine

## 2020-01-02 ENCOUNTER — Other Ambulatory Visit: Payer: Self-pay

## 2020-01-02 ENCOUNTER — Encounter: Payer: Self-pay | Admitting: Family Medicine

## 2020-01-02 VITALS — BP 150/88 | HR 128 | Wt 269.8 lb

## 2020-01-02 DIAGNOSIS — E1169 Type 2 diabetes mellitus with other specified complication: Secondary | ICD-10-CM | POA: Diagnosis present

## 2020-01-02 DIAGNOSIS — E785 Hyperlipidemia, unspecified: Secondary | ICD-10-CM

## 2020-01-02 DIAGNOSIS — E118 Type 2 diabetes mellitus with unspecified complications: Secondary | ICD-10-CM | POA: Diagnosis not present

## 2020-01-02 DIAGNOSIS — Z23 Encounter for immunization: Secondary | ICD-10-CM

## 2020-01-02 DIAGNOSIS — I10 Essential (primary) hypertension: Secondary | ICD-10-CM | POA: Diagnosis not present

## 2020-01-02 MED ORDER — ROSUVASTATIN CALCIUM 20 MG PO TABS: 40.0000 mg | ORAL_TABLET | Freq: Every day | ORAL | 3 refills | Status: DC

## 2020-01-02 MED ORDER — ROSUVASTATIN CALCIUM 40 MG PO TABS: 40.0000 mg | ORAL_TABLET | Freq: Every day | ORAL | 3 refills | Status: DC

## 2020-01-02 NOTE — Patient Instructions (Signed)
It was very nice to meet you today. Please enjoy the rest of your week. Today you were seen for your COVID shot. I also addressed your blood pressure, cholesterol levels, and diabetes. I have prescribed Crestor 40 mg daily. Follow up in 3 month for diabetes check up or sooner if needed.   Follow up 01/23/20 for 2nd Covid shot.   Please call the clinic at [phone] if your symptoms worsen or you have any concerns. It was our pleasure to serve you.

## 2020-01-02 NOTE — Progress Notes (Signed)
   Covid-19 Vaccination Clinic  Name:  Marcus [name]    MRN: [id number] DOB: 1975-11-01  01/02/2020  [name] was observed post Covid-19 immunization for 15 minutes without incident. He was provided with Vaccine Information Sheet and instruction to access the V-Safe system.   [name] was instructed to call 911 with any severe reactions post vaccine: Marland Kitchen Difficulty breathing  . Swelling of face and throat  . A fast heartbeat  . A bad rash all over body  . Dizziness and weakness

## 2020-01-02 NOTE — Progress Notes (Signed)
    SUBJECTIVE:   CHIEF COMPLAINT / HPI:   Marcus [name] presents to receive his first COVID vaccine. Other issues were addressed below post administration.   Hypertension: - Medications: Amlodipine 10 mg daily, lisinopril 40 mg daily, Crestor 20 mg daily - Compliance: Yes - Denies any SOB, CP, vision changes  Diabetes Current Regimen: Jardiance 10 mg daily, Victoza 1.8 mg daily, Metformin 1000 mg twice daily CBGs: 120-130 Last A1c: 9.8 on 11/24/2019 Denies polyuria, polydipsia, hypoglycemia Last Eye Exam: 03/03/2019 Statin: Crestor 20 mg daily ACE/ARB: lisinopril 40 mg daily  PERTINENT  PMH / PSH: HLD (Last lipid panel 09/08/2019 w/ ASCVD risk of 19.7%), Bipolar disorder/MDD (PHQ9 score today 0; does not appear to be on any current medication for this)  OBJECTIVE:   BP (!) 150/88   Pulse (!) 128   Wt 269 lb 12.8 oz (122.4 kg)   SpO2 98%   BMI 42.26 kg/m   General: Appears well, no acute distress. Age appropriate. Cardiac: RRR, normal heart sounds, no murmurs Respiratory: CTAB, normal effort Psych: normal affect  ASSESSMENT/PLAN:   Encounter for [employer] COVID vaccine administered x1 by Maree Erie, CMA. Patient tolerated it well and was observed for a minimum of 15 mins post-vaccination. HR elevated when rooming (likely nervous response w/ thinking about getting the vaccine). Normal RRR with cardiac exam post-vaccination.  -F/u in 21 days for 2nd dose  Essential hypertension SBP 150. Goal of <140/90. Will continue to monitor with subsequent visits. Continue current regimen.   Type 2 diabetes mellitus with complication (HCC) Continue current medication.  -F/u in 2 months for rpt A1c  Hyperlipidemia associated with type 2 diabetes mellitus (HCC) ASCVD of 19.7% suggest need for high intensity statin. -Increase Crestor to 40 mg daily   Lavonda Jumbo, DO [facility]

## 2020-01-05 DIAGNOSIS — Z23 Encounter for immunization: Secondary | ICD-10-CM | POA: Insufficient documentation

## 2020-01-05 NOTE — Assessment & Plan Note (Signed)
Continue current medication.  -F/u in 2 months for rpt A1c

## 2020-01-05 NOTE — Assessment & Plan Note (Addendum)
[employer] COVID vaccine administered x1 by Maree Erie, CMA. Patient tolerated it well and was observed for a minimum of 15 mins post-vaccination. HR elevated when rooming (likely nervous response w/ thinking about getting the vaccine). Normal RRR with cardiac exam post-vaccination.  -F/u in 21 days for 2nd dose

## 2020-01-05 NOTE — Assessment & Plan Note (Signed)
ASCVD of 19.7% suggest need for high intensity statin. -Increase Crestor to 40 mg daily

## 2020-01-05 NOTE — Assessment & Plan Note (Signed)
SBP 150. Goal of <140/90. Will continue to monitor with subsequent visits. Continue current regimen.

## 2020-01-21 ENCOUNTER — Ambulatory Visit: Payer: Medicaid Other | Admitting: Podiatry

## 2020-01-21 ENCOUNTER — Encounter: Payer: Self-pay | Admitting: Podiatry

## 2020-01-21 ENCOUNTER — Other Ambulatory Visit: Payer: Self-pay

## 2020-01-21 DIAGNOSIS — M79676 Pain in unspecified toe(s): Secondary | ICD-10-CM

## 2020-01-21 DIAGNOSIS — E1149 Type 2 diabetes mellitus with other diabetic neurological complication: Secondary | ICD-10-CM | POA: Diagnosis not present

## 2020-01-21 DIAGNOSIS — B351 Tinea unguium: Secondary | ICD-10-CM | POA: Diagnosis not present

## 2020-01-21 DIAGNOSIS — E1140 Type 2 diabetes mellitus with diabetic neuropathy, unspecified: Secondary | ICD-10-CM

## 2020-01-21 NOTE — Progress Notes (Signed)
Complaint:  Visit Type: Patient returns to my office for continued preventative foot care services. Complaint: Patient states" my nails have grown long and thick and become painful to walk and wear shoes" Patient has been diagnosed with DM with no foot complications. The patient presents for preventative foot care services. No changes to ROS  Podiatric Exam: Vascular: dorsalis pedis and posterior tibial pulses are palpable bilateral. Capillary return is immediate. Temperature gradient is WNL. Skin turgor WNL  Sensorium: Normal Semmes Weinstein monofilament test. Normal tactile sensation bilaterally. Nail Exam: Pt has thick disfigured discolored nails with subungual debris noted bilateral entire nail hallux through fifth toenails Ulcer Exam: There is no evidence of ulcer or pre-ulcerative changes or infection. Orthopedic Exam: Muscle tone and strength are WNL. No limitations in general ROM. No crepitus or effusions noted. Foot type and digits show no abnormalities. Bony prominences are unremarkable. Skin: No Porokeratosis. No infection or ulcers  Diagnosis:  Onychomycosis, , Pain in right toe, pain in left toes  Treatment & Plan Procedures and Treatment: Consent by patient was obtained for treatment procedures.   Debridement of mycotic and hypertrophic toenails, 1 through 5 bilateral and clearing of subungual debris using a nail nipper and then dremel tool. . No ulceration, no infection noted.  Return Visit-Office Procedure: Patient instructed to return to the office for a follow up visit 4 months for continued evaluation and treatment.    Eisha Chatterjee DPM 

## 2020-01-23 ENCOUNTER — Other Ambulatory Visit: Payer: Self-pay

## 2020-01-23 ENCOUNTER — Ambulatory Visit (INDEPENDENT_AMBULATORY_CARE_PROVIDER_SITE_OTHER): Payer: Medicaid Other

## 2020-01-23 DIAGNOSIS — Z23 Encounter for immunization: Secondary | ICD-10-CM

## 2020-01-23 NOTE — Progress Notes (Signed)
   Covid-19 Vaccination Clinic  Name:  DEMETRICE COMBES    MRN: [id number] DOB: 1975/06/09  01/23/2020  Patient presents to nurse clinic for second COVID vaccination. Patient denies previous serious allergic reaction to COVID vaccine and answers no to all screening questions except history of allergic reactions. Patient has documented allergies to shellfish and Vicodin. Administered in RD, site unremarkable, tolerated injection well.  Mr. Clasby was observed post Covid-19 immunization for 30 minutes based on pre-vaccination screening without incident. He was provided with Vaccine Information Sheet and instruction to access the V-Safe system.   Mr. Ozga was instructed to call 911 with any severe reactions post vaccine: Marland Kitchen Difficulty breathing  . Swelling of face and throat  . A fast heartbeat  . A bad rash all over body  . Dizziness and weakness     Patient provided with updated immunization record and card.   Veronda Prude, RN

## 2020-01-26 ENCOUNTER — Other Ambulatory Visit: Payer: Self-pay | Admitting: Family Medicine

## 2020-01-26 DIAGNOSIS — E119 Type 2 diabetes mellitus without complications: Secondary | ICD-10-CM

## 2020-01-26 DIAGNOSIS — Z794 Long term (current) use of insulin: Secondary | ICD-10-CM

## 2020-01-31 ENCOUNTER — Encounter: Payer: Self-pay | Admitting: Family Medicine

## 2020-01-31 ENCOUNTER — Other Ambulatory Visit: Payer: Self-pay | Admitting: Family Medicine

## 2020-02-16 ENCOUNTER — Other Ambulatory Visit: Payer: Self-pay

## 2020-02-16 DIAGNOSIS — E118 Type 2 diabetes mellitus with unspecified complications: Secondary | ICD-10-CM

## 2020-02-16 MED ORDER — ONETOUCH ULTRASOFT LANCETS MISC: 12 refills | Status: DC

## 2020-02-16 MED ORDER — BD PEN NEEDLE SHORT U/F 31G X 8 MM MISC
0 refills | Status: DC
Start: 2020-02-16 — End: 2020-06-21

## 2020-02-16 MED ORDER — VICTOZA 18 MG/3ML ~~LOC~~ SOPN: PEN_INJECTOR | SUBCUTANEOUS | 2 refills | Status: DC

## 2020-02-29 ENCOUNTER — Other Ambulatory Visit: Payer: Self-pay | Admitting: Family Medicine

## 2020-02-29 ENCOUNTER — Telehealth: Payer: Self-pay | Admitting: Family Medicine

## 2020-02-29 DIAGNOSIS — E118 Type 2 diabetes mellitus with unspecified complications: Secondary | ICD-10-CM

## 2020-02-29 MED ORDER — VICTOZA 18 MG/3ML ~~LOC~~ SOPN: PEN_INJECTOR | SUBCUTANEOUS | 2 refills | Status: DC

## 2020-02-29 MED ORDER — LANTUS SOLOSTAR 100 UNIT/ML ~~LOC~~ SOPN: 35.0000 [IU] | PEN_INJECTOR | Freq: Every day | SUBCUTANEOUS | 0 refills | Status: DC

## 2020-02-29 NOTE — Telephone Encounter (Signed)
Received OOH page from daughter who is patient's care giver. She said that pt has T2DM CBGs 200-300 over the last 2 days. He has run out of Victoza today and Lantus 3 days ago. His symptoms are shaking, drowsy, headache, dizzy on standing. He also neeeds assistance going to the restroom which started today which have worsened. Denies confusion, vomiting, abdominal pain, polyuria, polydipsia, chest pain and dyspnea etc. Per chart review pt was seen in July by PCP Dr Wynelle Link and Lantus was stopped. He is on Jardiance 10mg , victoza 1.8mg  and lantus was stopped. Per daughter pt has continued to take Lantus 40 units since the visit. He has difficulty understanding his diabetes and managing it and gets often gets confused with his meds. She also reports she is not allowed to come to his clinic visits due to the covid policy which makes it harder.  I recommended that patient should be taken to the ED for further evaluation to rule out other causes of his symptoms and for insulin administration if necessary. Booked follow up appointment at [facility] on Tues 19th Oct with Dr 21th Oct and refilled Lantus 35 units and Victoza 1.8mg   Pt's daughter expressed understanding.

## 2020-03-02 ENCOUNTER — Ambulatory Visit: Payer: Medicaid Other | Admitting: Family Medicine

## 2020-03-10 ENCOUNTER — Other Ambulatory Visit: Payer: Self-pay | Admitting: Family Medicine

## 2020-03-21 ENCOUNTER — Other Ambulatory Visit: Payer: Self-pay | Admitting: Family Medicine

## 2020-03-21 DIAGNOSIS — Z794 Long term (current) use of insulin: Secondary | ICD-10-CM

## 2020-03-21 DIAGNOSIS — E119 Type 2 diabetes mellitus without complications: Secondary | ICD-10-CM

## 2020-03-25 ENCOUNTER — Other Ambulatory Visit: Payer: Self-pay

## 2020-03-25 DIAGNOSIS — R519 Headache, unspecified: Secondary | ICD-10-CM | POA: Diagnosis present

## 2020-03-25 DIAGNOSIS — I10 Essential (primary) hypertension: Secondary | ICD-10-CM | POA: Diagnosis not present

## 2020-03-25 DIAGNOSIS — Z79899 Other long term (current) drug therapy: Secondary | ICD-10-CM | POA: Diagnosis not present

## 2020-03-25 DIAGNOSIS — Z794 Long term (current) use of insulin: Secondary | ICD-10-CM | POA: Insufficient documentation

## 2020-03-25 DIAGNOSIS — R000 Tachycardia, unspecified: Secondary | ICD-10-CM | POA: Insufficient documentation

## 2020-03-25 DIAGNOSIS — Z7984 Long term (current) use of oral hypoglycemic drugs: Secondary | ICD-10-CM | POA: Diagnosis not present

## 2020-03-25 DIAGNOSIS — M79602 Pain in left arm: Secondary | ICD-10-CM | POA: Insufficient documentation

## 2020-03-25 DIAGNOSIS — E119 Type 2 diabetes mellitus without complications: Secondary | ICD-10-CM | POA: Insufficient documentation

## 2020-03-25 NOTE — ED Triage Notes (Signed)
Pt reports left sided headache and neck, shoulder pain that has been consistent for 3 days. Pt denies injury. Recently started taking hydroxyzine. Pt tachycardic and says that is normal for him. PCP is trying to identify cause.

## 2020-03-26 ENCOUNTER — Encounter (HOSPITAL_COMMUNITY): Payer: Self-pay

## 2020-03-26 ENCOUNTER — Emergency Department (HOSPITAL_COMMUNITY)
Admission: EM | Admit: 2020-03-26 | Discharge: 2020-03-26 | Disposition: A | Discharge status: Home / Self Care | Payer: Medicaid Other | Attending: Emergency Medicine | Admitting: Emergency Medicine

## 2020-03-26 ENCOUNTER — Emergency Department (HOSPITAL_COMMUNITY): Payer: Medicaid Other

## 2020-03-26 DIAGNOSIS — R519 Headache, unspecified: Secondary | ICD-10-CM

## 2020-03-26 LAB — BASIC METABOLIC PANEL
Anion gap: 13 (ref 5–15)
BUN: 8 mg/dL (ref 6–20)
CO2: 22 mmol/L (ref 22–32)
Calcium: 9.4 mg/dL (ref 8.9–10.3)
Chloride: 102 mmol/L (ref 98–111)
Creatinine, Ser: 1.07 mg/dL (ref 0.61–1.24)
GFR, Estimated: >60 mL/min (ref >60)
Glucose, Bld: 155 mg/dL — ABNORMAL HIGH (ref 70–99)
Potassium: 3.9 mmol/L (ref 3.5–5.1)
Sodium: 137 mmol/L (ref 135–145)

## 2020-03-26 LAB — CBC
HCT: 38.4 % — ABNORMAL LOW (ref 39.0–52.0)
Hemoglobin: 12.6 g/dL — ABNORMAL LOW (ref 13.0–17.0)
MCH: 29.4 pg (ref 26.0–34.0)
MCHC: 32.8 g/dL (ref 30.0–36.0)
MCV: 89.7 fL (ref 80.0–100.0)
Platelets: 284 10*3/uL (ref 150–400)
RBC: 4.28 MIL/uL (ref 4.22–5.81)
RDW: 12 % (ref 11.5–15.5)
WBC: 7.2 10*3/uL (ref 4.0–10.5)
nRBC: 0 % (ref 0.0–0.2)

## 2020-03-26 MED ORDER — MAGNESIUM SULFATE 2 GM/50ML IV SOLN
2.0000 g | Freq: Once | INTRAVENOUS | Status: AC
  Administered 2020-03-26: 2 g via INTRAVENOUS
  Filled 2020-03-26: qty 50

## 2020-03-26 MED ORDER — IOHEXOL 350 MG/ML SOLN
100.0000 mL | Freq: Once | INTRAVENOUS | Status: AC | PRN
  Administered 2020-03-26: 100 mL via INTRAVENOUS

## 2020-03-26 MED ORDER — KETOROLAC TROMETHAMINE 15 MG/ML IJ SOLN
15.0000 mg | Freq: Once | INTRAMUSCULAR | Status: AC
  Administered 2020-03-26: 15 mg via INTRAVENOUS
  Filled 2020-03-26: qty 1

## 2020-03-26 MED ORDER — SODIUM CHLORIDE 0.9 % IV BOLUS
250.0000 mL | Freq: Once | INTRAVENOUS | Status: AC
  Administered 2020-03-26: 250 mL via INTRAVENOUS

## 2020-03-26 MED ORDER — SODIUM CHLORIDE (PF) 0.9 % IJ SOLN
INTRAMUSCULAR | Status: AC
  Filled 2020-03-26: qty 50

## 2020-03-26 NOTE — ED Notes (Signed)
Discharge paperwork reviewed with pt, pt with no questions or concerns at this time.  Ambulatory to ED exit at time of discharge.

## 2020-03-26 NOTE — ED Notes (Signed)
Patient returned from CT
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
## 2020-03-26 NOTE — ED Notes (Signed)
Patient transported to CT 

## 2020-03-26 NOTE — ED Provider Notes (Signed)
Care handoff received from Ivar Drape, PA-C at shift change, please see previous provider note for full details of visit.  In short 44 year old male presented with a 3-day new headache.  No meningismus or infectious symptoms.  Plan of care is to follow-up on the CT angio head and neck, if negative give migraine cocktail and discharge after improvement. Physical Exam  BP 130/87   Pulse 95   Temp 97.7 F (36.5 C)   Resp 16   Ht 5\' 7"  (1.702 m)   Wt 117.9 kg   SpO2 96%   BMI 40.72 kg/m   Physical Exam Constitutional:      General: He is not in acute distress.    Appearance: Normal appearance. He is well-developed. He is not ill-appearing or diaphoretic.  HENT:     Head: Normocephalic and atraumatic.  Eyes:     General: Vision grossly intact. Gaze aligned appropriately.     Pupils: Pupils are equal, round, and reactive to light.  Neck:     Trachea: Trachea and phonation normal.     Meningeal: Brudzinski's sign absent.  Pulmonary:     Effort: Pulmonary effort is normal. No respiratory distress.  Abdominal:     General: There is no distension.     Palpations: Abdomen is soft.     Tenderness: There is no abdominal tenderness. There is no guarding or rebound.  Musculoskeletal:        General: Normal range of motion.     Cervical back: Normal range of motion.  Skin:    General: Skin is warm and dry.  Neurological:     Mental Status: He is alert.     GCS: GCS eye subscore is 4. GCS verbal subscore is 5. GCS motor subscore is 6.     Comments: Speech is clear and goal oriented, follows commands Major Cranial nerves without deficit, no facial droop Normal strength in upper and lower extremities bilaterally including dorsiflexion and plantar flexion, strong and equal grip strength Sensation normal to light and sharp touch Moves extremities without ataxia, coordination intact Normal finger to nose and rapid alternating movements Neg romberg, no pronator drift Normal gait   Psychiatric:        Behavior: Behavior normal.    ED Course/Procedures   Clinical Course as of Mar 26 933  Fri Mar 26, 2020  Mar 28, 2020 2g magnesium, toradol, +- reglan   [BM]    Clinical Course User Index [BM] 9480, PA-C    Procedures   EXAM: CT ANGIOGRAPHY HEAD AND NECK  TECHNIQUE: Multidetector CT imaging of the head and neck was performed using the standard protocol during bolus administration of intravenous contrast. Multiplanar CT image reconstructions and MIPs were obtained to evaluate the vascular anatomy. Carotid stenosis measurements (when applicable) are obtained utilizing NASCET criteria, using the distal internal carotid diameter as the denominator.  CONTRAST: Bill [place] OMNIPAQUE IOHEXOL 350 MG/ML SOLN  COMPARISON: Head CT 08/01/2014  FINDINGS: CT HEAD FINDINGS  Brain: No evidence of acute infarction, hemorrhage, hydrocephalus, extra-axial collection or mass lesion/mass effect.  Vascular: See below  Skull: Normal. Negative for fracture or focal lesion.  Sinuses: Negative  Orbits: Negative  Review of the MIP images confirms the above findings  CTA NECK FINDINGS  Aortic arch: Normal. Three vessel branching.  Right carotid system: Vessels are smooth and widely patent. No atheromatous changes.  Left carotid system: Vessels are smooth and widely patent. Mild calcified atheromatous plaque at the left ICA bulb.  Vertebral arteries: Proximal subclavian  and bilateral vertebral arteries are smooth and widely patent.  Skeleton: Negative  Other neck: No incidental mass or inflammation.  Upper chest: Negative  Review of the MIP images confirms the above findings  CTA HEAD FINDINGS  Anterior circulation: Mild calcified plaque. No branch occlusion, beading, or aneurysm.  Posterior circulation: Mild calcified plaque in the V4 segments. No associated stenosis. Basilar and PCA vessels are smooth and widely patent  Venous sinuses:  Diffusely patent  Anatomic variants: None significant  Review of the MIP images confirms the above findings  IMPRESSION: 1. No emergent finding. 2. Mild atherosclerosis.   Electronically Signed By: Marnee Spring M.D. On: 03/26/2020 06:34  MDM  CTA Head/Neck: IMPRESSION: 1. No emergent finding. 2. Mild atherosclerosis.  CBC without leukocytosis to suggest infection, baseline anemia of 12.6. BMP without emergent electrolyte derangement, AKI or gap.  Vital signs reviewed patient tachycardic but this appears to be his baseline multiple visits. - Discussed case with attending physician Dr. Nicanor Alcon, plan of care is to treat headache with 2 g of magnesium, Toradol plus minus Reglan and when improved discharge.  Incidentally patient slightly tachycardic this is being worked up by his PCP, asymptomatic, and no indication for further ER work-up. - Patient reassessed, resting comfortably bed well-appearing no acute distress.  States understanding of CT findings above and aware to discuss them with his PCP at follow-up visit.  He reports he has a PCP follow-up visit this Monday 03/29/2020.  Patient is agreeable to treatment with above medications, plan for reassessment after headache improvement. - 9 AM: Patient reassessed after receiving a migraine cocktail.  He is sleeping in bed no acute distress easily arousable to voice.  Vital signs stable on room air tachycardia resolved.  Patient reports that headache is improved and he would like to be discharged.  Repeat neuro examination within normal limits patient appears stable for discharge with PCP follow-up.  He reports he has an appointment early this coming week.  At this time there does not appear to be any evidence of an acute emergency medical condition and the patient appears stable for discharge with appropriate outpatient follow up. Diagnosis was discussed with patient who verbalizes understanding of care plan and is agreeable to  discharge. I have discussed return precautions with patient who verbalizes understanding. Patient encouraged to follow-up with their PCP. All questions answered.   Note: Portions of this report may have been transcribed using voice recognition software. Every effort was made to ensure accuracy; however, inadvertent computerized transcription errors may still be present.    Elizabeth Palau 03/26/20 0935    Palumbo, April, MD 03/26/20 2304

## 2020-03-26 NOTE — Discharge Instructions (Addendum)
At this time there does not appear to be the presence of an emergent medical condition, however there is always the potential for conditions to change. Please read and follow the below instructions.  Please return to the Emergency Department immediately for any new or worsening symptoms. Please be sure to follow up with your Primary Care Provider within one week regarding your visit today; please call their office to schedule an appointment even if you are feeling better for a follow-up visit. As we discussed your CT scan today showed some atherosclerosis of the arteries of your head and neck please discuss this with your primary care provider at your follow-up visit.  Go to the nearest Emergency Department immediately if: You have fever or chills Your headache gets very bad quickly. Your headache gets worse after a lot of physical activity. You keep throwing up. You have a stiff neck. You have trouble seeing. You have trouble speaking. You have pain in the eye or ear. Your muscles are weak or you lose muscle control. You lose your balance or have trouble walking. You feel like you will pass out (faint) or you pass out. You are mixed up (confused). You have a seizure. You have any new/concerning or worsening of symptoms  Please read the additional information packets attached to your discharge summary.  Do not take your medicine if  develop an itchy rash, swelling in your mouth or lips, or difficulty breathing; call 911 and seek immediate emergency medical attention if this occurs.  You may review your lab tests and imaging results in their entirety on your MyChart account.  Please discuss all results of fully with your primary care provider and other specialist at your follow-up visit.  Note: Portions of this text may have been transcribed using voice recognition software. Every effort was made to ensure accuracy; however, inadvertent computerized transcription errors may still be  present.

## 2020-03-29 ENCOUNTER — Ambulatory Visit (INDEPENDENT_AMBULATORY_CARE_PROVIDER_SITE_OTHER): Payer: Medicaid Other | Admitting: Family Medicine

## 2020-03-29 ENCOUNTER — Encounter: Payer: Self-pay | Admitting: Family Medicine

## 2020-03-29 ENCOUNTER — Telehealth: Payer: Self-pay | Admitting: Family Medicine

## 2020-03-29 ENCOUNTER — Other Ambulatory Visit: Payer: Self-pay

## 2020-03-29 ENCOUNTER — Telehealth: Payer: Self-pay

## 2020-03-29 VITALS — BP 122/90 | HR 100 | Ht 67.0 in | Wt 267.0 lb

## 2020-03-29 DIAGNOSIS — E118 Type 2 diabetes mellitus with unspecified complications: Secondary | ICD-10-CM | POA: Diagnosis present

## 2020-03-29 DIAGNOSIS — I10 Essential (primary) hypertension: Secondary | ICD-10-CM | POA: Diagnosis not present

## 2020-03-29 DIAGNOSIS — G43109 Migraine with aura, not intractable, without status migrainosus: Secondary | ICD-10-CM

## 2020-03-29 DIAGNOSIS — G43409 Hemiplegic migraine, not intractable, without status migrainosus: Secondary | ICD-10-CM

## 2020-03-29 DIAGNOSIS — G43909 Migraine, unspecified, not intractable, without status migrainosus: Secondary | ICD-10-CM | POA: Insufficient documentation

## 2020-03-29 LAB — POCT GLYCOSYLATED HEMOGLOBIN (HGB A1C): Hemoglobin A1C: 11.2 % — AB (ref 4.0–5.6)

## 2020-03-29 MED ORDER — EMPAGLIFLOZIN 25 MG PO TABS: 25.0000 mg | ORAL_TABLET | Freq: Every day | ORAL | 3 refills | Status: DC

## 2020-03-29 MED ORDER — SUMATRIPTAN SUCCINATE 25 MG PO TABS: 25.0000 mg | ORAL_TABLET | ORAL | 0 refills | Status: DC | PRN

## 2020-03-29 MED ORDER — CHLORTHALIDONE 25 MG PO TABS: 25.0000 mg | ORAL_TABLET | Freq: Every day | ORAL | 3 refills | Status: DC

## 2020-03-29 NOTE — Progress Notes (Signed)
    SUBJECTIVE:   CHIEF COMPLAINT / HPI:   Diabetes Current medicine includes -metformin 1000 MG BID -jardiance 10 mg -liraglutide 1.8 -lantus 35 mg  -Lisinopril 40 mg -Rosuvastatin 40 mg Fasting blod sugars usually around 220-250s  Hypertension His current medicine includes: -Amlodipine 10 mg daily -Lisinopril 40 mg daily  Migraine He is seen in the emergency department 3 days ago for a left-sided headache and associated left arm numbness.  These headaches are not associated with vision changes but were associated with nausea.  During his time in the ED, he was worked up with a CTA head which showed no abnormalities.  Ultimately, his symptoms improved with Toradol and Reglan.  He continues to have a localized left-sided headache without vision changes.  He denies current left arm numbness or weakness.  PERTINENT  PMH / PSH: Hypertension, obesity, diabetes  OBJECTIVE:   BP 122/90   Pulse 100 Comment: recheck  Ht 5\' 7"  (1.702 m)   Wt 267 lb (121.1 kg)   SpO2 98%   BMI 41.82 kg/m    General: Alert and cooperative and appears to be in no acute distress Respiratory: Breathing comfortably on room air.  No respiratory distress. HEENT: Moist mucous membranes. Neuro: Cranial nerves II-XII intact.  5/5 strength for upper extremity elbow flexion, extension and grip strength bilaterally.  No decreased sensation to light touch in upper extremities.  ASSESSMENT/PLAN:   Essential hypertension Poorly controlled at today's visit. -Continue amlodipine and lisinopril -Start chlorthalidone 25 mg -Return for lab visit in 1 week.  Type 2 diabetes mellitus with complication (HCC) Poorly controlled.  Medications reviewed today. -Increase Lantus from 35 to 40 units daily -Increase empagliflozin to 25 mg daily -Continue Metformin and liraglutide at current dose. -Return to clinic in 1 month  Migraine History and work-up are consistent with a migraine.  He was encouraged to start  taking sumatriptan as an abortive medicine for these headaches. -Sumatriptan 25 mg as needed for migraines, redose if no change in 1-2 hours -Return to clinic in 1 month for reassessment     , MD [facility]

## 2020-03-29 NOTE — Assessment & Plan Note (Signed)
Poorly controlled.  Medications reviewed today. -Increase Lantus from 35 to 40 units daily -Increase empagliflozin to 25 mg daily -Continue Metformin and liraglutide at current dose. -Return to clinic in 1 month

## 2020-03-29 NOTE — Assessment & Plan Note (Signed)
History and work-up are consistent with a migraine.  He was encouraged to start taking sumatriptan as an abortive medicine for these headaches. -Sumatriptan 25 mg as needed for migraines, redose if no change in 1-2 hours -Return to clinic in 1 month for reassessment

## 2020-03-29 NOTE — Patient Instructions (Signed)
Diabetes: Your diabetes is pretty poorly controlled today.  Lets make the following changes and bring you back in 1 month: -Increase your insulin from 35 to 40 units daily -Increase your semaglutide to 25 mg daily.  I have sent a new prescription for this.  Migraines: I sent in a medication called sumatriptan.  You can take this medication for bad headaches especially when it is associated with her arm numbness.  If your headache persists after 1-2 hours.  Take another tablet.  Come back in 1 month to reassess.

## 2020-03-29 NOTE — Telephone Encounter (Signed)
Received phone call from patient regarding medications. Medications are requiring attending authorization for all three medications sent over today (chlorthalidone, jardiance and imitrex).   Will forward to precepting physician.   Veronda Prude, RN

## 2020-03-29 NOTE — Telephone Encounter (Signed)
Patient's daughter called the after-hours line due to issues with prescriptions.  Reported that she was able to fill 2 of the 3 prescriptions that he received today but was unable to fill the prescription for sumatriptan because it needed prior authorization.  Note forwarded to nurse team for prior authorization.

## 2020-03-29 NOTE — Telephone Encounter (Signed)
Rx to pharmacy. Michale Weikel, MD  Family Medicine Teaching Service   

## 2020-03-29 NOTE — Assessment & Plan Note (Signed)
Poorly controlled at today's visit. -Continue amlodipine and lisinopril -Start chlorthalidone 25 mg -Return for lab visit in 1 week.

## 2020-03-30 ENCOUNTER — Telehealth: Payer: Self-pay

## 2020-03-30 NOTE — Telephone Encounter (Signed)
Completed PA info in [facility] Tracks for Sumatriptan. Status pending. Will recheck status in 24 hours.

## 2020-03-30 NOTE — Telephone Encounter (Signed)
Patient calls nurse line regarding continued left sided arm numbness and weakness. Patient reports onset over one week ago. Patient denies facial drooping, speech difficulties, and has normal gait and balance. Denies SHOB and chest pain. Per OV note on 11/15, patient had reassuring CTA head on 11/12. Patient does not appear to be in acute distress and is requesting to schedule office visit follow up tomorrow. Patient is still waiting for insurance approval for new medication, sumatriptan. Scheduled with Dr. Obie Dredge tomorrow morning for follow up.   Strict ED precautions given.   Veronda Prude, RN

## 2020-03-31 ENCOUNTER — Other Ambulatory Visit: Payer: Self-pay

## 2020-03-31 ENCOUNTER — Encounter: Payer: Self-pay | Admitting: Family Medicine

## 2020-03-31 ENCOUNTER — Ambulatory Visit (INDEPENDENT_AMBULATORY_CARE_PROVIDER_SITE_OTHER): Payer: Medicaid Other | Admitting: Family Medicine

## 2020-03-31 VITALS — BP 134/86 | HR 117 | Wt 265.0 lb

## 2020-03-31 DIAGNOSIS — G43409 Hemiplegic migraine, not intractable, without status migrainosus: Secondary | ICD-10-CM

## 2020-03-31 MED ORDER — METOCLOPRAMIDE HCL 5 MG PO TABS: 5.0000 mg | ORAL_TABLET | Freq: Four times a day (QID) | ORAL | 0 refills | Status: DC | PRN

## 2020-03-31 NOTE — Progress Notes (Signed)
SUBJECTIVE:   CHIEF COMPLAINT / HPI:   Left Arm Numbness and Left Sided Headache Patient called on 11/16 reporting 1 week of left arm numbness and left headache Has been having pain in the left side of his head that continues to come and go Comes and goes everyday Mostly behind his left ear and on the left upper part of his head He states that the pain feels like "blood flowing" and numbness He also feels left arm numbness, but no weakness He went to the ED on 11/12, had CTA head and neck that was negative for acute pathology, showed mild atherosclerosis He was seen for the same on 11/15 by Dr. Homero Fellers and diagnosed with migraine He was given toradol and reglan that improved his symptoms in ED He was ordered sumitriptan, but medicaid needs prior auth  Wife on phone states that he has been complaining of head hurting and being very fatigued in the last week  At present, he has pain in the left side of his head and numbness down his arm He has not felt anything in his leg Hasn't tried any over the counter medications  Reports that his sugars has been elevated Monday his A1c was 11.2 His insulin was increased, sugars have been improving He is to come back for labs next week  PERTINENT  PMH / PSH: HTN, T2DM, hyperparathyroidism, HLD, morbid obesity, bipolar disorder  OBJECTIVE:   BP 134/86   Pulse (!) 117   Wt 265 lb (120.2 kg)   SpO2 97%   BMI 41.50 kg/m    Physical Exam:  General: 44 y.o. male in NAD HEENT: PERRL, EOMI, TTP along left temporal bone and origin of trapezius at left occiput Neck: Full ROM in all fields, pain with spurlings b/l but no radicular symptoms Cardio: RRR no m/r/g Lungs: CTAB, no wheezing, no rhonchi, no crackles, no IWOB on RA Skin: warm and dry Extremities: 5/5 strength BUE/BLE Neuro: CN II-XII intact, sensation intact throughout, 1+ DTR biceps, triceps, patellar reflexes b/l   ASSESSMENT/PLAN:   Migraine Agree that patient symptoms are  consistent with a migraine.  He is neurologically intact on exam and has no differences in sensation in his upper extremities.  He had had negative CTA head and neck in ED on 11/12.  His symptoms come and go.  He has tenderness over his head and at the origin of trapezius on the left.  The swollen to a headache causing his symptoms.  He does not have weakness in his arm and does not seem to have radicular symptoms from neck testing, therefore this is unlikely coming from his neck, and also unlikely to have a stroke.  He is high risk for stroke given his significant risk factors including hypertension, hyperlipidemia, uncontrolled diabetes.  His uncontrolled diabetes could also be contributing to his symptoms.  He has not tried anything over-the-counter for this, therefore will start with extra strength Tylenol, caffeine, and then if he does not have improvement can do Reglan as needed.  He was given 10 tablets of this.  Last EKG was in December 2020 and QTC was within normal limits.  Given return precautions including worsening of symptoms, focal weakness, inability to improve with conservative measures.  He will follow-up in 1 week with his PCP to ensure the symptoms improve.  Checked and ensure that sumatriptan is on Medicaid preferred list, advised that he should be able to obtain this from the pharmacy, but if he cannot to call the  office.  Advised to not use sumatriptan until he tries these conservative measures as described above.  Precepted with Dr. Myriam Jacobson.     Marcus Jim, DO [facility]

## 2020-03-31 NOTE — Telephone Encounter (Signed)
Medicaid denied prior authorization for Sumatriptan.

## 2020-03-31 NOTE — Patient Instructions (Signed)
Thank you for coming to see me today. It was a pleasure. Today we talked about:   You can take two extra strength tylenol and caffeine when you get a headache.  Heating pads on your neck can also help.  If you do these things and it doesn't help, you can try reglan.    If you start having worsening of pain, numbness, or weakness, you should be seen right away.  Please follow-up with PCP in 1 week.  If you have any questions or concerns, please do not hesitate to call the office at [phone].  Best,   Luis Abed, DO

## 2020-03-31 NOTE — Assessment & Plan Note (Signed)
Agree that patient symptoms are consistent with a migraine.  He is neurologically intact on exam and has no differences in sensation in his upper extremities.  He had had negative CTA head and neck in ED on 11/12.  His symptoms come and go.  He has tenderness over his head and at the origin of trapezius on the left.  The swollen to a headache causing his symptoms.  He does not have weakness in his arm and does not seem to have radicular symptoms from neck testing, therefore this is unlikely coming from his neck, and also unlikely to have a stroke.  He is high risk for stroke given his significant risk factors including hypertension, hyperlipidemia, uncontrolled diabetes.  His uncontrolled diabetes could also be contributing to his symptoms.  He has not tried anything over-the-counter for this, therefore will start with extra strength Tylenol, caffeine, and then if he does not have improvement can do Reglan as needed.  He was given 10 tablets of this.  Last EKG was in December 2020 and QTC was within normal limits.  Given return precautions including worsening of symptoms, focal weakness, inability to improve with conservative measures.  He will follow-up in 1 week with his PCP to ensure the symptoms improve.  Checked and ensure that sumatriptan is on Medicaid preferred list, advised that he should be able to obtain this from the pharmacy, but if he cannot to call the office.  Advised to not use sumatriptan until he tries these conservative measures as described above.  Precepted with Dr. Myriam Jacobson.

## 2020-04-04 ENCOUNTER — Telehealth: Payer: Self-pay | Admitting: Family Medicine

## 2020-04-04 NOTE — Telephone Encounter (Signed)
Patient's wife called the after-hours call line.  She is concerned because he is still having a headache.  Patient reports that the headache is the exact same headache he was having when he came in for his visit last week and that he has tried the medications prescribed and they are still not helping.  He would like to be scheduled for a follow-up visit sooner than 11/29.  Denies any new weakness, slurred speech.  Patient's wife reports she also feels he has been more tired over the last few weeks.  I discussed blood sugars and she reports she has not taken it today because her blood sugar meter has broken because it got water on it.  She also has questions regarding if he should be taking the Jardiance and Metformin at the same time.  Discussed that yes he should be taking both and recommended continuation of over-the-counter medications as well as prescribed medications for his possible migraine.  I scheduled him for an appointment with access to care on 11/22.  Patient was given strict ED precautions regarding new onset weakness, worsening of headache, changes in vision, slurred speech.  Patient is agreeable and understands.

## 2020-04-04 NOTE — Progress Notes (Signed)
    SUBJECTIVE:   CHIEF COMPLAINT / HPI: migraine  Patient continues to report shooting pains in his head despite taking imitrex with onset of migraine.  Patient reports having shooting pains in his head on the left side. Pain starts near middle of head and radiates to behind his ear. He reports that he is concerned about a stroke causing his symptoms. He has some left upper extremity weakness that is mild. The episodes usually bother him during the evening when he has to move around at night. Patient denies being told he has been slurring his speech. He reports feeling tired. He has not had a stroke before. He does report some nausea but thinks this could be due to his changes in his medications including Jardiance and a new blood pressure medication. Pain sometimes wakes him from sleep. He does not measure blood pressure at home. Denies lacrimation with HA and is unsure of any redness of his eyes during episodes. He tried the imitrex with onset of pain and it helped for ~ 8 hours and then pain restarted with shooting pain.   Patient had CT angio on 11/12 with no acute findings.   PERTINENT  PMH / PSH: migraines , DM , HTN   OBJECTIVE:   BP 140/80   Pulse (!) 116   Ht 5\' 7"  (1.702 m)   Wt 252 lb 4 oz (114.4 kg)   SpO2 97%   BMI 39.51 kg/m    General: male appearing stated age in NAD sitting upright in exam room Neuro Exam: no conjunctival injection, PERRLA, EOMI bilaterally, normal strength 5/5 in bilateral upper and lower extremities, no tenderness to palpation in areas of reported head pains  ASSESSMENT/PLAN:   Migraine Patient's new characteristic of headache including sharp pains with paresthesias raises some concern that patient would benefit from repeat imaging, MRI this time. -Head MRI scheduled for patient -We will follow results in further treatment when available.  Essential hypertension Patient requesting to have BMP collected today as previous provider recommended to  have lab work 1 week after appointment -BMP     , MD [facility]

## 2020-04-05 ENCOUNTER — Ambulatory Visit (INDEPENDENT_AMBULATORY_CARE_PROVIDER_SITE_OTHER): Payer: Medicaid Other | Admitting: Family Medicine

## 2020-04-05 ENCOUNTER — Other Ambulatory Visit: Payer: Self-pay

## 2020-04-05 VITALS — BP 140/80 | HR 116 | Ht 67.0 in | Wt 252.2 lb

## 2020-04-05 DIAGNOSIS — I10 Essential (primary) hypertension: Secondary | ICD-10-CM | POA: Diagnosis not present

## 2020-04-05 DIAGNOSIS — G43409 Hemiplegic migraine, not intractable, without status migrainosus: Secondary | ICD-10-CM | POA: Diagnosis present

## 2020-04-05 NOTE — Assessment & Plan Note (Signed)
Patient's new characteristic of headache including sharp pains with paresthesias raises some concern that patient would benefit from repeat imaging, MRI this time. -Head MRI scheduled for patient -We will follow results in further treatment when available.

## 2020-04-05 NOTE — Patient Instructions (Addendum)
We will order a MRI of your head to scan for any structural changes that could be causing your headache.

## 2020-04-05 NOTE — Assessment & Plan Note (Signed)
Patient requesting to have BMP collected today as previous provider recommended to have lab work 1 week after appointment -BMP

## 2020-04-06 LAB — BASIC METABOLIC PANEL
BUN/Creatinine Ratio: 10 (ref 9–20)
BUN: 13 mg/dL (ref 6–24)
CO2: 21 mmol/L (ref 20–29)
Calcium: 10.4 mg/dL — ABNORMAL HIGH (ref 8.7–10.2)
Chloride: 91 mmol/L — ABNORMAL LOW (ref 96–106)
Creatinine, Ser: 1.31 mg/dL — ABNORMAL HIGH (ref 0.76–1.27)
GFR calc Af Amer: 75 mL/min/{1.73_m2} (ref >59)
GFR calc non Af Amer: 65 mL/min/{1.73_m2} (ref >59)
Glucose: 290 mg/dL — ABNORMAL HIGH (ref 65–99)
Potassium: 4.4 mmol/L (ref 3.5–5.2)
Sodium: 130 mmol/L — ABNORMAL LOW (ref 134–144)

## 2020-04-08 ENCOUNTER — Telehealth: Payer: Self-pay | Admitting: Family Medicine

## 2020-04-08 ENCOUNTER — Encounter (HOSPITAL_COMMUNITY): Payer: Self-pay | Admitting: Emergency Medicine

## 2020-04-08 ENCOUNTER — Emergency Department (HOSPITAL_COMMUNITY)
Admission: EM | Admit: 2020-04-08 | Discharge: 2020-04-09 | Disposition: A | Discharge status: Home / Self Care | Payer: Medicaid Other | Attending: Emergency Medicine | Admitting: Emergency Medicine

## 2020-04-08 ENCOUNTER — Other Ambulatory Visit: Payer: Self-pay

## 2020-04-08 DIAGNOSIS — Z79899 Other long term (current) drug therapy: Secondary | ICD-10-CM | POA: Diagnosis not present

## 2020-04-08 DIAGNOSIS — I10 Essential (primary) hypertension: Secondary | ICD-10-CM | POA: Diagnosis not present

## 2020-04-08 DIAGNOSIS — E1169 Type 2 diabetes mellitus with other specified complication: Secondary | ICD-10-CM | POA: Diagnosis not present

## 2020-04-08 DIAGNOSIS — Z7984 Long term (current) use of oral hypoglycemic drugs: Secondary | ICD-10-CM | POA: Insufficient documentation

## 2020-04-08 DIAGNOSIS — R000 Tachycardia, unspecified: Secondary | ICD-10-CM | POA: Insufficient documentation

## 2020-04-08 DIAGNOSIS — Z7982 Long term (current) use of aspirin: Secondary | ICD-10-CM | POA: Diagnosis not present

## 2020-04-08 DIAGNOSIS — K921 Melena: Secondary | ICD-10-CM | POA: Diagnosis present

## 2020-04-08 DIAGNOSIS — K602 Anal fissure, unspecified: Secondary | ICD-10-CM | POA: Diagnosis not present

## 2020-04-08 DIAGNOSIS — Z794 Long term (current) use of insulin: Secondary | ICD-10-CM | POA: Insufficient documentation

## 2020-04-08 LAB — CBC
HCT: 42 % (ref 39.0–52.0)
Hemoglobin: 13.5 g/dL (ref 13.0–17.0)
MCH: 28.5 pg (ref 26.0–34.0)
MCHC: 32.1 g/dL (ref 30.0–36.0)
MCV: 88.6 fL (ref 80.0–100.0)
Platelets: 387 10*3/uL (ref 150–400)
RBC: 4.74 MIL/uL (ref 4.22–5.81)
RDW: 11.8 % (ref 11.5–15.5)
WBC: 8.3 10*3/uL (ref 4.0–10.5)
nRBC: 0 % (ref 0.0–0.2)

## 2020-04-08 MED ORDER — POLYETHYLENE GLYCOL 3350 17 GM/SCOOP PO POWD: ORAL | 0 refills | Status: DC

## 2020-04-08 NOTE — ED Triage Notes (Signed)
Pt reports bright red blood in his stool that started yesterday after he took "some medication."  Reports prior to that he had been constipated, w/ no bowel movement in a week.

## 2020-04-08 NOTE — Telephone Encounter (Signed)
**  [facility] After Hours Emergency Line Call**  Mrs. Jolliff, patient's wife, called into her after-hours line for her husband due to concern of rectal bleeding.  She reports that he has noticed dark red blood mixed in with the stool for the past week and then tonight noted a larger amount than previous.  Reports he is otherwise been well.  States that she is not with him currently and that he has already gone to the ED.  Agree with ED evaluation.  Allayne Stack, DO
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
## 2020-04-09 LAB — COMPREHENSIVE METABOLIC PANEL
ALT: 24 U/L (ref 0–44)
AST: 27 U/L (ref 15–41)
Albumin: 3.7 g/dL (ref 3.5–5.0)
Alkaline Phosphatase: 60 U/L (ref 38–126)
Anion gap: 16 — ABNORMAL HIGH (ref 5–15)
BUN: 18 mg/dL (ref 6–20)
CO2: 23 mmol/L (ref 22–32)
Calcium: 9.5 mg/dL (ref 8.9–10.3)
Chloride: 94 mmol/L — ABNORMAL LOW (ref 98–111)
Creatinine, Ser: 1.63 mg/dL — ABNORMAL HIGH (ref 0.61–1.24)
GFR, Estimated: 53 mL/min — ABNORMAL LOW (ref >60)
Glucose, Bld: 293 mg/dL — ABNORMAL HIGH (ref 70–99)
Potassium: 4 mmol/L (ref 3.5–5.1)
Sodium: 133 mmol/L — ABNORMAL LOW (ref 135–145)
Total Bilirubin: 0.7 mg/dL (ref 0.3–1.2)
Total Protein: 7.6 g/dL (ref 6.5–8.1)

## 2020-04-09 NOTE — ED Notes (Signed)
DC instructions reviewed with patient and patient verbalized understanding. Patient vss and ambulatory for discharge.

## 2020-04-11 NOTE — Progress Notes (Signed)
    SUBJECTIVE:   CHIEF COMPLAINT / HPI:   ED f/u Seen in ED on 11/15 for BRBPR Hgb stable Had constipation at the time and anal fissure Also advised that he likely had internal hemorrhoids He has not previously had a colonoscopy This has now resolved No pain with BMs Was put on stool softeners, miralax which is helping  Headache follow up Patient has been seen multiple times for left sided throbbing headache, also had CTA head in ED on 11/12 MRI brain ordered by provider on 11/22, scheduled for 12/1 Uses sumatriptan, helps, but needs more States he is not taking more than two in a day Headaches improved with medication, otherwise symptoms seem to have resolved He notes that he had talked with previous provider about possible neurology referral after MRI  AKI BMP on 11/22 showed elevated Ca and Cr Recheck on 11/25 shows resolved hypercalcemia and continuously increasing Cr to 1.63 (up from 1.31 on 11/22), creatinine was 1.07 on 11/12 Peeing normally, drinking water  T2DM Current regimen: Jardiance 25mg , Metformin 1000mg  BID, Victoza 1.8mg  QD, Lantus 40u Increased Lantus to 40u 3 weeks ago Reports compliance  Overall feeling better CBGs consistently in 200s in last week Last A1c 11.2 on 11/15  PERTINENT  PMH / PSH: HTN, T2DM, hyperparathyroidism, HLD, morbid obesity, bipolar disorder  OBJECTIVE:   BP 102/70   Pulse (!) 110   Ht 5\' 7"  (1.702 m)   Wt 262 lb 2 oz (118.9 kg)   SpO2 98%   BMI 41.05 kg/m    Physical Exam:  General: 44 y.o. male in NAD Lungs: Breathing comfortably on room air Abdomen: Soft, non-tender to palpation, non-distended, positive bowel sounds Skin: warm and dry Extremities: No edema, ambulating without difficulty   ASSESSMENT/PLAN:   Hematochezia Rectal exam performed in the ED, diagnosed with fissure, patient symptoms have now resolved.  He was also diagnosed with constipation and was put on a bowel regimen, he states that his stools have  been soft and not painful.  He has never had a colonoscopy, therefore will place referral for this for hematochezia and colon cancer screening purposes.  He was given colon cancer screening paper to schedule.  No need for CBC today given hemoglobin was stable in ED and symptoms have now resolved.  Migraine Overall his symptoms seem to be improving.  He is scheduled for an MRI on 12/1.  Refilled sumatriptan, again reiterated that he cannot take more than 2 in a day.  Follow-up after MRI.  AKI (acute kidney injury) (HCC) Creatinine elevated, baseline appears to be around 1.  His creatinine was 1.63 on 11/25.  Advised increase fluid intake.  We will also recheck a BMP today.  Type 2 diabetes mellitus with complication (HCC) CBGs are still not well controlled.  We will go ahead and increase his Lantus to 45 units daily.  Continue with other current regimen.  Have him follow-up in 1 week.  He also reports that he recently dropped his meter and water and needs a new one.  Accu-Chek guide sent in again.     53, DO [facility]

## 2020-04-12 ENCOUNTER — Encounter: Payer: Self-pay | Admitting: Family Medicine

## 2020-04-12 ENCOUNTER — Other Ambulatory Visit: Payer: Self-pay

## 2020-04-12 ENCOUNTER — Ambulatory Visit (INDEPENDENT_AMBULATORY_CARE_PROVIDER_SITE_OTHER): Payer: Medicaid Other | Admitting: Family Medicine

## 2020-04-12 VITALS — BP 102/70 | HR 110 | Ht 67.0 in | Wt 262.1 lb

## 2020-04-12 DIAGNOSIS — G43109 Migraine with aura, not intractable, without status migrainosus: Secondary | ICD-10-CM | POA: Diagnosis not present

## 2020-04-12 DIAGNOSIS — Z1211 Encounter for screening for malignant neoplasm of colon: Secondary | ICD-10-CM

## 2020-04-12 DIAGNOSIS — E118 Type 2 diabetes mellitus with unspecified complications: Secondary | ICD-10-CM | POA: Diagnosis not present

## 2020-04-12 DIAGNOSIS — K921 Melena: Secondary | ICD-10-CM

## 2020-04-12 DIAGNOSIS — N179 Acute kidney failure, unspecified: Secondary | ICD-10-CM | POA: Diagnosis not present

## 2020-04-12 DIAGNOSIS — N183 Chronic kidney disease, stage 3 unspecified: Secondary | ICD-10-CM | POA: Insufficient documentation

## 2020-04-12 MED ORDER — LANTUS SOLOSTAR 100 UNIT/ML ~~LOC~~ SOPN: 45.0000 [IU] | PEN_INJECTOR | Freq: Every day | SUBCUTANEOUS | 0 refills | Status: DC

## 2020-04-12 MED ORDER — SUMATRIPTAN SUCCINATE 25 MG PO TABS: 25.0000 mg | ORAL_TABLET | ORAL | 0 refills | Status: DC | PRN

## 2020-04-12 MED ORDER — ACCU-CHEK GUIDE W/DEVICE KIT: PACK | 0 refills | Status: DC

## 2020-04-12 NOTE — Assessment & Plan Note (Signed)
Creatinine elevated, baseline appears to be around 1.  His creatinine was 1.63 on 11/25.  Advised increase fluid intake.  We will also recheck a BMP today.

## 2020-04-12 NOTE — Assessment & Plan Note (Signed)
Overall his symptoms seem to be improving.  He is scheduled for an MRI on 12/1.  Refilled sumatriptan, again reiterated that he cannot take more than 2 in a day.  Follow-up after MRI.

## 2020-04-12 NOTE — Assessment & Plan Note (Addendum)
CBGs are still not well controlled.  We will go ahead and increase his Lantus to 45 units daily.  Continue with other current regimen.  Have him follow-up in 1 week.  He also reports that he recently dropped his meter and water and needs a new one.  Accu-Chek guide sent in again.

## 2020-04-12 NOTE — Assessment & Plan Note (Addendum)
Rectal exam performed in the ED, diagnosed with fissure, patient symptoms have now resolved.  He was also diagnosed with constipation and was put on a bowel regimen, he states that his stools have been soft and not painful.  He has never had a colonoscopy, therefore will place referral for this for hematochezia and colon cancer screening purposes.  He was given colon cancer screening paper to schedule.  No need for CBC today given hemoglobin was stable in ED and symptoms have now resolved.

## 2020-04-12 NOTE — Patient Instructions (Signed)
Thank you for coming to see me today. It was a pleasure. Today we talked about:   You are due for a colonoscopy.  Please use the form that we have given you to schedule this at your convenience.   We will get some labs today.  If they are abnormal or we need to do something about them, I will call you.  If they are normal, I will send you a message on MyChart (if it is active) or a letter in the mail.  If you don't hear from [place] in 2 weeks, please call the office at the number below.  Make sure you stay well hydrated.  Increase your Lantus to 45 units daily.  I sent in refills.  Go for your MRI on 12/1.  Please follow-up with PCP in 1 week.  If you have any questions or concerns, please do not hesitate to call the office at [phone].  Best,   Luis Abed, DO

## 2020-04-13 LAB — BASIC METABOLIC PANEL
BUN/Creatinine Ratio: 12 (ref 9–20)
BUN: 16 mg/dL (ref 6–24)
CO2: 21 mmol/L (ref 20–29)
Calcium: 9.6 mg/dL (ref 8.7–10.2)
Chloride: 96 mmol/L (ref 96–106)
Creatinine, Ser: 1.33 mg/dL — ABNORMAL HIGH (ref 0.76–1.27)
GFR calc Af Amer: 74 mL/min/{1.73_m2} (ref >59)
GFR calc non Af Amer: 64 mL/min/{1.73_m2} (ref >59)
Glucose: 305 mg/dL — ABNORMAL HIGH (ref 65–99)
Potassium: 4 mmol/L (ref 3.5–5.2)
Sodium: 134 mmol/L (ref 134–144)

## 2020-04-14 ENCOUNTER — Other Ambulatory Visit: Payer: Self-pay

## 2020-04-14 ENCOUNTER — Ambulatory Visit (HOSPITAL_COMMUNITY)
Admission: RE | Admit: 2020-04-14 | Discharge: 2020-04-14 | Disposition: A | Discharge status: Home / Self Care | Payer: Medicaid Other | Source: Ambulatory Visit | Attending: Family Medicine | Admitting: Family Medicine

## 2020-04-14 DIAGNOSIS — G43409 Hemiplegic migraine, not intractable, without status migrainosus: Secondary | ICD-10-CM | POA: Diagnosis present

## 2020-04-14 MED ORDER — GADOBUTROL 1 MMOL/ML IV SOLN
10.0000 mL | Freq: Once | INTRAVENOUS | Status: AC | PRN
  Administered 2020-04-14: 10 mL via INTRAVENOUS

## 2020-04-16 ENCOUNTER — Other Ambulatory Visit: Payer: Self-pay | Admitting: Family Medicine

## 2020-04-20 ENCOUNTER — Ambulatory Visit: Payer: Medicaid Other | Admitting: Family Medicine

## 2020-04-20 NOTE — Progress Notes (Deleted)
    SUBJECTIVE:   CHIEF COMPLAINT / HPI:   T2DM Current regimen: Lantus 45 units daily, Jardiance 25 mg, Metformin 1000 g twice daily, Victoza 1.8 mg daily Increased Lantus to 45 units from 40 units on 11/29 CBG*** Last A1c 11.2 on 11/15  Hematochezia Resolved*** Hemoglobin stable on CBC on 11/25 Had referred him for colonoscopy ***  Migraine headaches Patient had a normal MRV on 12/1, contacted by Dr. Neita Garnet Current regimen: Sumatriptan as needed ***   PERTINENT  PMH / PSH: HTN, T2DM, hyperparathyroidism, HLD, morbid obesity, bipolar disorder  OBJECTIVE:   There were no vitals taken for this visit.   Physical Exam: *** General: 44 y.o. male in NAD Cardio: RRR no m/r/g Lungs: CTAB, no wheezing, no rhonchi, no crackles, no IWOB on *** Abdomen: Soft, non-tender to palpation, non-distended, positive bowel sounds Skin: warm and dry Extremities: No edema   ASSESSMENT/PLAN:   No problem-specific Assessment & Plan notes found for this encounter.     Unknown Jim, DO [facility]

## 2020-04-24 ENCOUNTER — Other Ambulatory Visit: Payer: Self-pay | Admitting: Family Medicine

## 2020-04-26 NOTE — Progress Notes (Signed)
    SUBJECTIVE:   CHIEF COMPLAINT / HPI:   Hemiplegic migraines Seen multiple times for left-sided throbbing headache with left arm numbness Has been using sumatriptan 2-3 times/week, also Excedrin a few times a week Taking magnesium as well (unsure of dose), feels it has been helping Having some associated nausea, has not been taking any medications for this - previously had Zofran and Reglan prescribed CTA head/neck in the ED 11/12 unremarkable Recent MRV 12/1 normal  T2DM Current regimen: empagliflozin 25mg , metformin 1000mg  BID, Victoza 1.8mg  QD, Lantus 45u His Lantus was increased from 40 to 45 units at last visit 2 weeks ago Forgot to increase, has been taking 40 units of the Lantus Fasting sugars 110-150s Symptomatic hypoglycemia 2-3 times/week, feels shaky Didn't take Lantus yesterday, felt his sugar was too low Last eye exam 2 weeks ago, normal - need records ([facility]) Has an appointment with diabetic nutritionist next month   PERTINENT  PMH / PSH: T2DM, HTN, morbid obesity, hemiplegic migraines, bipolar disorder  OBJECTIVE:   BP 100/62   Pulse (!) 121   Wt 262 lb 3.2 oz (118.9 kg)   SpO2 98%   BMI 41.07 kg/m   General: Obese male, NAD CV: Tachycardic, regular rhythm, no murmurs Pulm: CTAB, no wheezes or rales  ASSESSMENT/PLAN:   Essential hypertension Well-controlled, no changes to medication regimen.  Type 2 diabetes mellitus with complication (HCC) Not well-controlled, most recent A1c 11.2. However, appears to be having symptomatic hypoglycemia. Instructed patient to log his blood sugars, check when he feels hypoglycemic, and bring log at follow-up visit in 2 weeks. - continue Lantus 40 units for now - continue metformin, SGLT2, GLP-1 agonist  Migraine Adequately controlled with Excedrin and sumatriptan, using it sparingly. Also taking OTC magnesium. - refilled sumatriptan - reordered metoclopramide for nausea - ophtho exam UTD, will need  records  HCM - PNA vaccine declined, patient will consider in future   F/u 2 weeks  , MD [facility]

## 2020-04-27 ENCOUNTER — Other Ambulatory Visit: Payer: Self-pay

## 2020-04-27 ENCOUNTER — Ambulatory Visit (INDEPENDENT_AMBULATORY_CARE_PROVIDER_SITE_OTHER): Payer: Medicaid Other | Admitting: Family Medicine

## 2020-04-27 ENCOUNTER — Encounter: Payer: Self-pay | Admitting: Family Medicine

## 2020-04-27 VITALS — BP 100/62 | HR 121 | Wt 262.2 lb

## 2020-04-27 DIAGNOSIS — E118 Type 2 diabetes mellitus with unspecified complications: Secondary | ICD-10-CM

## 2020-04-27 DIAGNOSIS — G43409 Hemiplegic migraine, not intractable, without status migrainosus: Secondary | ICD-10-CM

## 2020-04-27 DIAGNOSIS — G43109 Migraine with aura, not intractable, without status migrainosus: Secondary | ICD-10-CM

## 2020-04-27 DIAGNOSIS — I10 Essential (primary) hypertension: Secondary | ICD-10-CM | POA: Diagnosis not present

## 2020-04-27 MED ORDER — METOCLOPRAMIDE HCL 5 MG PO TABS: 5.0000 mg | ORAL_TABLET | Freq: Four times a day (QID) | ORAL | 0 refills | Status: DC | PRN

## 2020-04-27 MED ORDER — SUMATRIPTAN SUCCINATE 25 MG PO TABS: 25.0000 mg | ORAL_TABLET | ORAL | 0 refills | Status: DC | PRN

## 2020-04-27 NOTE — Assessment & Plan Note (Signed)
Well-controlled, no changes to medication regimen.

## 2020-04-27 NOTE — Assessment & Plan Note (Signed)
Not well-controlled, most recent A1c 11.2. However, appears to be having symptomatic hypoglycemia. Instructed patient to log his blood sugars, check when he feels hypoglycemic, and bring log at follow-up visit in 2 weeks. - continue Lantus 40 units for now - continue metformin, SGLT2, GLP-1 agonist

## 2020-04-27 NOTE — Patient Instructions (Signed)
It was nice seeing you today, Marcus [name].  I am not going to make any changes to your diabetes medications today. Stay on the Lantus 40 units for now.  Please keep a log of your blood sugars and bring them to your next visit. Be sure to check your blood sugar when you feel your blood sugar is low.  Stay well, [name], MD

## 2020-04-27 NOTE — Assessment & Plan Note (Addendum)
Adequately controlled with Excedrin and sumatriptan, using it sparingly. Also taking OTC magnesium. - refilled sumatriptan - reordered metoclopramide for nausea - ophtho exam UTD, will need records

## 2020-05-06 ENCOUNTER — Ambulatory Visit: Payer: Medicaid Other | Admitting: Dietician

## 2020-05-11 ENCOUNTER — Ambulatory Visit: Payer: Medicaid Other | Admitting: Family Medicine

## 2020-05-12 ENCOUNTER — Ambulatory Visit (HOSPITAL_COMMUNITY)
Admission: RE | Admit: 2020-05-12 | Discharge: 2020-05-12 | Disposition: A | Discharge status: Home / Self Care | Payer: Medicaid Other | Source: Ambulatory Visit | Attending: Family Medicine | Admitting: Family Medicine

## 2020-05-12 ENCOUNTER — Encounter: Payer: Self-pay | Admitting: Family Medicine

## 2020-05-12 ENCOUNTER — Other Ambulatory Visit: Payer: Self-pay

## 2020-05-12 ENCOUNTER — Ambulatory Visit (INDEPENDENT_AMBULATORY_CARE_PROVIDER_SITE_OTHER): Payer: Medicaid Other | Admitting: Family Medicine

## 2020-05-12 VITALS — BP 114/68 | HR 106 | Ht 67.0 in | Wt 266.2 lb

## 2020-05-12 DIAGNOSIS — I10 Essential (primary) hypertension: Secondary | ICD-10-CM | POA: Diagnosis not present

## 2020-05-12 DIAGNOSIS — R000 Tachycardia, unspecified: Secondary | ICD-10-CM

## 2020-05-12 DIAGNOSIS — E118 Type 2 diabetes mellitus with unspecified complications: Secondary | ICD-10-CM

## 2020-05-12 MED ORDER — LANTUS SOLOSTAR 100 UNIT/ML ~~LOC~~ SOPN
40.0000 [IU] | PEN_INJECTOR | Freq: Every day | SUBCUTANEOUS | 0 refills | Status: DC
Start: 2020-05-12 — End: 2020-06-21

## 2020-05-12 NOTE — Assessment & Plan Note (Signed)
Well-controlled, no changes to current regimen.

## 2020-05-12 NOTE — Progress Notes (Signed)
    SUBJECTIVE:   CHIEF COMPLAINT / HPI:   T2DM Current regimen: empagliflozin 25mg ,metformin1000mg  BID, Victoza 1.8mg  QD, Lantus 40u At last visit, patient was maintained on 40 units Lantus (was increased to 45 units at prior visit but patient forgot) due to symptomatic hypoglycemia 2-3 times/week Patient instructed to keep log of blood sugars Today, patient states he has been taking 45 units of Lantus rather than 40 units. He has had 2-3 episodes of symptomatic hypoglycemia over the past 2 weeks, on days he has skipped meals.  He also skipped his insulin 2 days over the past 2 weeks because he was feeling tired. He brings in the office his blood sugar logs over the past 2 weeks. - Fasting sugars high 200s (250-300) - Highest sugar over 600 (skipped insulin this day) - Two readings in the 120s (symptomatic hypoglycemia, improved after eating something)  HTN On amlodipine 10 mg, chlorthalidone 25 mg, lisinopril 40 mg At home, readings 120s/90-100   PERTINENT  PMH / PSH: T2DM, HTN, morbid obesity, hemiplegic migraines, bipolar disorder  OBJECTIVE:   BP 114/68   Pulse (!) 106   Ht 5\' 7"  (1.702 m)   Wt 266 lb 3.2 oz (120.7 kg)   SpO2 96%   BMI 41.69 kg/m   General: Obese middle-aged male, NAD CV: Tachycardic, regular rhythm, no murmurs Pulm: CTAB, no wheezes or rales  ASSESSMENT/PLAN:   Sinus tachycardia Persistently tachycardic at several office visits, most recent EKG 1 year ago showing sinus tachycardia without any other abnormalities. - repeat EKG today - sinus tachycardia otherwise normal  Essential hypertension Well-controlled, no changes to current regimen.  Type 2 diabetes mellitus with complication (HCC) Poorly controlled, most recent A1c 11.2. Still having occasional symptomatic hypoglycemia which seems to be related to skipping meals.  We will have him go back to 40 units of Lantus given symptomatic hypoglycemic episodes.  Encourage patient to eat meals  regularly and take his insulin every day.  He is to continue logging his sugars. - Lantus 40 units - continue Metformin, empagliflozin, liraglutide   F/u 2 weeks  , MD [facility]

## 2020-05-12 NOTE — Patient Instructions (Addendum)
It was nice seeing you today!  Please go back to taking the 40 units of insulin. Please remember to take your insulin every day and eat regular meals every day. Try not to skip any meals. Please continue to log your blood sugars.  Please return in 2 weeks for a follow-up visit.  Stay well, [name], MD [facility] [phone]    Diabetes Mellitus and Nutrition, Adult When you have diabetes (diabetes mellitus), it is very important to have healthy eating habits because your blood sugar (glucose) levels are greatly affected by what you eat and drink. Eating healthy foods in the appropriate amounts, at about the same times every day, can help you:  Control your blood glucose.  Lower your risk of heart disease.  Improve your blood pressure.  Reach or maintain a healthy weight. Every person with diabetes is different, and each person has different needs for a meal plan. Your health care provider may recommend that you work with a diet and nutrition specialist (dietitian) to make a meal plan that is best for you. Your meal plan may vary depending on factors such as:  The calories you need.  The medicines you take.  Your weight.  Your blood glucose, blood pressure, and cholesterol levels.  Your activity level.  Other health conditions you have, such as heart or kidney disease. How do carbohydrates affect me? Carbohydrates, also called carbs, affect your blood glucose level more than any other type of food. Eating carbs naturally raises the amount of glucose in your blood. Carb counting is a method for keeping track of how many carbs you eat. Counting carbs is important to keep your blood glucose at a healthy level, especially if you use insulin or take certain oral diabetes medicines. It is important to know how many carbs you can safely have in each meal. This is different for every person. Your dietitian can help you calculate how many carbs you should  have at each meal and for each snack. Foods that contain carbs include:  Bread, cereal, rice, pasta, and crackers.  Potatoes and corn.  Peas, beans, and lentils.  Milk and yogurt.  Fruit and juice.  Desserts, such as cakes, cookies, ice cream, and candy. How does alcohol affect me? Alcohol can cause a sudden decrease in blood glucose (hypoglycemia), especially if you use insulin or take certain oral diabetes medicines. Hypoglycemia can be a life-threatening condition. Symptoms of hypoglycemia (sleepiness, dizziness, and confusion) are similar to symptoms of having too much alcohol. If your health care provider says that alcohol is safe for you, follow these guidelines:  Limit alcohol intake to no more than 1 drink per day for nonpregnant women and 2 drinks per day for men. One drink equals 12 oz of beer, 5 oz of wine, or 1 oz of hard liquor.  Do not drink on an empty stomach.  Keep yourself hydrated with water, diet soda, or unsweetened iced tea.  Keep in mind that regular soda, juice, and other mixers may contain a lot of sugar and must be counted as carbs. What are tips for following this plan?  Reading food labels  Start by checking the serving size on the "Nutrition Facts" label of packaged foods and drinks. The amount of calories, carbs, fats, and other nutrients listed on the label is based on one serving of the item. Many items contain more than one serving per package.  Check the total grams (g) of carbs in one serving.  You can calculate the number of servings of carbs in one serving by dividing the total carbs by 15. For example, if a food has 30 g of total carbs, it would be equal to 2 servings of carbs.  Check the number of grams (g) of saturated and trans fats in one serving. Choose foods that have low or no amount of these fats.  Check the number of milligrams (mg) of salt (sodium) in one serving. Most people should limit total sodium intake to less than 2,300 mg per  day.  Always check the nutrition information of foods labeled as "low-fat" or "nonfat". These foods may be higher in added sugar or refined carbs and should be avoided.  Talk to your dietitian to identify your daily goals for nutrients listed on the label. Shopping  Avoid buying canned, premade, or processed foods. These foods tend to be high in fat, sodium, and added sugar.  Shop around the outside edge of the grocery store. This includes fresh fruits and vegetables, bulk grains, fresh meats, and fresh dairy. Cooking  Use low-heat cooking methods, such as baking, instead of high-heat cooking methods like deep frying.  Cook using healthy oils, such as olive, canola, or sunflower oil.  Avoid cooking with butter, cream, or high-fat meats. Meal planning  Eat meals and snacks regularly, preferably at the same times every day. Avoid going long periods of time without eating.  Eat foods high in fiber, such as fresh fruits, vegetables, beans, and whole grains. Talk to your dietitian about how many servings of carbs you can eat at each meal.  Eat 4-6 ounces (oz) of lean protein each day, such as lean meat, chicken, fish, eggs, or tofu. One oz of lean protein is equal to: ? 1 oz of meat, chicken, or fish. ? 1 egg. ?  cup of tofu.  Eat some foods each day that contain healthy fats, such as avocado, nuts, seeds, and fish. Lifestyle  Check your blood glucose regularly.  Exercise regularly as told by your health care provider. This may include: ? 150 minutes of moderate-intensity or vigorous-intensity exercise each week. This could be brisk walking, biking, or water aerobics. ? Stretching and doing strength exercises, such as yoga or weightlifting, at least 2 times a week.  Take medicines as told by your health care provider.  Do not use any products that contain nicotine or tobacco, such as cigarettes and e-cigarettes. If you need help quitting, ask your health care provider.  Work with  a Social worker or diabetes educator to identify strategies to manage stress and any emotional and social challenges. Questions to ask a health care provider  Do I need to meet with a diabetes educator?  Do I need to meet with a dietitian?  What number can I call if I have questions?  When are the best times to check my blood glucose? Where to find more information:  American Diabetes Association: diabetes.org  Academy of Nutrition and Dietetics: www.eatright.[employer] of Diabetes and Digestive and Kidney Diseases (NIH): [employer] Summary  A healthy meal plan will help you control your blood glucose and maintain a healthy lifestyle.  Working with a diet and nutrition specialist (dietitian) can help you make a meal plan that is best for you.  Keep in mind that carbohydrates (carbs) and alcohol have immediate effects on your blood glucose levels. It is important to count carbs and to use alcohol carefully. This information is not intended to replace advice given to you  by your health care provider. Make sure you discuss any questions you have with your health care provider. Document Revised: 04/13/2017 Document Reviewed: 06/05/2016 Elsevier Patient Education  2020 [employer].

## 2020-05-12 NOTE — Assessment & Plan Note (Signed)
Poorly controlled, most recent A1c 11.2. Still having occasional symptomatic hypoglycemia which seems to be related to skipping meals.  We will have him go back to 40 units of Lantus given symptomatic hypoglycemic episodes.  Encourage patient to eat meals regularly and take his insulin every day.  He is to continue logging his sugars. - Lantus 40 units - continue Metformin, empagliflozin, liraglutide

## 2020-05-12 NOTE — Assessment & Plan Note (Addendum)
Persistently tachycardic at several office visits, most recent EKG 1 year ago showing sinus tachycardia without any other abnormalities. - repeat EKG today - sinus tachycardia otherwise normal

## 2020-05-26 ENCOUNTER — Ambulatory Visit (INDEPENDENT_AMBULATORY_CARE_PROVIDER_SITE_OTHER): Payer: Medicaid Other | Admitting: Podiatry

## 2020-05-26 ENCOUNTER — Encounter: Payer: Self-pay | Admitting: Podiatry

## 2020-05-26 ENCOUNTER — Other Ambulatory Visit: Payer: Self-pay

## 2020-05-26 DIAGNOSIS — M79676 Pain in unspecified toe(s): Secondary | ICD-10-CM

## 2020-05-26 DIAGNOSIS — E1140 Type 2 diabetes mellitus with diabetic neuropathy, unspecified: Secondary | ICD-10-CM

## 2020-05-26 DIAGNOSIS — N179 Acute kidney failure, unspecified: Secondary | ICD-10-CM

## 2020-05-26 DIAGNOSIS — B351 Tinea unguium: Secondary | ICD-10-CM | POA: Diagnosis not present

## 2020-05-26 DIAGNOSIS — E1149 Type 2 diabetes mellitus with other diabetic neurological complication: Secondary | ICD-10-CM

## 2020-05-26 NOTE — Progress Notes (Signed)
Complaint:  Visit Type: Patient returns to my office for continued preventative foot care services. Complaint: Patient states" my nails have grown long and thick and become painful to walk and wear shoes" Patient has been diagnosed with DM with no foot complications. The patient presents for preventative foot care services. No changes to ROS  Podiatric Exam: Vascular: dorsalis pedis and posterior tibial pulses are palpable bilateral. Capillary return is immediate. Temperature gradient is WNL. Skin turgor WNL  Sensorium: Normal Semmes Weinstein monofilament test. Normal tactile sensation bilaterally. Nail Exam: Pt has thick disfigured discolored nails with subungual debris noted bilateral entire nail hallux through fifth toenails Ulcer Exam: There is no evidence of ulcer or pre-ulcerative changes or infection. Orthopedic Exam: Muscle tone and strength are WNL. No limitations in general ROM. No crepitus or effusions noted. Foot type and digits show no abnormalities. Bony prominences are unremarkable. Skin: No Porokeratosis. No infection or ulcers  Diagnosis:  Onychomycosis, , Pain in right toe, pain in left toes  Treatment & Plan Procedures and Treatment: Consent by patient was obtained for treatment procedures.   Debridement of mycotic and hypertrophic toenails, 1 through 5 bilateral and clearing of subungual debris using a nail nipper and then dremel tool. . No ulceration, no infection noted.  Return Visit-Office Procedure: Patient instructed to return to the office for a follow up visit 4 months for continued evaluation and treatment.    Solange Emry DPM 

## 2020-05-28 ENCOUNTER — Other Ambulatory Visit: Payer: Self-pay

## 2020-05-28 ENCOUNTER — Encounter: Payer: Medicaid Other | Attending: Endocrinology | Admitting: Dietician

## 2020-05-28 ENCOUNTER — Encounter: Payer: Self-pay | Admitting: Dietician

## 2020-05-28 DIAGNOSIS — E118 Type 2 diabetes mellitus with unspecified complications: Secondary | ICD-10-CM | POA: Insufficient documentation

## 2020-05-28 NOTE — Patient Instructions (Addendum)
Recommend 5,000 units of vitamin D daily. Rethink what you drink.  Drink beverages without carbohydrate.  Water is a great choice. Reduce bread intake. Increase your vegetable intake. Reduce your fat intake.  Reduce mayo. Start walking or some other exercise that you enjoy. Cook more meals at home

## 2020-05-28 NOTE — Progress Notes (Signed)
Diabetes Self-Management Education  Visit Type: First/Initial  Appt. Start Time: 0943 Appt. End Time: 0951  05/28/2020  [name], identified by name and date of birth, is a 45 y.o. adult with a diagnosis of Diabetes: Type 2.   ASSESSMENT Patient is here today alone.  He would like to learn more about diabetes and nutrition education. He states that the hardest thing about diabetes is avoiding certain foods, staying in a routine with meals.  History includes Type 2 Diabetes (2005), HTN, HLD, vitamin D deficiency  Weight 262 lbs UBW for several years.  Patient lives with his wife and 2 children (6 children total).  He is not employed.  They eat out most often.  His wife works from 10-7.  No food stamps.  Sometimes food runs out before he has money to get more.   Was walking until it got cold.  Is thinking about starting back at the gym again.  Height 5\' 7"  (1.702 m), weight 262 lb (118.8 kg). Body mass index is 41.04 kg/m.   Diabetes Self-Management Education - 05/28/20 1001      Visit Information   Visit Type First/Initial      Initial Visit   Diabetes Type Type 2    Are you currently following a meal plan? No    Are you taking your medications as prescribed? Yes    Date Diagnosed 2005      Health Coping   How would you rate your overall health? Poor      Psychosocial Assessment   Patient Belief/Attitude about Diabetes Motivated to manage diabetes    Self-care barriers None    Self-management support Doctor's office    Other persons present Patient    Patient Concerns Nutrition/Meal planning;Glycemic Control    Special Needs Simplified materials    Preferred Learning Style No preference indicated    Learning Readiness Ready    How often do you need to have someone help you when you read instructions, pamphlets, or other written materials from your doctor or pharmacy? 4 - Often    What is the last grade level you completed in school? 11      Pre-Education  Assessment   Patient understands the diabetes disease and treatment process. Needs Instruction    Patient understands incorporating nutritional management into lifestyle. Needs Instruction    Patient undertands incorporating physical activity into lifestyle. Needs Instruction    Patient understands using medications safely. Needs Instruction    Patient understands monitoring blood glucose, interpreting and using results Needs Instruction    Patient understands prevention, detection, and treatment of acute complications. Needs Instruction    Patient understands prevention, detection, and treatment of chronic complications. Needs Instruction    Patient understands how to develop strategies to address psychosocial issues. Needs Instruction    Patient understands how to develop strategies to promote health/change behavior. Needs Instruction      Complications   Last HgB A1C per patient/outside source 11.2 %   03/29/2020   How often do you check your blood sugar? 1-2 times/day    Fasting Blood glucose range (mg/dL) 03/31/2020    Postprandial Blood glucose range (mg/dL) >700;174-944    Number of hypoglycemic episodes per month 0    Have you had a dilated eye exam in the past 12 months? No    Have you had a dental exam in the past 12 months? No    Are you checking your feet? No      Dietary Intake  Breakfast egg sandwich on biscuit or white or wheat bread (sometimes out)   10-11   Snack (morning) none    Lunch none    Snack (afternoon) chips or microwave popcorn    Dinner club sandwich OR [employer] or [employer] OR boiled chicken, bread, occasional mashed potatoes, green beans    Snack (evening) popcorn    Beverage(s) water, regular soda (coke or Mt. Dew), occasional sweet tea      Exercise   Exercise Type ADL's    How many days per week to you exercise? 0    How many minutes per day do you exercise? 0    Total minutes per week of exercise 0      Patient Education   Previous  Diabetes Education No    Disease state  Definition of diabetes, type 1 and 2, and the diagnosis of diabetes;Explored patient's options for treatment of their diabetes    Nutrition management  Role of diet in the treatment of diabetes and the relationship between the three main macronutrients and blood glucose level;Meal options for control of blood glucose level and chronic complications.;Information on hints to eating out and maintain blood glucose control.    Physical activity and exercise  Role of exercise on diabetes management, blood pressure control and cardiac health.    Medications Reviewed patients medication for diabetes, action, purpose, timing of dose and side effects.    Monitoring Taught/discussed recording of test results and interpretation of SMBG.;Identified appropriate SMBG and/or A1C goals.;Daily foot exams    Acute complications Taught treatment of hypoglycemia - the 15 rule.;Discussed and identified patients' treatment of hyperglycemia.    Chronic complications Assessed and discussed foot care and prevention of foot problems;Relationship between chronic complications and blood glucose control    Psychosocial adjustment Worked with patient to identify barriers to care and solutions;Role of stress on diabetes;Identified and addressed patients feelings and concerns about diabetes      Individualized Goals (developed by patient)   Nutrition General guidelines for healthy choices and portions discussed    Physical Activity Exercise 5-7 days per week;30 minutes per day    Medications take my medication as prescribed    Monitoring  test my blood glucose as discussed    Reducing Risk treat hypoglycemia with 15 grams of carbs if blood glucose less than 70mg /dL;examine blood glucose patterns;do foot checks daily;increase portions of healthy fats      Post-Education Assessment   Patient understands the diabetes disease and treatment process. Demonstrates understanding / competency     Patient understands incorporating nutritional management into lifestyle. Needs Review    Patient undertands incorporating physical activity into lifestyle. Demonstrates understanding / competency    Patient understands using medications safely. Demonstrates understanding / competency    Patient understands monitoring blood glucose, interpreting and using results Demonstrates understanding / competency    Patient understands prevention, detection, and treatment of acute complications. Demonstrates understanding / competency    Patient understands prevention, detection, and treatment of chronic complications. Demonstrates understanding / competency    Patient understands how to develop strategies to address psychosocial issues. Demonstrates understanding / competency    Patient understands how to develop strategies to promote health/change behavior. Needs Review      Outcomes   Expected Outcomes Demonstrated interest in learning. Expect positive outcomes    Future DMSE 4-6 wks    Program Status Not Completed           Individualized Plan for Diabetes Self-Management Training:  Learning Objective:  Patient will have a greater understanding of diabetes self-management. Patient education plan is to attend individual and/or group sessions per assessed needs and concerns.   Plan:   Patient Instructions  Recommend 5,000 units of vitamin D daily. Rethink what you drink.  Drink beverages without carbohydrate.  Water is a great choice. Reduce bread intake. Increase your vegetable intake. Reduce your fat intake.  Reduce mayo. Start walking or some other exercise that you enjoy. Cook more meals at home   Expected Outcomes:  Demonstrated interest in learning. Expect positive outcomes  Education material provided: ADA - How to Thrive: A Guide for Your Journey with Diabetes and Meal plan card  If problems or questions, patient to contact team via:  Phone  Future DSME appointment: 4-6 wks

## 2020-06-15 ENCOUNTER — Ambulatory Visit: Payer: Medicaid Other | Admitting: Family Medicine

## 2020-06-20 ENCOUNTER — Other Ambulatory Visit: Payer: Self-pay | Admitting: Family Medicine

## 2020-06-20 DIAGNOSIS — Z794 Long term (current) use of insulin: Secondary | ICD-10-CM

## 2020-06-20 DIAGNOSIS — E119 Type 2 diabetes mellitus without complications: Secondary | ICD-10-CM

## 2020-06-21 ENCOUNTER — Other Ambulatory Visit: Payer: Self-pay | Admitting: Family Medicine

## 2020-06-21 DIAGNOSIS — E118 Type 2 diabetes mellitus with unspecified complications: Secondary | ICD-10-CM

## 2020-06-24 NOTE — Progress Notes (Deleted)
    SUBJECTIVE:   CHIEF COMPLAINT / HPI:   T2DM Current regimen:empagliflozin25mg ,metformin1000mg  BID, Victoza 1.8mg  QD, Lantus 40u Poorly controlled at last visit 05/12/20. Was taking 45 units Lantus but had 2-3 episodes of symptomatic hypoglycemia related to skipping meals, also occasionally skipping insulin when feeling tired. Patient was encouraged to eat meals regularly and Lantus was decreased to 40 units. Had initial visit with RD 1/14, ***   PERTINENT  PMH / PSH: T2DM, HTN, HLD, obesity, bipolar disorder  OBJECTIVE:   There were no vitals taken for this visit.  General: ***, NAD CV: RRR, no murmurs*** Pulm: CTAB, no wheezes or rales  ASSESSMENT/PLAN:   No problem-specific Assessment & Plan notes found for this encounter.   HCM - PPSV *** - colonoscopy ***  [name], MD [facility]

## 2020-06-24 NOTE — Patient Instructions (Incomplete)
It was nice seeing you today!    Stay well, Erving Sassano, MD [facility] [phone]  

## 2020-06-25 ENCOUNTER — Ambulatory Visit: Payer: Medicaid Other | Admitting: Family Medicine

## 2020-07-09 ENCOUNTER — Ambulatory Visit: Payer: Medicaid Other | Admitting: Dietician

## 2020-07-20 ENCOUNTER — Other Ambulatory Visit: Payer: Self-pay | Admitting: Family Medicine

## 2020-07-20 DIAGNOSIS — E118 Type 2 diabetes mellitus with unspecified complications: Secondary | ICD-10-CM

## 2020-07-27 NOTE — Progress Notes (Signed)
    SUBJECTIVE:   CHIEF COMPLAINT / HPI:   T2DM Current regimen:empagliflozin25mg ,metformin1000mg  BID, Victoza 1.8mg  QD, Lantus 40u Most recent HbA1c 11.2 four months ago in November. At last visit, patient was still having occasional symptomatic hypoglycemia, so his Lantus dose was reduced back to 40 units.  He was instructed to continue logging his sugars. Has been seeing dietician, initial visit in January. Eats smaller amounts now and drinking fewer sodas, now drinking 1 can/day. Drinking more water too. Also been eating less bread. Feels his sugars have been high because he has been more fatigued. Ran out of his metformin for a few days. Glucometer broke a while ago so has not been able to measure his sugars. He has been taking the Lantus 40 units daily in the morning. Reports symptoms of hypoglycemia twice a week - woozy, shaky hands, sweaty. Will feel better after eating something. Can happen during the day or at night. Still skipping meals sometimes, often skips lunch but will usually eat breakfast.   PERTINENT  PMH / PSH: T2DM, HTN, morbid obesity, hemiplegic migraines, bipolar disorder  OBJECTIVE:   BP 104/62   Ht 5\' 7"  (1.702 m)   Wt 260 lb 6.4 oz (118.1 kg)   BMI 40.78 kg/m   General: Obese male, NAD CV: RRR, no murmurs, pulse manually rechecked: 88 Pulm: CTAB, no wheezes or rales  Wt Readings from Last 3 Encounters:  07/28/20 260 lb 6.4 oz (118.1 kg)  05/28/20 262 lb (118.8 kg)  05/12/20 266 lb 3.2 oz (120.7 kg)     ASSESSMENT/PLAN:   Type 2 diabetes mellitus with complication (HCC) Poorly controlled, A1c today 11.6. Continues to have symptomatic hypoglycemia which seems to be related to skipping meals, making it difficult to control diabetes.  Already maxed out on current oral therapies. Glucometer broken, will reorder. Would likely benefit from pharmacy clinic to assist with insulin titration, discussed with patient who is amenable. - glucometer  reordered - keep Lantus at 40 units - continue metformin, empagliflozin, Victoza - patient to schedule with pharmacy clinic   HCM - Pfizer covid booster given today - colonoscopy done at [facility] about 2 months ago per patient, polyp removed - told to follow-up in 3 years - records request sent for [facility]  Natrona heights, MD [facility]

## 2020-07-28 ENCOUNTER — Ambulatory Visit (INDEPENDENT_AMBULATORY_CARE_PROVIDER_SITE_OTHER): Payer: Medicaid Other | Admitting: Family Medicine

## 2020-07-28 ENCOUNTER — Encounter: Payer: Self-pay | Admitting: Family Medicine

## 2020-07-28 ENCOUNTER — Ambulatory Visit: Payer: Medicaid Other

## 2020-07-28 ENCOUNTER — Ambulatory Visit (INDEPENDENT_AMBULATORY_CARE_PROVIDER_SITE_OTHER): Payer: Medicaid Other

## 2020-07-28 ENCOUNTER — Other Ambulatory Visit: Payer: Self-pay

## 2020-07-28 VITALS — BP 104/62 | Ht 67.0 in | Wt 260.4 lb

## 2020-07-28 DIAGNOSIS — I10 Essential (primary) hypertension: Secondary | ICD-10-CM

## 2020-07-28 DIAGNOSIS — E118 Type 2 diabetes mellitus with unspecified complications: Secondary | ICD-10-CM

## 2020-07-28 DIAGNOSIS — Z23 Encounter for immunization: Secondary | ICD-10-CM

## 2020-07-28 LAB — POCT GLYCOSYLATED HEMOGLOBIN (HGB A1C): HbA1c, POC (controlled diabetic range): 11.6 % — AB (ref 0.0–7.0)

## 2020-07-28 MED ORDER — ACCU-CHEK GUIDE W/DEVICE KIT: PACK | 0 refills | Status: DC

## 2020-07-28 NOTE — Assessment & Plan Note (Addendum)
Poorly controlled, A1c today 11.6. Continues to have symptomatic hypoglycemia which seems to be related to skipping meals, making it difficult to control diabetes.  Already maxed out on current oral therapies. Glucometer broken, will reorder. Would likely benefit from pharmacy clinic to assist with insulin titration, discussed with patient who is amenable. - glucometer reordered - keep Lantus at 40 units - continue metformin, empagliflozin, Victoza - patient to schedule with pharmacy clinic

## 2020-07-28 NOTE — Patient Instructions (Addendum)
It was nice seeing you today!  Keep up the good work making changes in your diet.  Stay on the 40 units of Lantus for now. I have reordered your glucose meter.   Follow-up with me in 1 month. Also make an appointment with the pharmacy clinic as soon as possible.  Please arrive at least 15 minutes prior to your scheduled appointments.  Stay well, Marcus Deeds, MD [facility] [phone]

## 2020-08-09 ENCOUNTER — Other Ambulatory Visit: Payer: Self-pay | Admitting: Family Medicine

## 2020-08-09 ENCOUNTER — Ambulatory Visit: Payer: Medicaid Other | Admitting: Dietician

## 2020-08-09 DIAGNOSIS — E118 Type 2 diabetes mellitus with unspecified complications: Secondary | ICD-10-CM

## 2020-08-15 ENCOUNTER — Other Ambulatory Visit: Payer: Self-pay | Admitting: Family Medicine

## 2020-08-16 ENCOUNTER — Ambulatory Visit: Payer: Medicaid Other | Admitting: Pharmacist

## 2020-09-24 ENCOUNTER — Ambulatory Visit: Payer: Medicaid Other | Admitting: Podiatry

## 2020-10-06 ENCOUNTER — Ambulatory Visit: Payer: Medicaid Other | Admitting: Podiatry

## 2020-10-06 ENCOUNTER — Other Ambulatory Visit: Payer: Self-pay | Admitting: Family Medicine

## 2020-10-06 NOTE — Progress Notes (Deleted)
    SUBJECTIVE:   CHIEF COMPLAINT / HPI:   T2DM Current regimen:empagliflozin25mg ,metformin1000mg  BID, Victoza 1.8mg  QD, Lantus 40u At last visit was having symptomatic hypoglycemia twice a week related to skipping meals, no medication changes made. Fasting CBGs *** Reports symptomatic hypoglycemia ***  PERTINENT  PMH / PSH: ***  OBJECTIVE:   There were no vitals taken for this visit.  General: ***, NAD CV: RRR, no murmurs*** Pulm: CTAB, no wheezes or rales     ASSESSMENT/PLAN:   No problem-specific Assessment & Plan notes found for this encounter.     [name], MD [facility]   {    This will disappear when note is signed, click to select method of visit    :1}

## 2020-10-12 ENCOUNTER — Ambulatory Visit: Payer: Medicaid Other | Admitting: Pharmacist

## 2020-10-12 ENCOUNTER — Ambulatory Visit: Payer: Medicaid Other | Admitting: Family Medicine

## 2020-10-13 ENCOUNTER — Encounter: Payer: Self-pay | Admitting: Podiatry

## 2020-10-13 ENCOUNTER — Other Ambulatory Visit: Payer: Self-pay

## 2020-10-13 ENCOUNTER — Ambulatory Visit (INDEPENDENT_AMBULATORY_CARE_PROVIDER_SITE_OTHER): Payer: Medicaid Other | Admitting: Podiatry

## 2020-10-13 DIAGNOSIS — E1140 Type 2 diabetes mellitus with diabetic neuropathy, unspecified: Secondary | ICD-10-CM

## 2020-10-13 DIAGNOSIS — N179 Acute kidney failure, unspecified: Secondary | ICD-10-CM

## 2020-10-13 DIAGNOSIS — M79676 Pain in unspecified toe(s): Secondary | ICD-10-CM

## 2020-10-13 DIAGNOSIS — B351 Tinea unguium: Secondary | ICD-10-CM

## 2020-10-13 DIAGNOSIS — E1149 Type 2 diabetes mellitus with other diabetic neurological complication: Secondary | ICD-10-CM

## 2020-10-13 NOTE — Progress Notes (Signed)
Complaint:  Visit Type: Patient returns to my office for continued preventative foot care services. Complaint: Patient states" my nails have grown long and thick and become painful to walk and wear shoes" Patient has been diagnosed with DM with no foot complications. The patient presents for preventative foot care services. No changes to ROS  Podiatric Exam: Vascular: dorsalis pedis and posterior tibial pulses are palpable bilateral. Capillary return is immediate. Temperature gradient is WNL. Skin turgor WNL  Sensorium: Normal Semmes Weinstein monofilament test. Normal tactile sensation bilaterally. Nail Exam: Pt has thick disfigured discolored nails with subungual debris noted bilateral entire nail hallux through fifth toenails Ulcer Exam: There is no evidence of ulcer or pre-ulcerative changes or infection. Orthopedic Exam: Muscle tone and strength are WNL. No limitations in general ROM. No crepitus or effusions noted. Foot type and digits show no abnormalities. Bony prominences are unremarkable. Skin: No Porokeratosis. No infection or ulcers  Diagnosis:  Onychomycosis, , Pain in right toe, pain in left toes  Treatment & Plan Procedures and Treatment: Consent by patient was obtained for treatment procedures.   Debridement of mycotic and hypertrophic toenails, 1 through 5 bilateral and clearing of subungual debris using a nail nipper and then dremel tool. . No ulceration, no infection noted.  Return Visit-Office Procedure: Patient instructed to return to the office for a follow up visit 4 months for continued evaluation and treatment.    Draycen Leichter DPM 

## 2020-10-25 NOTE — Patient Instructions (Addendum)
It was nice seeing you today!  Continue taking the Lantus 40 units every day.  Check your blood sugar every morning before you eat.  If you feel symptoms of low blood sugar, make sure to check your blood sugar.  I will update you with results from your blood work.  Please arrive at least 15 minutes prior to your scheduled appointments.  Stay well, Marcus Deeds, MD [facility] [phone]

## 2020-10-25 NOTE — Progress Notes (Addendum)
    SUBJECTIVE:   CHIEF COMPLAINT / HPI:   T2DM Current regimen: empagliflozin 25mg , metformin 1000mg  BID, Victoza 1.8mg  QD, Lantus 40u At last visit was having symptomatic hypoglycemia twice a week related to skipping meals, no medication changes made. Fasting CBGs high 100s to low 200s, does not check every day Reports symptomatic hypoglycemia 1-2 times/week, improves after eating - does not check CBG during these episodes Reports occasionally missing his insulin dose about once a month. Reports good compliance with Victoza  Fatigue Reports feeling fatigued over the past 2-3 months, not every day Sometimes wakes up in the middle of the night Partner reports snoring and apneic episodes at night Feels daytime somnolence, will fall asleep during the day if he lays down Occasionally feels lightheaded Denies SOB Planning to start exercising at the gyn soon  HLD Reports compliance with rosuvastatin   PERTINENT  PMH / PSH: T2DM, HTN, morbid obesity, hemiplegic migraines, bipolar disorder (takes a monthly shot, sees Dr. at [place])  OBJECTIVE:   BP (!) 142/87   Pulse (!) 114   Ht 5\' 7"  (1.702 m)   Wt 253 lb (114.8 kg)   SpO2 99%   BMI 39.63 kg/m   General: Obese male, NAD CV: RRR, no murmurs Pulm: CTAB, no wheezes or rales  Lab Results  Component Value Date   HGBA1C 10.2 (A) 10/26/2020     ASSESSMENT/PLAN:   Type 2 diabetes mellitus with complication (HCC) Poorly controlled. Continue current regimen, no increase in insulin given hypoglycemic episodes. Instructed patient to log fasting CBG daily and when feeling symptomatic hypoglycemia. Patient has upcoming pharmacy clinic appointment next week.  Hyperlipidemia associated with type 2 diabetes mellitus (HCC) Lipid panel today   Fatigue Suspect symptoms are multifactorial but likely has underlying OSA based on symptoms. Will check labs to rule out other causes. Recommended exercise. - PSG - labs: CBC, BMP,  TSH  HCM - PCV20 declined, will think about it  [place], MD [facility]

## 2020-10-26 ENCOUNTER — Other Ambulatory Visit: Payer: Self-pay

## 2020-10-26 ENCOUNTER — Encounter: Payer: Self-pay | Admitting: Family Medicine

## 2020-10-26 ENCOUNTER — Ambulatory Visit (INDEPENDENT_AMBULATORY_CARE_PROVIDER_SITE_OTHER): Payer: Medicaid Other | Admitting: Family Medicine

## 2020-10-26 ENCOUNTER — Other Ambulatory Visit: Payer: Self-pay | Admitting: Family Medicine

## 2020-10-26 VITALS — BP 142/87 | HR 114 | Ht 67.0 in | Wt 253.0 lb

## 2020-10-26 DIAGNOSIS — E785 Hyperlipidemia, unspecified: Secondary | ICD-10-CM | POA: Diagnosis not present

## 2020-10-26 DIAGNOSIS — R0681 Apnea, not elsewhere classified: Secondary | ICD-10-CM | POA: Diagnosis not present

## 2020-10-26 DIAGNOSIS — E1169 Type 2 diabetes mellitus with other specified complication: Secondary | ICD-10-CM

## 2020-10-26 DIAGNOSIS — I10 Essential (primary) hypertension: Secondary | ICD-10-CM

## 2020-10-26 DIAGNOSIS — E118 Type 2 diabetes mellitus with unspecified complications: Secondary | ICD-10-CM | POA: Diagnosis present

## 2020-10-26 DIAGNOSIS — R5383 Other fatigue: Secondary | ICD-10-CM | POA: Diagnosis not present

## 2020-10-26 LAB — POCT GLYCOSYLATED HEMOGLOBIN (HGB A1C): HbA1c, POC (controlled diabetic range): 10.2 % — AB (ref 0.0–7.0)

## 2020-10-26 NOTE — Assessment & Plan Note (Signed)
Lipid panel today

## 2020-10-26 NOTE — Assessment & Plan Note (Signed)
Poorly controlled. Continue current regimen, no increase in insulin given hypoglycemic episodes. Instructed patient to log fasting CBG daily and when feeling symptomatic hypoglycemia. Patient has upcoming pharmacy clinic appointment next week.

## 2020-10-27 LAB — BASIC METABOLIC PANEL
BUN/Creatinine Ratio: 12 (ref 9–20)
BUN: 19 mg/dL (ref 6–24)
CO2: 19 mmol/L — ABNORMAL LOW (ref 20–29)
Calcium: 9.9 mg/dL (ref 8.7–10.2)
Chloride: 100 mmol/L (ref 96–106)
Creatinine, Ser: 1.53 mg/dL — ABNORMAL HIGH (ref 0.76–1.27)
Glucose: 251 mg/dL — ABNORMAL HIGH (ref 65–99)
Potassium: 5 mmol/L (ref 3.5–5.2)
Sodium: 138 mmol/L (ref 134–144)
eGFR: 56 mL/min/{1.73_m2} — ABNORMAL LOW (ref >59)

## 2020-10-27 LAB — LIPID PANEL
Chol/HDL Ratio: 9.3 ratio — ABNORMAL HIGH (ref 0.0–5.0)
Cholesterol, Total: 260 mg/dL — ABNORMAL HIGH (ref 100–199)
HDL: 28 mg/dL — ABNORMAL LOW (ref >39)
LDL Chol Calc (NIH): 153 mg/dL — ABNORMAL HIGH (ref 0–99)
Triglycerides: 418 mg/dL — ABNORMAL HIGH (ref 0–149)
VLDL Cholesterol Cal: 79 mg/dL — ABNORMAL HIGH (ref 5–40)

## 2020-10-27 LAB — CBC
Hematocrit: 36.5 % — ABNORMAL LOW (ref 37.5–51.0)
Hemoglobin: 12.5 g/dL — ABNORMAL LOW (ref 13.0–17.7)
MCH: 30 pg (ref 26.6–33.0)
MCHC: 34.2 g/dL (ref 31.5–35.7)
MCV: 88 fL (ref 79–97)
Platelets: 283 10*3/uL (ref 150–450)
RBC: 4.17 x10E6/uL (ref 4.14–5.80)
RDW: 12.2 % (ref 11.6–15.4)
WBC: 5.9 10*3/uL (ref 3.4–10.8)

## 2020-10-27 LAB — TSH RFX ON ABNORMAL TO FREE T4: TSH: 2.76 u[IU]/mL (ref 0.450–4.500)

## 2020-11-01 ENCOUNTER — Telehealth: Payer: Self-pay | Admitting: Family Medicine

## 2020-11-01 MED ORDER — EZETIMIBE 10 MG PO TABS: 10.0000 mg | ORAL_TABLET | Freq: Every day | ORAL | 3 refills | Status: DC

## 2020-11-01 NOTE — Telephone Encounter (Signed)
Called patient to discuss lab results.  He has mild anemia and slight elevation in his serum creatinine (which on chart review he appears to have a serum creatinine as high as 1.63 in the past).  Unclear at this time whether this is an acute rise in serum creatinine or fluctuation of underlying CKD.  Patient states that he has been taking a lot of Excedrin but no other NSAIDs.  Discussed that we will continue to monitor his kidney function for now and to avoid NSAIDs.  He is already on ACE inhibitor and SGLT2 inhibitor.  Discussed that mild anemia should not be causing the amount of fatigue that he has been having.  He is still awaiting a call to have the sleep study done.  Discussed that we can check further labs if he is still having excessive somnolence and fatigue at follow-up visit (which could include vitamin D levels, ESR).  Cholesterol and LDL remains elevated and slightly worse compared to 1 year prior despite being on max dose of rosuvastatin.  Will prescribe ezetimibe and recommended dietary changes.  Patient amenable to plan.

## 2020-11-02 ENCOUNTER — Ambulatory Visit (INDEPENDENT_AMBULATORY_CARE_PROVIDER_SITE_OTHER): Payer: Medicaid Other | Admitting: Pharmacist

## 2020-11-02 ENCOUNTER — Encounter: Payer: Self-pay | Admitting: Pharmacist

## 2020-11-02 ENCOUNTER — Other Ambulatory Visit: Payer: Self-pay

## 2020-11-02 DIAGNOSIS — E118 Type 2 diabetes mellitus with unspecified complications: Secondary | ICD-10-CM

## 2020-11-02 MED ORDER — INSULIN LISPRO (1 UNIT DIAL) 100 UNIT/ML (KWIKPEN): 10.0000 [IU] | PEN_INJECTOR | Freq: Every day | SUBCUTANEOUS | 11 refills | Status: DC

## 2020-11-02 NOTE — Patient Instructions (Signed)
Thank you for coming in today, it was a pleasure to see you.   START taking Humalog Kwik Pen (insulin lispro) 10 units injected into the skin 15 minutes before your evening meal.  Continue to regularly take your blood sugar in the morning and occasionally in the evening and around lunch.

## 2020-11-02 NOTE — Assessment & Plan Note (Signed)
Diabetes longstanding currently uncontrolled. Patient is able to verbalize appropriate hypoglycemia management plan. Medication adherence appears good. Control is suboptimal due to insulin resistance and lack of bolus insulin as he likely has post-prandial spikes in blood sugars.  -Started  rapid insulin Humalog (insulin lispro) at 10 units with the evening meal. Sample of Humalog KwikPen given,  -Continue GLP-1 Victoza (Liraglutide) .  Plan for patient to finish is home supply of Victoza, with the eventual switch to Trulicity (Dulaglutide) to decrease # of injections (patient currently has ~ 1.5 months supply of Victoza (liraglutide) remaining.  -Extensively discussed pathophysiology of diabetes, recommended lifestyle interventions, dietary effects on blood sugar control -Counseled on s/sx of and management of hypoglycemia -Next A1C anticipated 01/2021.

## 2020-11-02 NOTE — Progress Notes (Signed)
S:     Chief Complaint  Patient presents with   Medication Management    Diabetes      Patient arrives ambulating well and plesent.  Presents for diabetes evaluation, education, and management Patient was last seen by Primary Care Provider on 10/26/20.   Insurance coverage/medication affordability: [place] Medicaid  Medication adherence reported to be good .   Current diabetes medications include: Jardiance (empagliflozin) 25 mg daily, Insulin Glargine 40 units daily, Victoza (Liraglutide) 1.8 mg daily Current hypertension medications include: Lisinopril 40 mg, Amlodipine 10 mg Current hyperlipidemia medications include: Crestor (rosuvastatin) 40 mg, Zetia (ezetimibe) 10 mg  Patient reports hypoglycemic events. Feel funny, shaky. Resolves with eating. Events occur 1-2x/week. Usually happens in the morning or the night. Happens more often when he skips breakfast.   Patient reported dietary habits: Eats 2-3 meals/day Breakfast: Skips some days, eggs, muffin, breakfast sandwich Lunch:Eats meal most days, chicken, hot dogs, cheeseburger Dinner: Chicken, sandwiches (largest meal) Snacks:Chips, fruit (bananas, apples), sandwich  Drinks: Soda (1-2/day, [place] or Orange/Grape), ice water   Patient-reported exercise habits: Joined [employer], has yet to go but plans to go this week. Committed to going 2-3x a week for 45 minutes to 1 hour.     Patient reports nocturia once per night (nighttime urination).  Patient denies neuropathy (nerve pain). Patient denies visual changes. Patient denies self foot exams (reports seeing Podiatrist, reports good sensation in feet).     O:  Physical Exam Constitutional:      Appearance: He is obese.  Cardiovascular:     Rate and Rhythm: Tachycardia present.  Pulmonary:     Effort: Pulmonary effort is normal.  Neurological:     Mental Status: He is alert.  Psychiatric:        Behavior: Behavior normal.    Review of Systems  All  other systems reviewed and are negative.  Lab Results  Component Value Date   HGBA1C 10.2 (A) 10/26/2020   Vitals:   11/02/20 1054  BP: 121/90  Pulse: (!) 118  SpO2: 97%    Lipid Panel     Component Value Date/Time   CHOL 260 (H) 10/26/2020 1028   TRIG 418 (H) 10/26/2020 1028   HDL 28 (L) 10/26/2020 1028   CHOLHDL 9.3 (H) 10/26/2020 1028   CHOLHDL 7.6 (H) 06/25/2015 1050   VLDL 37 (H) 06/25/2015 1050   LDLCALC 153 (H) 10/26/2020 1028    Home fasting blood sugars: 200s-100s, mostly in the 200s   Clinical Atherosclerotic Cardiovascular Disease (ASCVD): No  The 10-year ASCVD risk score Denman George DC Jr., et al., 2013) is: 15.2%   Values used to calculate the score:     Age: 19 years     Sex: Male     Is Non-Hispanic African American: Yes     Diabetic: Yes     Tobacco smoker: No     Systolic Blood Pressure: 121 mmHg     Is BP treated: Yes     HDL Cholesterol: 28 mg/dL     Total Cholesterol: 260 mg/dL   PHQ-9 Score: 0  A/P: Diabetes longstanding currently uncontrolled. Patient is able to verbalize appropriate hypoglycemia management plan. Medication adherence appears good. Control is suboptimal due to insulin resistance and lack of bolus insulin as he likely has post-prandial spikes in blood sugars.  -Started  rapid insulin Humalog (insulin lispro) at 10 units with the evening meal. Sample of Humalog KwikPen given,  -Continue GLP-1 Victoza (Liraglutide) .  Plan for  patient to finish is home supply of Victoza, with the eventual switch to Trulicity (Dulaglutide) to decrease # of injections (patient currently has ~ 1.5 months supply of Victoza (liraglutide) remaining.  -Extensively discussed pathophysiology of diabetes, recommended lifestyle interventions, dietary effects on blood sugar control -Counseled on s/sx of and management of hypoglycemia -Next A1C anticipated 01/2021.    Hypertension longstanding currently controlled.  Blood pressure goal = 130/80 mmHg. Medication  adherence good. Written patient instructions provided.  Total time in face to face counseling 35 minutes.   Follow up Pharmacist 11/23/20 Clinic Visit.   Patient seen with Tomie China PharmD Candidate, Kinnie Feil, PharmD - PGY-1 Resident.

## 2020-11-02 NOTE — Progress Notes (Signed)
Reviewed: I agree with Dr. Koval's documentation and management. 

## 2020-11-23 ENCOUNTER — Ambulatory Visit (INDEPENDENT_AMBULATORY_CARE_PROVIDER_SITE_OTHER): Payer: Medicaid Other | Admitting: Pharmacist

## 2020-11-23 ENCOUNTER — Other Ambulatory Visit: Payer: Self-pay

## 2020-11-23 ENCOUNTER — Encounter: Payer: Self-pay | Admitting: Pharmacist

## 2020-11-23 DIAGNOSIS — E1169 Type 2 diabetes mellitus with other specified complication: Secondary | ICD-10-CM

## 2020-11-23 DIAGNOSIS — E118 Type 2 diabetes mellitus with unspecified complications: Secondary | ICD-10-CM

## 2020-11-23 DIAGNOSIS — E785 Hyperlipidemia, unspecified: Secondary | ICD-10-CM

## 2020-11-23 MED ORDER — INSULIN LISPRO (1 UNIT DIAL) 100 UNIT/ML (KWIKPEN): 16.0000 [IU] | PEN_INJECTOR | Freq: Every day | SUBCUTANEOUS | 11 refills | Status: DC

## 2020-11-23 NOTE — Assessment & Plan Note (Signed)
ASCVD risk - primary prevention in patient with diabetes. Last LDL is not controlled. ASCVD risk score is >20%  - high intensity statin indicated.  -Continued  rosuvastatin 40 mg and ezetimibe 10mg  daily. Repeat lipid panel at next visit.

## 2020-11-23 NOTE — Patient Instructions (Addendum)
Great to see you again today.   Increase you Humalog from 10 units to 16 units prior to your evening (largest) meal each day.  New prescription sent to your pharmacy.   Follow-up in Pharmacy Clinic in 1 month - August 9th at 8:30

## 2020-11-23 NOTE — Assessment & Plan Note (Signed)
Diabetes longstanding with improved control currently following the addition of 1 dose per day of prandial insulin. Patient is able to verbalize appropriate hypoglycemia management plan. Medication adherence appears good with a single episode of non-adherence reported. Control is suboptimal due to inadequate regimen however, control is modestly improved with Basal +1 Humalog (insulin lispro) addition. -Continued basal insulin Lantus (insulin glardine) at 40 nits once daily in the AM.  -Increased dose of  rapid insulin Humalog  (insulin lispro) from 10 to 16 units.  -Continued GLP-1 Victoza (generic name liraglutide) at 1.8mg  daily.  Patient has ~ 1 month supply of this remaining.  Plan continues to be switch to dulaglutide to decrease injections per day (at next visit).   -Continued SGLT2-I  Jardiance (generic name empagliflozin) at 25mg  daily.  -Extensively discussed pathophysiology of diabetes, recommended lifestyle interventions, dietary effects on blood sugar control.  Increasing exercise and improved food quality (more vegetable servings per week) we key topics -Counseled on s/sx of and management of hypoglycemia -Next A1C anticipated 1-2 months.

## 2020-11-23 NOTE — Progress Notes (Signed)
S:     Chief Complaint  Patient presents with   Medication Management    Diabetes - Insulin Adjustment    Patient arrives in good spirits, ambulating without assistance.  Presents for diabetes evaluation, education, and management. Patient was referred and last seen by Primary Care Provider, Dr. Wynelle Link on 10/26/2020.   Family/Social History: Wife, enjoys walking with her in the local parks  Insurance coverage/medication affordability: Medicaid  Medication adherence reported good, missed insulin 1 day since last visit. .   Current diabetes medications include: Liraglutide 1.8mg  daily, lnsulin glargine 40 units once daily, and insulin lispro 10 units prior to largest meal of the day. Current hypertension medications include: lisinopril, chlorthalidone, and amlodipine Current hyperlipidemia medications include: rosuvastatin and ezetimibe  Patient denies hypoglycemic events.  Patient reported dietary habits: typically eats 3 meals/day Breakfast:eggs and toast Lunch:sandwich +/- chips Dinner:variety of proteins; chicken, white fish, steak, NOT pork with some vegetables and some carb Drinks:increased water intake, reduced soda to 1 in the previous 2 weeks and it was DIET [facility]  Patient-reported exercise habits: minimal but includes 45 minute walks with his wife   Patient denies nocturia (nighttime urination).    O:  Physical Exam Constitutional:      Appearance: Normal appearance. He is obese.  Cardiovascular:     Rate and Rhythm: Tachycardia present.  Pulmonary:     Effort: Pulmonary effort is normal.  Neurological:     Mental Status: He is alert.  Psychiatric:        Mood and Affect: Mood normal.        Thought Content: Thought content normal.        Judgment: Judgment normal.    Review of Systems  All other systems reviewed and are negative.  Lab Results  Component Value Date   HGBA1C 10.2 (A) 10/26/2020   Vitals:   11/23/20 0848  BP: (!) 142/82   Pulse: (!) 107  SpO2: 96%    Lipid Panel     Component Value Date/Time   CHOL 260 (H) 10/26/2020 1028   TRIG 418 (H) 10/26/2020 1028   HDL 28 (L) 10/26/2020 1028   CHOLHDL 9.3 (H) 10/26/2020 1028   CHOLHDL 7.6 (H) 06/25/2015 1050   VLDL 37 (H) 06/25/2015 1050   LDLCALC 153 (H) 10/26/2020 1028    Home fasting blood sugars: wide range of 82-566 since last visit.  Majority of AM fasting readings were 140-240   Clinical Atherosclerotic Cardiovascular Disease (ASCVD): No  The 10-year ASCVD risk score Denman George DC Jr., et al., 2013) is: 20.1%   Values used to calculate the score:     Age: 45 years     Sex: Male     Is Non-Hispanic African American: Yes     Diabetic: Yes     Tobacco smoker: No     Systolic Blood Pressure: 142 mmHg     Is BP treated: Yes     HDL Cholesterol: 28 mg/dL     Total Cholesterol: 260 mg/dL   A/P: Diabetes longstanding with improved control currently following the addition of 1 dose per day of prandial insulin. Patient is able to verbalize appropriate hypoglycemia management plan. Medication adherence appears good with a single episode of non-adherence reported. Control is suboptimal due to inadequate regimen however, control is modestly improved with Basal +1 Humalog (insulin lispro) addition. -Continued basal insulin Lantus (insulin glardine) at 40 nits once daily in the AM.  -Increased dose of  rapid insulin Humalog  (  insulin lispro) from 10 to 16 units.  -Continued GLP-1 Victoza (generic name liraglutide) at 1.8mg  daily.  Patient has ~ 1 month supply of this remaining.  Plan continues to be switch to dulaglutide to decrease injections per day (at next visit).   -Continued SGLT2-I  Jardiance (generic name empagliflozin) at 25mg  daily.  -Extensively discussed pathophysiology of diabetes, recommended lifestyle interventions, dietary effects on blood sugar control.  Increasing exercise and improved food quality (more vegetable servings per week) we key  topics -Counseled on s/sx of and management of hypoglycemia -Next A1C anticipated 1-2 months.   ASCVD risk - primary prevention in patient with diabetes. Last LDL is not controlled. ASCVD risk score is >20%  - high intensity statin indicated.  -Continued  rosuvastatin 40 mg and ezetimibe 10mg  daily. Repeat lipid panel at next visit.    Written patient instructions provided.  Total time in face to face counseling 22 minutes.   Follow up Pharmacist Clinic Visit in 1 month.

## 2020-11-24 NOTE — Progress Notes (Signed)
Reviewed: I agree with the documentation and management of Dr. Koval. 

## 2020-11-30 ENCOUNTER — Other Ambulatory Visit: Payer: Self-pay | Admitting: Family Medicine

## 2020-12-02 ENCOUNTER — Other Ambulatory Visit: Payer: Self-pay | Admitting: Family Medicine

## 2020-12-03 ENCOUNTER — Other Ambulatory Visit: Payer: Self-pay

## 2020-12-03 ENCOUNTER — Other Ambulatory Visit: Payer: Self-pay | Admitting: Family Medicine

## 2020-12-03 DIAGNOSIS — E118 Type 2 diabetes mellitus with unspecified complications: Secondary | ICD-10-CM

## 2020-12-03 MED ORDER — LISINOPRIL 40 MG PO TABS: ORAL_TABLET | ORAL | 1 refills | Status: DC

## 2020-12-03 MED ORDER — EMPAGLIFLOZIN 25 MG PO TABS: 25.0000 mg | ORAL_TABLET | Freq: Every day | ORAL | 3 refills | Status: DC

## 2020-12-03 MED ORDER — BD PEN NEEDLE SHORT U/F 31G X 8 MM MISC: 0 refills | Status: DC

## 2020-12-03 NOTE — Telephone Encounter (Signed)
Patient calls nurse line requesting medication refills. Marcus [name] was recently denied with message stating it was a duplicate. Patient reports that he still needs this refill.   Please advise if patient is no longer supposed to be taking medication.   Veronda Prude, RN

## 2020-12-16 ENCOUNTER — Other Ambulatory Visit: Payer: Self-pay | Admitting: Family Medicine

## 2020-12-16 DIAGNOSIS — E119 Type 2 diabetes mellitus without complications: Secondary | ICD-10-CM

## 2020-12-16 DIAGNOSIS — Z794 Long term (current) use of insulin: Secondary | ICD-10-CM

## 2020-12-21 ENCOUNTER — Ambulatory Visit: Payer: Medicaid Other | Admitting: Pharmacist

## 2020-12-23 ENCOUNTER — Ambulatory Visit (INDEPENDENT_AMBULATORY_CARE_PROVIDER_SITE_OTHER): Payer: Medicaid Other | Admitting: Pharmacist

## 2020-12-23 ENCOUNTER — Encounter: Payer: Self-pay | Admitting: Pharmacist

## 2020-12-23 ENCOUNTER — Other Ambulatory Visit: Payer: Self-pay

## 2020-12-23 DIAGNOSIS — E785 Hyperlipidemia, unspecified: Secondary | ICD-10-CM | POA: Diagnosis not present

## 2020-12-23 DIAGNOSIS — E1169 Type 2 diabetes mellitus with other specified complication: Secondary | ICD-10-CM

## 2020-12-23 DIAGNOSIS — E118 Type 2 diabetes mellitus with unspecified complications: Secondary | ICD-10-CM | POA: Diagnosis not present

## 2020-12-23 NOTE — Assessment & Plan Note (Signed)
ASCVD risk - primary prevention in patient with diabetes. Last LDL is not controlled (last LDL 153 mg/dL on 3/87/56) despite high-intensity statin therapy. He has stopped taking rosuvastatin when he started ezetimibe 11/01/2020.  -Restarted rosuvastatin 40 mg daily. -Continued ezetimibe 10 mg daily.  -Recheck Lipid assessment - direct LDL at next blood draw.

## 2020-12-23 NOTE — Patient Instructions (Addendum)
It was nice to see you today!  Your goal blood sugar is 80-130 before eating and less than 180 after eating.  Medication Changes: Resume rosuvastatin 40 mg daily. You need to take BOTH rosuvastatin and ezetimibe.   Continue metformin 1000 mg BID, Jardiance 25 mg daily, Humalog 16 units before supper, Lantus 40 units daily.   Continue Victoza 1.8 mg daily until you run out. On the very next day, start taking Trulicity 0.75 mg ONCE WEEKLY  Monitor blood sugars at home and keep a log (glucometer or piece of paper) to bring with you to your next visit.  Keep up the good work with diet and exercise. Aim for a diet full of vegetables, fruit and lean meats (chicken, [place], fish). Try to limit salt intake by eating fresh or frozen vegetables (instead of canned), rinse canned vegetables prior to cooking and do not add any additional salt to meals.

## 2020-12-23 NOTE — Progress Notes (Signed)
Reviewed: I agree with Dr. Koval's documentation and management. 

## 2020-12-23 NOTE — Assessment & Plan Note (Signed)
Diabetes currently improved based on home CBG readings.  He was uncontrolled with most recent A1c 10.2 (10/26/20), down from 11.6 in March 2022. Patient is able to verbalize appropriate hypoglycemia management plan. Medication adherence appears good. Control is improving since increasing pre-meal insulin. -Continued basal insulin Lantus (insulin glargine) at 40 units daily. -Continued  rapid insulin Humalog (insulin lispro) at 16 units before supper.  -Continued GLP-1 Victoza (generic name liraglutide) at 1.8 mg daily. Finish remaining 40 day supply of Victoza then switch to Trulicity 0.75 mg once weekly for two week. If tolerated, will titrate to dulaglutide (Trulicity) 1.5 mg -Continued SGLT2-I Jardiance (generic name empagliflozin) at 25 mg daily. -Discussed pathophysiology of diabetes, congratulated for joining a gym and encouraged to continue lifestyle modifications, reiterated importance of avoiding sugary drinks -Next A1C anticipated at upcoming PCP visit.

## 2020-12-23 NOTE — Progress Notes (Signed)
S:     Chief Complaint  Patient presents with   Medication Management    Diabetes Follow Up   Patient arrives in good spirits. Presents for diabetes evaluation, education, and management Patient was referred and last seen by Primary Care Provider, Dr. Wynelle Link, on 10/26/20.    Has 4 pens remaining of Victoza (~40 day supply) Has stopped rosuvastatin when he started ezetimibe  Tobacco: Never smoker  Insurance coverage/medication affordability: Medicaid  Medication adherence reported good, no missed doses.   Current diabetes medications include: metformin 1000 mg BID, empagliflozin (Jardiance) 25 mg daily, insulin lispro (Humalog) 16 units before supper, insulin glargine (Lantus) 40 units daily, liraglutide (Victoza) 1.8 mg daily Current hypertension medications include: amlodipine 10 mg daily, lisinopril 40 mg daily, chlorthalidone 25 mg daily Current hyperlipidemia medications include: ezetimibe 10 mg daily  Patient denies hypoglycemic events.  Patient reported dietary habits: Eats 2-3 meals/day Breakfast: eggs, avoids bacon/sausage Lunch: sandwich, no chips  Dinner: baked chicken, greens, 1 piece loaf bread  Snacks: none Drinks: mostly water, has mostly cut out sodas (some Diet [facility])  Patient-reported exercise habits: new membership to gym (2x/week, 30 mins on treadmill)   Patient reports nocturia (nighttime urination). 0-1x/night Patient denies neuropathy (nerve pain). Patient denies visual changes. Patient reports self foot exams.     O:  Physical Exam Constitutional:      Appearance: He is obese.  Cardiovascular:     Rate and Rhythm: Tachycardia present.  Pulmonary:     Effort: Pulmonary effort is normal.  Neurological:     Mental Status: He is alert.  Psychiatric:        Mood and Affect: Mood normal.        Behavior: Behavior normal.        Thought Content: Thought content normal.    Review of Systems  All other systems reviewed and are  negative.  Lab Results  Component Value Date   HGBA1C 10.2 (A) 10/26/2020   Vitals:   12/23/20 0843 12/23/20 0854  BP: 126/78 130/72  Pulse: (!) 103   SpO2: 98%     Lipid Panel     Component Value Date/Time   CHOL 260 (H) 10/26/2020 1028   TRIG 418 (H) 10/26/2020 1028   HDL 28 (L) 10/26/2020 1028   CHOLHDL 9.3 (H) 10/26/2020 1028   CHOLHDL 7.6 (H) 06/25/2015 1050   VLDL 37 (H) 06/25/2015 1050   LDLCALC 153 (H) 10/26/2020 1028    Home fasting blood sugars: 125-130s (lowest 105) - checks late at night, sometimes 200s before dinner 2 hour post-meal/random blood sugars: not checking  Clinical Atherosclerotic Cardiovascular Disease (ASCVD): No  The 10-year ASCVD risk score Denman George DC Jr., et al., 2013) is: 17.3%   Values used to calculate the score:     Age: 45 years     Sex: Male     Is Non-Hispanic African American: Yes     Diabetic: Yes     Tobacco smoker: No     Systolic Blood Pressure: 130 mmHg     Is BP treated: Yes     HDL Cholesterol: 28 mg/dL     Total Cholesterol: 260 mg/dL   A/P: Diabetes currently improved based on home CBG readings.  He was uncontrolled with most recent A1c 10.2 (10/26/20), down from 11.6 in March 2022. Patient is able to verbalize appropriate hypoglycemia management plan. Medication adherence appears good. Control is improving since increasing pre-meal insulin. -Continued basal insulin Lantus (insulin glargine) at 40  units daily. -Continued  rapid insulin Humalog (insulin lispro) at 16 units before supper.  -Continued GLP-1 Victoza (generic name liraglutide) at 1.8 mg daily. Finish remaining 40 day supply of Victoza then switch to Trulicity 0.75 mg once weekly for two week. If tolerated, will titrate to dulaglutide (Trulicity) 1.5 mg -Continued SGLT2-I Jardiance (generic name empagliflozin) at 25 mg daily. -Discussed pathophysiology of diabetes, congratulated for joining a gym and encouraged to continue lifestyle modifications, reiterated  importance of avoiding sugary drinks -Next A1C anticipated at upcoming PCP visit.   ASCVD risk - primary prevention in patient with diabetes. Last LDL is not controlled (last LDL 153 mg/dL on 9/47/09) despite high-intensity statin therapy. He has stopped taking rosuvastatin when he started ezetimibe 11/01/2020.  -Restarted rosuvastatin 40 mg daily. -Continued ezetimibe 10 mg daily.  -Recheck Lipid assessment - direct LDL at next blood draw.  Written patient instructions provided.  Total time in face to face counseling 40 minutes.   Anticipate follow up Rx clinic 2-3 weeks after PCP visit in 3-4 weeks.

## 2021-01-09 ENCOUNTER — Encounter (HOSPITAL_BASED_OUTPATIENT_CLINIC_OR_DEPARTMENT_OTHER): Payer: Medicaid Other | Admitting: Internal Medicine

## 2021-01-27 ENCOUNTER — Other Ambulatory Visit: Payer: Self-pay | Admitting: Family Medicine

## 2021-01-27 DIAGNOSIS — E118 Type 2 diabetes mellitus with unspecified complications: Secondary | ICD-10-CM

## 2021-01-27 MED ORDER — TIZANIDINE HCL 4 MG PO TABS: ORAL_TABLET | ORAL | 1 refills | Status: DC

## 2021-02-16 ENCOUNTER — Ambulatory Visit: Payer: Medicaid Other | Admitting: Podiatry

## 2021-02-28 ENCOUNTER — Other Ambulatory Visit: Payer: Self-pay | Admitting: Family Medicine

## 2021-03-01 ENCOUNTER — Emergency Department (HOSPITAL_COMMUNITY)
Admission: EM | Admit: 2021-03-01 | Discharge: 2021-03-01 | Disposition: A | Discharge status: Home / Self Care | Payer: Medicaid Other | Attending: Emergency Medicine | Admitting: Emergency Medicine

## 2021-03-01 ENCOUNTER — Other Ambulatory Visit: Payer: Self-pay

## 2021-03-01 ENCOUNTER — Emergency Department (HOSPITAL_COMMUNITY): Payer: Medicaid Other

## 2021-03-01 ENCOUNTER — Encounter (HOSPITAL_COMMUNITY): Payer: Self-pay | Admitting: Emergency Medicine

## 2021-03-01 DIAGNOSIS — D649 Anemia, unspecified: Secondary | ICD-10-CM | POA: Diagnosis not present

## 2021-03-01 DIAGNOSIS — R000 Tachycardia, unspecified: Secondary | ICD-10-CM | POA: Diagnosis not present

## 2021-03-01 DIAGNOSIS — E1169 Type 2 diabetes mellitus with other specified complication: Secondary | ICD-10-CM | POA: Insufficient documentation

## 2021-03-01 DIAGNOSIS — Z7984 Long term (current) use of oral hypoglycemic drugs: Secondary | ICD-10-CM | POA: Insufficient documentation

## 2021-03-01 DIAGNOSIS — I10 Essential (primary) hypertension: Secondary | ICD-10-CM | POA: Insufficient documentation

## 2021-03-01 DIAGNOSIS — E871 Hypo-osmolality and hyponatremia: Secondary | ICD-10-CM | POA: Insufficient documentation

## 2021-03-01 DIAGNOSIS — Z79899 Other long term (current) drug therapy: Secondary | ICD-10-CM | POA: Diagnosis not present

## 2021-03-01 DIAGNOSIS — K219 Gastro-esophageal reflux disease without esophagitis: Secondary | ICD-10-CM | POA: Insufficient documentation

## 2021-03-01 DIAGNOSIS — Z794 Long term (current) use of insulin: Secondary | ICD-10-CM | POA: Insufficient documentation

## 2021-03-01 DIAGNOSIS — R1010 Upper abdominal pain, unspecified: Secondary | ICD-10-CM | POA: Diagnosis present

## 2021-03-01 DIAGNOSIS — R0789 Other chest pain: Secondary | ICD-10-CM

## 2021-03-01 LAB — BASIC METABOLIC PANEL
Anion gap: 11 (ref 5–15)
BUN: 23 mg/dL — ABNORMAL HIGH (ref 6–20)
CO2: 21 mmol/L — ABNORMAL LOW (ref 22–32)
Calcium: 9.1 mg/dL (ref 8.9–10.3)
Chloride: 101 mmol/L (ref 98–111)
Creatinine, Ser: 1.77 mg/dL — ABNORMAL HIGH (ref 0.61–1.24)
GFR, Estimated: 47 mL/min — ABNORMAL LOW (ref >60)
Glucose, Bld: 293 mg/dL — ABNORMAL HIGH (ref 70–99)
Potassium: 4.6 mmol/L (ref 3.5–5.1)
Sodium: 133 mmol/L — ABNORMAL LOW (ref 135–145)

## 2021-03-01 LAB — TROPONIN I (HIGH SENSITIVITY)
Troponin I (High Sensitivity): 4 ng/L (ref <18)
Troponin I (High Sensitivity): 4 ng/L (ref <18)

## 2021-03-01 LAB — CBC
HCT: 36.2 % — ABNORMAL LOW (ref 39.0–52.0)
Hemoglobin: 11.4 g/dL — ABNORMAL LOW (ref 13.0–17.0)
MCH: 29.2 pg (ref 26.0–34.0)
MCHC: 31.5 g/dL (ref 30.0–36.0)
MCV: 92.8 fL (ref 80.0–100.0)
Platelets: 340 10*3/uL (ref 150–400)
RBC: 3.9 MIL/uL — ABNORMAL LOW (ref 4.22–5.81)
RDW: 12 % (ref 11.5–15.5)
WBC: 6.4 10*3/uL (ref 4.0–10.5)
nRBC: 0 % (ref 0.0–0.2)

## 2021-03-01 MED ORDER — OMEPRAZOLE 20 MG PO CPDR: 20.0000 mg | DELAYED_RELEASE_CAPSULE | Freq: Every day | ORAL | 0 refills | Status: DC

## 2021-03-01 NOTE — ED Triage Notes (Signed)
Pt states that he has had intermittent chest pain x 4 days. Alert and oriented.

## 2021-03-01 NOTE — ED Provider Notes (Signed)
Emergency Medicine Provider Triage Evaluation Note  Marcus [name] , a 45 y.o. adult  was evaluated in triage.  Pt complains of intermittent chest pain over the last 3 days.  Describes this chest pain as a pressure sensation.  It is worse with exertion and better with rest.  He rates his chest pain mild in severity.  He denies any other symptoms including nausea, vomiting, diarrhea, diaphoresis, loss of consciousness, leg pain, leg swelling.  Review of Systems  Positive:  Negative: See above   Physical Exam  BP 104/73 (BP Location: Right Arm)   Pulse (!) 110   Temp 98.3 F (36.8 C) (Oral)   Resp 19   Ht 5\' 7"  (1.702 m)   Wt 117.9 kg   SpO2 98%   BMI 40.72 kg/m  Gen:   Awake, no distress   Resp:  Normal effort  MSK:   Moves extremities without difficulty  Other:    Medical Decision Making  Medically screening exam initiated at 4:58 PM.  Appropriate orders placed.  was informed that the remainder of the evaluation will be completed by another provider, this initial triage assessment does not replace that evaluation, and the importance of remaining in the ED until their evaluation is complete.     Katherine Basset Baldwin, PA-C 03/01/21 1659    03/03/21, MD 03/02/21 1246
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
## 2021-03-01 NOTE — Discharge Instructions (Addendum)
You were seen in the ER for your central chest pressure.  Your physical exam and vital signs is very reassuring as was your blood work.  There does not appear to be any emergent problem with your heart or lungs.  Suspect you are experiencing heartburn with inflammation of your esophagus.  Been prescribed a medication to treat this which is to take every day for the next month.  Please follow-up with your primary care doctor and return to the ER with any new severe symptoms.  Additionally below is the contact information for the heart doctor with whom you may follow-up as needed.

## 2021-03-03 ENCOUNTER — Other Ambulatory Visit: Payer: Self-pay | Admitting: Family Medicine

## 2021-03-07 ENCOUNTER — Ambulatory Visit (INDEPENDENT_AMBULATORY_CARE_PROVIDER_SITE_OTHER): Payer: Medicaid Other | Admitting: Family Medicine

## 2021-03-07 ENCOUNTER — Other Ambulatory Visit: Payer: Self-pay

## 2021-03-07 DIAGNOSIS — E118 Type 2 diabetes mellitus with unspecified complications: Secondary | ICD-10-CM | POA: Diagnosis not present

## 2021-03-07 MED ORDER — OMEPRAZOLE 20 MG PO CPDR: 20.0000 mg | DELAYED_RELEASE_CAPSULE | Freq: Every day | ORAL | 3 refills | Status: DC

## 2021-03-07 MED ORDER — CETIRIZINE HCL 10 MG PO TABS: 10.0000 mg | ORAL_TABLET | Freq: Every day | ORAL | 3 refills | Status: DC

## 2021-03-07 MED ORDER — CHLORTHALIDONE 25 MG PO TABS: 25.0000 mg | ORAL_TABLET | Freq: Every day | ORAL | 1 refills | Status: DC

## 2021-03-07 NOTE — Progress Notes (Deleted)
    SUBJECTIVE:   CHIEF COMPLAINT / HPI:   ***  PERTINENT  PMH / PSH: ***  OBJECTIVE:   Temp 98.1 F (36.7 C)   Ht 5\' 7"  (1.702 m)   SpO2 99%   BMI 40.72 kg/m   ***  ASSESSMENT/PLAN:   No problem-specific Assessment & Plan notes found for this encounter.     , DO [facility]

## 2021-03-07 NOTE — Patient Instructions (Signed)
Everything looks good today, I have sent in more of the omeprazole. Please let our office know if the pain comes back.  Please make an appointment with your Primary Care Doctor to follow-up on your diabetes.

## 2021-03-07 NOTE — Progress Notes (Signed)
    SUBJECTIVE:   CHIEF COMPLAINT / HPI:   Patient reports that he recently has had chest pain.  He was worked up at the ED on 10/18 with no concern for cardiac etiology as troponin was negative x2 and all other labs appear to close to baseline.  Patient was prescribed omeprazole with significant improvement in his symptoms.  He reports that he has not had any further episodes of the chest pain/pressure and belching's have resolved.  Patient does not have any other concerns at this time-he notes he previously did have congestion but it has since gone away. Patient reports baseline tachycardia  PERTINENT  PMH / PSH: Reviewed  OBJECTIVE:   Temp 98.1 F (36.7 C)   Ht 5\' 7"  (1.702 m)   SpO2 99%   BMI 40.72 kg/m   Gen: well-appearing, NAD CV: RRR, no m/r/g appreciated, no peripheral edema Pulm: CTAB, no wheezes/crackles GI: soft, non-tender, non-distended  ASSESSMENT/PLAN:   Reflux Refill patient's omeprazole.  Gave patient strict return precautions if chest pain changes or recurs and has not improved with the medication.  Seasonal allergies Patient initially requested referral to allergist, does not need specific allergy shots and is just wanting oral medications.  Patient has previously taken Zyrtec with improvement, represcribed Zyrtec.  Discussed with patient to follow-up with his next visit with his PCP for determining effectiveness.  Type 2 diabetes Discussed with patient following up with his PCP.  Patient is due for his A1c and we did not have time to complete at this visit.  Discussed with patient that he is due for an ophthalmology exam.  Patient is already on a statin medication.  HTN BP in the office today 113/79.  Patient requested refill on his chlorthalidone, refill provided.  , DO [facility]

## 2021-03-16 ENCOUNTER — Ambulatory Visit: Payer: Medicaid Other | Admitting: Podiatry

## 2021-03-21 ENCOUNTER — Other Ambulatory Visit: Payer: Self-pay

## 2021-03-21 ENCOUNTER — Ambulatory Visit (INDEPENDENT_AMBULATORY_CARE_PROVIDER_SITE_OTHER): Payer: Medicaid Other | Admitting: Podiatry

## 2021-03-21 ENCOUNTER — Encounter: Payer: Self-pay | Admitting: Podiatry

## 2021-03-21 DIAGNOSIS — N179 Acute kidney failure, unspecified: Secondary | ICD-10-CM

## 2021-03-21 DIAGNOSIS — B351 Tinea unguium: Secondary | ICD-10-CM

## 2021-03-21 DIAGNOSIS — M79676 Pain in unspecified toe(s): Secondary | ICD-10-CM | POA: Diagnosis not present

## 2021-03-21 DIAGNOSIS — E1140 Type 2 diabetes mellitus with diabetic neuropathy, unspecified: Secondary | ICD-10-CM | POA: Diagnosis not present

## 2021-03-21 DIAGNOSIS — E1149 Type 2 diabetes mellitus with other diabetic neurological complication: Secondary | ICD-10-CM

## 2021-03-21 NOTE — Progress Notes (Signed)
Complaint:  Visit Type: Patient returns to my office for continued preventative foot care services. Complaint: Patient states" my nails have grown long and thick and become painful to walk and wear shoes" Patient has been diagnosed with DM with no foot complications. The patient presents for preventative foot care services. No changes to ROS  Podiatric Exam: Vascular: dorsalis pedis and posterior tibial pulses are palpable bilateral. Capillary return is immediate. Temperature gradient is WNL. Skin turgor WNL  Sensorium: Normal Semmes Weinstein monofilament test. Normal tactile sensation bilaterally. Nail Exam: Pt has thick disfigured discolored nails with subungual debris noted bilateral entire nail hallux through fifth toenails Ulcer Exam: There is no evidence of ulcer or pre-ulcerative changes or infection. Orthopedic Exam: Muscle tone and strength are WNL. No limitations in general ROM. No crepitus or effusions noted. Foot type and digits show no abnormalities. Bony prominences are unremarkable. Skin: No Porokeratosis. No infection or ulcers  Diagnosis:  Onychomycosis, , Pain in right toe, pain in left toes  Treatment & Plan Procedures and Treatment: Consent by patient was obtained for treatment procedures.   Debridement of mycotic and hypertrophic toenails, 1 through 5 bilateral and clearing of subungual debris using a nail nipper and then dremel tool. . No ulceration, no infection noted.  Return Visit-Office Procedure: Patient instructed to return to the office for a follow up visit 4 months for continued evaluation and treatment.    Roena Sassaman DPM 

## 2021-03-31 NOTE — Patient Instructions (Incomplete)
It was nice seeing you today! ° ° ° °Please arrive at least 15 minutes prior to your scheduled appointments. ° °Stay well, °Nat Lowenthal, MD °[facility] °[phone]  °

## 2021-03-31 NOTE — Progress Notes (Deleted)
    SUBJECTIVE:   CHIEF COMPLAINT / HPI: physical  T2DM Takes metformin 1000 mg BID, Victoza 1.8 mg daily (switched to Trulicity), empagliflozin 25 mg, Lantus 40 units daily, Humalog 16 units before supper  PERTINENT  PMH / PSH: T2DM, HTN, bipolar disorder, migraine, HLD  OBJECTIVE:   There were no vitals taken for this visit.  General: ***, NAD CV: RRR, no murmurs*** Pulm: CTAB, no wheezes or rales  ASSESSMENT/PLAN:   No problem-specific Assessment & Plan notes found for this encounter.     [name], MD [facility]   {    This will disappear when note is signed, click to select method of visit    :1}

## 2021-04-01 ENCOUNTER — Encounter: Payer: Medicaid Other | Admitting: Family Medicine

## 2021-04-19 ENCOUNTER — Other Ambulatory Visit: Payer: Self-pay | Admitting: Family Medicine

## 2021-04-26 ENCOUNTER — Other Ambulatory Visit: Payer: Self-pay | Admitting: Family Medicine

## 2021-04-26 DIAGNOSIS — E118 Type 2 diabetes mellitus with unspecified complications: Secondary | ICD-10-CM

## 2021-05-02 NOTE — Progress Notes (Deleted)
° ° °  SUBJECTIVE:   CHIEF COMPLAINT / HPI: physical  T2DM Takes metformin 1000 mg BID, empagliflozin 25 mg, Lantus 40 units, Humalog 16 units before supper. Switched to Trulicity 0.75 mg once weekly at last visit with pharmacy clinic 4 months ago.  PERTINENT  PMH / PSH: ***  OBJECTIVE:   There were no vitals taken for this visit.  General: ***, NAD CV: RRR, no murmurs*** Pulm: CTAB, no wheezes or rales  ASSESSMENT/PLAN:   No problem-specific Assessment & Plan notes found for this encounter.     [name], MD [facility]   {    This will disappear when note is signed, click to select method of visit    :1}

## 2021-05-04 ENCOUNTER — Encounter: Payer: Medicaid Other | Admitting: Family Medicine

## 2021-05-16 ENCOUNTER — Other Ambulatory Visit: Payer: Self-pay | Admitting: Family Medicine

## 2021-05-17 ENCOUNTER — Ambulatory Visit (INDEPENDENT_AMBULATORY_CARE_PROVIDER_SITE_OTHER): Payer: Medicaid Other | Admitting: Family Medicine

## 2021-05-17 ENCOUNTER — Encounter: Payer: Self-pay | Admitting: Family Medicine

## 2021-05-17 ENCOUNTER — Other Ambulatory Visit: Payer: Self-pay

## 2021-05-17 VITALS — BP 103/80 | HR 117 | Ht 67.0 in | Wt 263.4 lb

## 2021-05-17 DIAGNOSIS — N183 Chronic kidney disease, stage 3 unspecified: Secondary | ICD-10-CM

## 2021-05-17 DIAGNOSIS — E1122 Type 2 diabetes mellitus with diabetic chronic kidney disease: Secondary | ICD-10-CM

## 2021-05-17 DIAGNOSIS — E785 Hyperlipidemia, unspecified: Secondary | ICD-10-CM | POA: Diagnosis not present

## 2021-05-17 DIAGNOSIS — E1169 Type 2 diabetes mellitus with other specified complication: Secondary | ICD-10-CM | POA: Diagnosis not present

## 2021-05-17 DIAGNOSIS — I10 Essential (primary) hypertension: Secondary | ICD-10-CM

## 2021-05-17 DIAGNOSIS — E118 Type 2 diabetes mellitus with unspecified complications: Secondary | ICD-10-CM

## 2021-05-17 LAB — POCT GLYCOSYLATED HEMOGLOBIN (HGB A1C): HbA1c, POC (controlled diabetic range): 12.6 % — AB (ref 0.0–7.0)

## 2021-05-17 MED ORDER — TRULICITY 0.75 MG/0.5ML ~~LOC~~ SOAJ: 0.7500 mg | SUBCUTANEOUS | 0 refills | Status: DC

## 2021-05-17 NOTE — Assessment & Plan Note (Signed)
Checking LDL today. Reports compliance to statin and ezetimibe.

## 2021-05-17 NOTE — Progress Notes (Signed)
° ° °  SUBJECTIVE:   CHIEF COMPLAINT / HPI: physical   T2DM Takes metformin 1000 mg BID, empagliflozin 25 mg, Lantus 40 units, Humalog 16 units before supper. Switched to Trulicity [id number] mg once weekly at last visit with pharmacy clinic 4 months ago.  Today, patient states he just started taking the Humalog last week as he thought he was supposed to wait and take this once a week. Still taking the empagliflozin and Lantus 40 units. He ran out of the Victoza last week but states he never got any Trulicity so never started this. Fasting CBG 190s-200s Reports symptomatic hypoglycemia a few days/week - sugars in the 80s when this happens, usually overnight  PERTINENT  PMH / PSH: T2DM, HLD, obesity, bipolar disorder, HTN  OBJECTIVE:   BP 103/80    Pulse (!) 117    Ht 5\' 7"  (1.702 m)    Wt 263 lb 6.4 oz (119.5 kg)    SpO2 98%    BMI 41.25 kg/m   General: Obese middle-age male, NAD CV: Tachycardic, no murmurs Pulm: CTAB, no wheezes or rales  ASSESSMENT/PLAN:   Morbid obesity Information given for healthy weight and wellness  Hyperlipidemia associated with type 2 diabetes mellitus (HCC) Checking LDL today. Reports compliance to statin and ezetimibe.  Type 2 diabetes mellitus with complication (HCC) Poorly controlled, worsening.  It appears he has not been taking his medications correctly.  Never received Trulicity and reports only recently starting Humalog to take once a week so there may have been some confusion regarding his medications.  Also still having symptomatic hypoglycemia a few days a week.  Counseled on the correct dosing of medications, also sent in prescription for Trulicity.  We will have him follow-up in pharmacy clinic in 2 weeks.  Hopefully can come down on basal insulin dose once mealtime insulin is optimized.  CKD stage 3 due to type 2 diabetes mellitus (HCC) Persistently elevated serum creatinine levels over the past year more consistent with CKD rather than AKI.  He is  already on ACE inhibitor and SGLT2 inhibitor for renal protection.  Elevated over baseline at most recent BMP, will recheck today.   Zola Button, MD [place]

## 2021-05-17 NOTE — Assessment & Plan Note (Signed)
Information given for healthy weight and wellness. °

## 2021-05-17 NOTE — Assessment & Plan Note (Signed)
Poorly controlled, worsening.  It appears he has not been taking his medications correctly.  Never received Trulicity and reports only recently starting Humalog to take once a week so there may have been some confusion regarding his medications.  Also still having symptomatic hypoglycemia a few days a week.  Counseled on the correct dosing of medications, also sent in prescription for Trulicity.  We will have him follow-up in pharmacy clinic in 2 weeks.  Hopefully can come down on basal insulin dose once mealtime insulin is optimized.

## 2021-05-17 NOTE — Assessment & Plan Note (Addendum)
Persistently elevated serum creatinine levels over the past year more consistent with CKD rather than AKI.  He is already on ACE inhibitor and SGLT2 inhibitor for renal protection.  Elevated over baseline at most recent BMP, will recheck today.

## 2021-05-17 NOTE — Patient Instructions (Addendum)
It was nice seeing you today!  Take Trulicity once a week.  Take Humalog once a day with your biggest meal. Check your blood sugar 2-3 hours after eating.  Continue your other medications as usual.  See the pharmacy clinic in the next 2 weeks.  [facility], [place], [place] [address] [phone]  Please arrive at least 15 minutes prior to your scheduled appointments.  Stay well, Zola Button, MD [place] [phone]

## 2021-05-18 ENCOUNTER — Telehealth: Payer: Self-pay | Admitting: Family Medicine

## 2021-05-18 DIAGNOSIS — E875 Hyperkalemia: Secondary | ICD-10-CM

## 2021-05-18 LAB — BASIC METABOLIC PANEL
BUN/Creatinine Ratio: 14 (ref 9–20)
BUN: 20 mg/dL (ref 6–24)
CO2: 22 mmol/L (ref 20–29)
Calcium: 9.8 mg/dL (ref 8.7–10.2)
Chloride: 99 mmol/L (ref 96–106)
Creatinine, Ser: 1.38 mg/dL — ABNORMAL HIGH (ref 0.76–1.27)
Glucose: 305 mg/dL — ABNORMAL HIGH (ref 70–99)
Potassium: 5.4 mmol/L — ABNORMAL HIGH (ref 3.5–5.2)
Sodium: 135 mmol/L (ref 134–144)
eGFR: 64 mL/min/{1.73_m2} (ref >59)

## 2021-05-18 LAB — LDL CHOLESTEROL, DIRECT: LDL Direct: 131 mg/dL — ABNORMAL HIGH (ref 0–99)

## 2021-05-18 NOTE — Telephone Encounter (Signed)
Call patient to discuss lab results.   LDL is elevated but improving from prior.  Discussed lifestyle changes such as reducing saturated fats (fried foods, red meats).  Creatinine appears to be back at baseline around 1.3.  Potassium is mildly elevated at 5.4.  Suspect this may be due to lab error as this has been an issue at the lab.  We will send in BMP order to have this done at [facility] across the street from clinic.  He will plan to go later this week.

## 2021-06-01 ENCOUNTER — Other Ambulatory Visit: Payer: Self-pay | Admitting: Family Medicine

## 2021-06-02 ENCOUNTER — Ambulatory Visit: Payer: Medicaid Other | Admitting: Pharmacist

## 2021-06-16 ENCOUNTER — Other Ambulatory Visit: Payer: Self-pay | Admitting: Family Medicine

## 2021-07-04 ENCOUNTER — Emergency Department (HOSPITAL_COMMUNITY): Payer: Medicaid Other

## 2021-07-04 ENCOUNTER — Encounter (HOSPITAL_COMMUNITY): Payer: Self-pay

## 2021-07-04 ENCOUNTER — Emergency Department (HOSPITAL_COMMUNITY)
Admission: EM | Admit: 2021-07-04 | Discharge: 2021-07-04 | Disposition: A | Discharge status: Home / Self Care | Payer: Medicaid Other | Attending: Emergency Medicine | Admitting: Emergency Medicine

## 2021-07-04 ENCOUNTER — Other Ambulatory Visit: Payer: Self-pay

## 2021-07-04 ENCOUNTER — Encounter: Payer: Self-pay | Admitting: Family Medicine

## 2021-07-04 ENCOUNTER — Ambulatory Visit (INDEPENDENT_AMBULATORY_CARE_PROVIDER_SITE_OTHER): Payer: Medicaid Other | Admitting: Family Medicine

## 2021-07-04 VITALS — BP 144/90 | HR 118 | Wt 265.0 lb

## 2021-07-04 DIAGNOSIS — K219 Gastro-esophageal reflux disease without esophagitis: Secondary | ICD-10-CM | POA: Insufficient documentation

## 2021-07-04 DIAGNOSIS — R079 Chest pain, unspecified: Secondary | ICD-10-CM

## 2021-07-04 DIAGNOSIS — I129 Hypertensive chronic kidney disease with stage 1 through stage 4 chronic kidney disease, or unspecified chronic kidney disease: Secondary | ICD-10-CM | POA: Insufficient documentation

## 2021-07-04 DIAGNOSIS — E1122 Type 2 diabetes mellitus with diabetic chronic kidney disease: Secondary | ICD-10-CM | POA: Diagnosis not present

## 2021-07-04 DIAGNOSIS — R0609 Other forms of dyspnea: Secondary | ICD-10-CM

## 2021-07-04 DIAGNOSIS — R000 Tachycardia, unspecified: Secondary | ICD-10-CM | POA: Insufficient documentation

## 2021-07-04 DIAGNOSIS — N183 Chronic kidney disease, stage 3 unspecified: Secondary | ICD-10-CM | POA: Diagnosis not present

## 2021-07-04 DIAGNOSIS — R062 Wheezing: Secondary | ICD-10-CM | POA: Diagnosis not present

## 2021-07-04 DIAGNOSIS — Z794 Long term (current) use of insulin: Secondary | ICD-10-CM | POA: Diagnosis not present

## 2021-07-04 DIAGNOSIS — Z20822 Contact with and (suspected) exposure to covid-19: Secondary | ICD-10-CM | POA: Insufficient documentation

## 2021-07-04 DIAGNOSIS — R0602 Shortness of breath: Secondary | ICD-10-CM | POA: Insufficient documentation

## 2021-07-04 DIAGNOSIS — Z79899 Other long term (current) drug therapy: Secondary | ICD-10-CM | POA: Diagnosis not present

## 2021-07-04 LAB — BASIC METABOLIC PANEL
Anion gap: 13 (ref 5–15)
BUN: 17 mg/dL (ref 6–20)
CO2: 19 mmol/L — ABNORMAL LOW (ref 22–32)
Calcium: 9.4 mg/dL (ref 8.9–10.3)
Chloride: 103 mmol/L (ref 98–111)
Creatinine, Ser: 1.66 mg/dL — ABNORMAL HIGH (ref 0.61–1.24)
GFR, Estimated: 51 mL/min — ABNORMAL LOW (ref >60)
Glucose, Bld: 199 mg/dL — ABNORMAL HIGH (ref 70–99)
Potassium: 5 mmol/L (ref 3.5–5.1)
Sodium: 135 mmol/L (ref 135–145)

## 2021-07-04 LAB — CBC
HCT: 40.2 % (ref 39.0–52.0)
Hemoglobin: 12.5 g/dL — ABNORMAL LOW (ref 13.0–17.0)
MCH: 28.9 pg (ref 26.0–34.0)
MCHC: 31.1 g/dL (ref 30.0–36.0)
MCV: 93.1 fL (ref 80.0–100.0)
Platelets: 343 10*3/uL (ref 150–400)
RBC: 4.32 MIL/uL (ref 4.22–5.81)
RDW: 13.1 % (ref 11.5–15.5)
WBC: 5 10*3/uL (ref 4.0–10.5)
nRBC: 0 % (ref 0.0–0.2)

## 2021-07-04 LAB — GLUCOSE, CAPILLARY: Glucose-Capillary: 127 mg/dL — ABNORMAL HIGH (ref 70–99)

## 2021-07-04 LAB — TROPONIN I (HIGH SENSITIVITY)
Troponin I (High Sensitivity): 4 ng/L (ref <18)
Troponin I (High Sensitivity): 4 ng/L (ref <18)

## 2021-07-04 LAB — RESP PANEL BY RT-PCR (FLU A&B, COVID) ARPGX2
Influenza A by PCR: NEGATIVE
Influenza B by PCR: NEGATIVE
SARS Coronavirus 2 by RT PCR: NEGATIVE

## 2021-07-04 LAB — D-DIMER, QUANTITATIVE: D-Dimer, Quant: 0.52 ug/mL-FEU — ABNORMAL HIGH (ref 0.00–0.50)

## 2021-07-04 LAB — BRAIN NATRIURETIC PEPTIDE: B Natriuretic Peptide: 8.3 pg/mL (ref 0.0–100.0)

## 2021-07-04 MED ORDER — LISINOPRIL 20 MG PO TABS
40.0000 mg | ORAL_TABLET | Freq: Once | ORAL | Status: AC
  Administered 2021-07-04: 40 mg via ORAL
  Filled 2021-07-04: qty 2

## 2021-07-04 MED ORDER — TECHNETIUM TO 99M ALBUMIN AGGREGATED
4.2000 | Freq: Once | INTRAVENOUS | Status: AC | PRN
  Administered 2021-07-04: 4.2 via INTRAVENOUS

## 2021-07-04 MED ORDER — AMLODIPINE BESYLATE 5 MG PO TABS
10.0000 mg | ORAL_TABLET | Freq: Once | ORAL | Status: AC
  Administered 2021-07-04: 10 mg via ORAL
  Filled 2021-07-04: qty 2

## 2021-07-04 NOTE — ED Provider Notes (Signed)
[facility] EMERGENCY DEPARTMENT Provider Note   CSN: [id number] Arrival date & time: 07/04/21  0901     History  Chief Complaint  Patient presents with   Shortness of Breath   Gastroesophageal Reflux    Marcus [name] is a 46 y.o. adult with a past medical history of type 2 diabetes, hypertension and per chart review, persistent sinus tachycardia and CKD 3 presenting today from pcp due to chest pain, shortness of breath and leg swelling.  Patient's wife reports that he has been complaining of chest pain for the past month however it appears to be worsening.  Chest pain comes on with exertion, as well as shortness of breath.  He says that this has become more constant since he started Trulicity for his diabetes.  Over the last 4 days they have noted bilateral lower extremity swelling.  Patient denies a history of heart failure or kidney disease.  No history of DVT, recent surgery or travel.  He has not had any of his chronic medications today.   Home Medications Prior to Admission medications   Medication Sig Start Date End Date Taking? Authorizing Provider  amLODipine (NORVASC) 10 MG tablet TAKE 1 TABLET(10 MG) BY MOUTH AT BEDTIME 11/30/20   Zola Button, MD  B-D ULTRAFINE III SHORT PEN 31G X 8 MM MISC USE AS DIRECTED TO INJECT INSULIN 03/01/21   Zola Button, MD  Blood Glucose Monitoring Suppl (ACCU-CHEK GUIDE) w/Device KIT USE TO TEST BLOOD SUGAR THREE TIMES DAILY WITH MEALS AS NEEDED 07/28/20   Zola Button, MD  cetirizine (ZYRTEC) 10 MG tablet Take 1 tablet (10 mg total) by mouth daily. 03/07/21   Lilland, Alana, DO  chlorthalidone (HYGROTON) 25 MG tablet Take 1 tablet (25 mg total) by mouth daily. 03/07/21   Lilland, Alana, DO  empagliflozin (JARDIANCE) 25 MG TABS tablet Take 1 tablet (25 mg total) by mouth daily. 12/03/20   Zola Button, MD  ezetimibe (ZETIA) 10 MG tablet Take 1 tablet (10 mg total) by mouth daily. 11/01/20   Zola Button, MD  glucose blood test  strip Please use to check sugars before each meal and prior to bedtime. 02/14/19   Guadalupe Dawn, MD  insulin lispro (HUMALOG KWIKPEN) 100 UNIT/ML KwikPen Inject 16 Units into the skin daily before supper. 11/23/20   Zenia Resides, MD  Lancets Misc. (ACCU-CHEK FASTCLIX LANCET) KIT 1 Units by Does not apply route as needed. 09/03/17   Guadalupe Dawn, MD  LANTUS SOLOSTAR 100 UNIT/ML Solostar Pen ADMINISTER 40 UNITS UNDER THE SKIN DAILY 04/26/21   Zola Button, MD  lisinopril (ZESTRIL) 40 MG tablet TAKE 1 TABLET(40 MG) BY MOUTH DAILY 06/16/21   Zola Button, MD  metFORMIN (GLUCOPHAGE) 1000 MG tablet TAKE 1 TABLET(1000 MG) BY MOUTH TWICE DAILY WITH A MEAL 12/17/20   Zola Button, MD  omeprazole (PRILOSEC) 20 MG capsule Take 1 capsule (20 mg total) by mouth daily. 03/07/21 04/06/21  Lilland, Alana, DO  rosuvastatin (CRESTOR) 40 MG tablet Take 1 tablet (40 mg total) by mouth daily. Patient not taking: Reported on 12/23/2020 01/02/20   Autry-Lott, Naaman Plummer, DO  tiZANidine (ZANAFLEX) 4 MG tablet TAKE 1 TABLET(4 MG) BY MOUTH AT BEDTIME 05/17/21   Zola Button, MD  TRULICITY 6.44 IH/4.7QQ SOPN ADMINISTER 0.75 MG UNDER THE SKIN 1 TIME A WEEK 06/01/21   Zola Button, MD  zinc gluconate 50 MG tablet Take 50 mg by mouth daily.    [provider]      Allergies  Shellfish allergy, Ivp dye [iodinated contrast media], and Vicodin [hydrocodone-acetaminophen]    Review of Systems   Review of Systems  Respiratory:  Positive for shortness of breath.   See HPI  Physical Exam Updated Vital Signs BP (!) 147/99    Pulse (!) 109    Temp 98.9 F (37.2 C)    Resp 14    SpO2 98%  Physical Exam Vitals and nursing note reviewed.  Constitutional:      General: He is not in acute distress.    Appearance: Normal appearance. He is obese. He is not ill-appearing.  HENT:     Head: Normocephalic and atraumatic.  Eyes:     General: No scleral icterus.    Conjunctiva/sclera: Conjunctivae normal.  Cardiovascular:      Rate and Rhythm: Regular rhythm. Tachycardia present.     Comments: 1+ DP pulses bilaterally Pulmonary:     Effort: Pulmonary effort is normal. No respiratory distress.     Breath sounds: No wheezing.  Skin:    General: Skin is warm and dry.     Findings: No rash.  Neurological:     Mental Status: He is alert.  Psychiatric:        Mood and Affect: Mood normal.    ED Results / Procedures / Treatments   Labs (all labs ordered are listed, but only abnormal results are displayed) Labs Reviewed  BASIC METABOLIC PANEL - Abnormal; Notable for the following components:      Result Value   CO2 19 (*)    Glucose, Bld 199 (*)    Creatinine, Ser 1.66 (*)    GFR, Estimated 51 (*)    All other components within normal limits  CBC - Abnormal; Notable for the following components:   Hemoglobin 12.5 (*)    All other components within normal limits  D-DIMER, QUANTITATIVE - Abnormal; Notable for the following components:   D-Dimer, Quant 0.52 (*)    All other components within normal limits  RESP PANEL BY RT-PCR (FLU A&B, COVID) ARPGX2  BRAIN NATRIURETIC PEPTIDE  CBG MONITORING, ED  TROPONIN I (HIGH SENSITIVITY)  TROPONIN I (HIGH SENSITIVITY)    EKG EKG Interpretation  Date/Time:  Monday July 04 2021 09:31:06 EST Ventricular Rate:  104 PR Interval:  133 QRS Duration: 68 QT Interval:  325 QTC Calculation: 428 R Axis:   53 Text Interpretation: Sinus tachycardia Low voltage, precordial leads No significant change since last tracing Confirmed by Blanchie Dessert [phone]) on 07/04/2021 10:24:44 AM  Radiology DG Chest 2 View  Result Date: 07/04/2021 CLINICAL DATA:  Shortness of breath.  Chest pain. EXAM: CHEST - 2 VIEW COMPARISON:  03/01/2021 FINDINGS: Heart size is normal. There is focal atelectasis or pleural thickening at the RIGHT lung base, possibly representing scar. No pulmonary edema. No focal consolidations. IMPRESSION: Atelectasis or scarring at the RIGHT lung base. No  evidence for acute abnormality. Electronically Signed   By: Nolon Nations M.D.   On: 07/04/2021 09:55   NM Pulmonary Perfusion  Result Date: 07/04/2021 CLINICAL DATA:  Positive D-dimer.  Pulmonary embolism suspected. EXAM: NUCLEAR MEDICINE PERFUSION LUNG SCAN TECHNIQUE: Perfusion images were obtained in multiple projections after intravenous injection of radiopharmaceutical. Ventilation scans intentionally deferred if perfusion scan and chest x-ray adequate for interpretation during COVID 19 epidemic. RADIOPHARMACEUTICALS:  4.2 mCi Tc-74mMAA IV COMPARISON:  Chest two views 07/04/2021 and 03/01/2021 FINDINGS: There is homogeneous distribution of radiotracer throughout the bilateral lungs. No major filling defect is seen. IMPRESSION: Normal V/Q scan (  PE absent). Electronically Signed   By: Yvonne Kendall M.D.   On: 07/04/2021 15:12    Procedures Procedures  Patient remains in sinus tachycardia however per chart review this is his baseline.  Medications Ordered in ED Medications - No data to display  ED Course/ Medical Decision Making/ A&P                           Medical Decision Making Amount and/or Complexity of Data Reviewed Labs: ordered. Radiology: ordered.  Risk Prescription drug management.  Patient presents to the ED for concern of SOB. The emergent differential diagnosis for shortness of breath includes, but is not limited to, Pulmonary edema, bronchoconstriction, Pneumonia, Pulmonary embolism, Pneumotherax/ Hemothorax, Dysrythmia, ACS.  All of these were considered throughout the evaluation of the patient.  On initial eval, patient denied any pain or shortness of breath.  Reports he does not feel the symptoms at rest.  Says that his left leg has been more swollen and bothersome than his right.   Co morbidities: Type 2 diabetes, hypertension, obesity and chronic kidney disease.  Additional history obtained from internal and external records: I was able to review patient's  outpatient PCP visits and see that he is nearly always tachycardic to the 100s.   Workup:  Lab Tests:  I Ordered, and personally interpreted labs.   The pertinent results include:   -D-dimer 0.52 -Creatinine 1.66 with GFR 51 which appears to be around patient's baseline although he did not report any kidney disease. -Negative troponin x2, negative BNP  2. Imaging Studies ordered:  I ordered and individually interpreted patient's chest x-ray.  She is well-appearing this revealed no findings responsible for patient's symptoms.  Due to elevated D-dimer, and patient's reported contrast allergy, VQ scan was ordered by me.  I individually viewed this and saw no signs of PE.  Radiologist says perfusion is normal.  3. Cardiac Monitoring:  The patient was maintained on a cardiac monitor.  I personally viewed and interpreted the cardiac monitored which showed sinus tachycardia  Treatment:  Medications:  Patient denies any pain or shortness of breath.  He was noted to be hypertensive so he was given his home dose of amlodipine and lisinopril.    Dispo:  Problem List / ED Course:  Nonspecific chest pain, shortness of breath, worse on exertion   Dispostion:  After the interventions noted above, I reevaluated the patient and found that they continued to be tachycardic however I am less concerned due to this being his baseline and outpatient offices.  Denies any current chest pain or shortness of breath.  At this time, I believe patient is stable for follow-up with his primary care provider.  It is possible that he may need to see a cardiologist on the line however PCP is a good place to start due to his negative cardiac work-up.  Heart score 2.  He is agreeable to discharge and will return with worsening symptoms.   Final Clinical Impression(s) / ED Diagnoses Final diagnoses:  Shortness of breath  Chest pain, unspecified type    Rx / DC Orders Results and diagnoses were explained  to the patient. Return precautions discussed in full. Patient had no additional questions and expressed complete understanding.   This chart was dictated using voice recognition software.  Despite best efforts to proofread,  errors can occur which can change the documentation meaning.    Rhae Hammock, PA-C 07/04/21 1527  Blanchie Dessert, MD 07/04/21 [phone]

## 2021-07-04 NOTE — ED Notes (Signed)
Pt transported to nuclear medicine for VQ scan.

## 2021-07-04 NOTE — Progress Notes (Signed)
° ° °  SUBJECTIVE:   CHIEF COMPLAINT: dyspnea and intermittent chest pain  HPI:   Marcus [name] is a 46 y.o.  with history notable for hypertension, type 2 diabetes, and morbid obesity presenting for leg swelling and dyspnea.  Patient reports 1 weeks of dyspnea, worse on exertion, improved by rest. Will intermittently have right sided chest pain with exertion, resolves with rest, last <5 minutes. Endorses Left greater than right lower extremity. Uncertain if weight gain. Has had mild congestion and cough but no fevers. No sick contacts. No family or personal history of VTE. There is a family history of Factor V Leiden and prothrombin gene mutation. No known heart failure. Has been taking medications except for insulin, BG in 100-200s.    His breathing has been so bad he called EMS on Saturday night they recommended evaluation emergency room.  He declined to go.  He reports he uses 3 pillows at night.  He reports he can sleep flat without waking up.  He denies paroxysmal nocturnal dyspnea.  PERTINENT  PMH / PSH/Family/Social History : family history of factor V leiden/prothrombin gene mutation (per chart--patient not sure if VTE disease or just genetic testing)   OBJECTIVE:   BP (!) 144/90    Pulse (!) 118    Wt 265 lb (120.2 kg)    SpO2 99%    BMI 41.50 kg/m   Today's weight:  Last Weight  Most recent update: 07/04/2021  8:39 AM    Weight  120.2 kg (265 lb)            Review of prior weights: [employer]   07/04/21 0839  Weight: 265 lb (120.2 kg)   Pleasant gentleman, notably tachypneic during examination he is in no distress HEENT exam is notable for tympanic membrane's that are clear.  He does have some cerumen in his bilateral external auditory canals.  Oropharynx without lesions or erythema of the posterior pharynx.  No adenopathy.  Cardiac exam tachycardic to the 120s on my exam.  Lungs with diminished breath sounds particular in the right lung base no egophony.  Abdomen is  mildly distended.  He has 2+ pitting edema bilaterally left greater than right cyst to the knee.  Weight trajectory is noted he is gain just a few pounds since the fall.  EKG that was obtained by EMS shows sinus tachycardia with poor R wave progression EKG in clinic not able to be interpreted due to machine--transferred to ER where EKG should be obtained   ASSESSMENT/PLAN:  Dyspnea and Chest Pain given significant risk factors concern for venous thromboembolic disease or new onset heart failure, also concern for unstable angina.  Less likely but also considered his hyperglycemia as this could cause tachycardia and tachypnea--though no GI symptoms and BG in 100-200s.  Less likely but possible courses significant anemia although he denies any bleeding per rectum or otherwise.  He is additionally not on any blood thinners.  As my high suspicion is for pulmonary embolism and heart failure aspirin was not given in clinic.  He does take an aspirin every day, 81 mg. Given these findings and his risk factors and concern for above listed etiologies transferred to emergency department by nursing staff.  Charge nurse Maralyn Sago, RN called.      Terisa Starr, MD  Family Medicine Teaching Service  [facility]

## 2021-07-04 NOTE — ED Triage Notes (Signed)
Pt sent to ed by pcp. Pt states he has been feeling sob for the past week. States it worsens with exertion. Reports some indigestion as well.

## 2021-07-04 NOTE — ED Notes (Signed)
Per Radiology, meds for scan should arrive around 1330 and they will transport patient around that time. Patient made aware.

## 2021-07-04 NOTE — Patient Instructions (Addendum)
I recommend evaluation in the Emergency Department  I am concerned about a possible blood clot in your lungs or heart condition like heart failure   Please schedule follow up with your PCP for diabetes in 2-4 weeks   Terisa Starr, MD  [facility] Medicine Teaching Service

## 2021-07-04 NOTE — Discharge Instructions (Addendum)
Your lab work looks good today.  As we discussed, it appears that you have some degree of chronic kidney disease.  This is something he should discuss with your primary care provider.  Please follow-up with your primary care provider within the week about your symptoms.  Return to the emergency department with any worsening shortness of breath, chest pain, dizziness or episodes of passing out.

## 2021-07-05 ENCOUNTER — Telehealth: Payer: Self-pay | Admitting: *Deleted

## 2021-07-05 NOTE — Telephone Encounter (Signed)
I would not recommend any medication changes for now. I see he has follow-up with me on 3/13. Please offer sooner appointment to address these concerns if he would like.

## 2021-07-05 NOTE — Telephone Encounter (Signed)
Wife called and states that patient has been experiencing fatigue, bilateral foot and leg swelling, chest pain and decrease in appetite.   Patient was seen in the ED last night and those tests came back normal.  She said this change started after he started trulicity and humolog.  The ED doc suggested they reach out to the PCP regarding these changes and what they should do.  Will forward to MD.  Burnard Hawthorne

## 2021-07-07 ENCOUNTER — Other Ambulatory Visit: Payer: Self-pay

## 2021-07-07 ENCOUNTER — Encounter: Payer: Self-pay | Admitting: Family Medicine

## 2021-07-07 ENCOUNTER — Observation Stay (HOSPITAL_COMMUNITY)
Admission: AD | Admit: 2021-07-07 | Discharge: 2021-07-08 | DRG: 683 | Disposition: A | Discharge status: Home / Self Care | Payer: Medicaid Other | Source: Ambulatory Visit | Attending: Family Medicine | Admitting: Family Medicine

## 2021-07-07 ENCOUNTER — Encounter: Payer: Self-pay | Admitting: Pharmacist

## 2021-07-07 ENCOUNTER — Ambulatory Visit (INDEPENDENT_AMBULATORY_CARE_PROVIDER_SITE_OTHER): Payer: Medicaid Other | Admitting: Pharmacist

## 2021-07-07 ENCOUNTER — Inpatient Hospital Stay (HOSPITAL_COMMUNITY): Payer: Medicaid Other

## 2021-07-07 DIAGNOSIS — Z79899 Other long term (current) drug therapy: Secondary | ICD-10-CM

## 2021-07-07 DIAGNOSIS — Z7984 Long term (current) use of oral hypoglycemic drugs: Secondary | ICD-10-CM | POA: Diagnosis not present

## 2021-07-07 DIAGNOSIS — N179 Acute kidney failure, unspecified: Secondary | ICD-10-CM | POA: Diagnosis present

## 2021-07-07 DIAGNOSIS — E1122 Type 2 diabetes mellitus with diabetic chronic kidney disease: Secondary | ICD-10-CM | POA: Diagnosis present

## 2021-07-07 DIAGNOSIS — F319 Bipolar disorder, unspecified: Secondary | ICD-10-CM | POA: Diagnosis not present

## 2021-07-07 DIAGNOSIS — Z6841 Body Mass Index (BMI) 40.0 and over, adult: Secondary | ICD-10-CM

## 2021-07-07 DIAGNOSIS — R531 Weakness: Secondary | ICD-10-CM | POA: Diagnosis not present

## 2021-07-07 DIAGNOSIS — Z9114 Patient's other noncompliance with medication regimen: Secondary | ICD-10-CM | POA: Diagnosis not present

## 2021-07-07 DIAGNOSIS — N183 Chronic kidney disease, stage 3 unspecified: Secondary | ICD-10-CM | POA: Diagnosis present

## 2021-07-07 DIAGNOSIS — E1165 Type 2 diabetes mellitus with hyperglycemia: Secondary | ICD-10-CM | POA: Diagnosis present

## 2021-07-07 DIAGNOSIS — I129 Hypertensive chronic kidney disease with stage 1 through stage 4 chronic kidney disease, or unspecified chronic kidney disease: Secondary | ICD-10-CM | POA: Diagnosis present

## 2021-07-07 DIAGNOSIS — R5383 Other fatigue: Secondary | ICD-10-CM | POA: Diagnosis not present

## 2021-07-07 DIAGNOSIS — Z832 Family history of diseases of the blood and blood-forming organs and certain disorders involving the immune mechanism: Secondary | ICD-10-CM | POA: Diagnosis not present

## 2021-07-07 DIAGNOSIS — Z794 Long term (current) use of insulin: Secondary | ICD-10-CM | POA: Diagnosis not present

## 2021-07-07 DIAGNOSIS — Z91041 Radiographic dye allergy status: Secondary | ICD-10-CM

## 2021-07-07 DIAGNOSIS — I959 Hypotension, unspecified: Secondary | ICD-10-CM | POA: Diagnosis present

## 2021-07-07 DIAGNOSIS — E785 Hyperlipidemia, unspecified: Secondary | ICD-10-CM | POA: Diagnosis present

## 2021-07-07 DIAGNOSIS — E860 Dehydration: Secondary | ICD-10-CM | POA: Diagnosis present

## 2021-07-07 DIAGNOSIS — E119 Type 2 diabetes mellitus without complications: Secondary | ICD-10-CM

## 2021-07-07 DIAGNOSIS — E118 Type 2 diabetes mellitus with unspecified complications: Secondary | ICD-10-CM | POA: Diagnosis not present

## 2021-07-07 DIAGNOSIS — Z20822 Contact with and (suspected) exposure to covid-19: Secondary | ICD-10-CM | POA: Diagnosis present

## 2021-07-07 DIAGNOSIS — R4182 Altered mental status, unspecified: Secondary | ICD-10-CM | POA: Diagnosis present

## 2021-07-07 DIAGNOSIS — Z885 Allergy status to narcotic agent status: Secondary | ICD-10-CM | POA: Diagnosis not present

## 2021-07-07 DIAGNOSIS — Z91013 Allergy to seafood: Secondary | ICD-10-CM

## 2021-07-07 LAB — CBC
HCT: 39 % (ref 39.0–52.0)
Hemoglobin: 12.4 g/dL — ABNORMAL LOW (ref 13.0–17.0)
MCH: 29 pg (ref 26.0–34.0)
MCHC: 31.8 g/dL (ref 30.0–36.0)
MCV: 91.3 fL (ref 80.0–100.0)
Platelets: 333 10*3/uL (ref 150–400)
RBC: 4.27 MIL/uL (ref 4.22–5.81)
RDW: 13.2 % (ref 11.5–15.5)
WBC: 6.6 10*3/uL (ref 4.0–10.5)
nRBC: 0 % (ref 0.0–0.2)

## 2021-07-07 LAB — COMPREHENSIVE METABOLIC PANEL
ALT: 44 U/L (ref 0–44)
AST: 29 U/L (ref 15–41)
Albumin: 3.6 g/dL (ref 3.5–5.0)
Alkaline Phosphatase: 52 U/L (ref 38–126)
Anion gap: 14 (ref 5–15)
BUN: 19 mg/dL (ref 6–20)
CO2: 17 mmol/L — ABNORMAL LOW (ref 22–32)
Calcium: 9.6 mg/dL (ref 8.9–10.3)
Chloride: 102 mmol/L (ref 98–111)
Creatinine, Ser: 1.74 mg/dL — ABNORMAL HIGH (ref 0.61–1.24)
GFR, Estimated: 48 mL/min — ABNORMAL LOW (ref >60)
Glucose, Bld: 194 mg/dL — ABNORMAL HIGH (ref 70–99)
Potassium: 4.1 mmol/L (ref 3.5–5.1)
Sodium: 133 mmol/L — ABNORMAL LOW (ref 135–145)
Total Bilirubin: 0.5 mg/dL (ref 0.3–1.2)
Total Protein: 7.5 g/dL (ref 6.5–8.1)

## 2021-07-07 LAB — BLOOD GAS, ARTERIAL
Acid-Base Excess: 0.1 mmol/L (ref 0.0–2.0)
Bicarbonate: 24.7 mmol/L (ref 20.0–28.0)
FIO2: 21 %
O2 Saturation: 94.7 %
Patient temperature: 37
pCO2 arterial: 39 mmHg (ref 32–48)
pH, Arterial: 7.41 (ref 7.35–7.45)
pO2, Arterial: 66 mmHg — ABNORMAL LOW (ref 83–108)

## 2021-07-07 LAB — GLUCOSE, CAPILLARY
Glucose-Capillary: 194 mg/dL — ABNORMAL HIGH (ref 70–99)
Glucose-Capillary: 172 mg/dL — ABNORMAL HIGH (ref 70–99)

## 2021-07-07 MED ORDER — INSULIN ASPART 100 UNIT/ML IJ SOLN
0.0000 [IU] | Freq: Three times a day (TID) | INTRAMUSCULAR | Status: DC
  Administered 2021-07-08: 3 [IU] via SUBCUTANEOUS
  Administered 2021-07-08: 2 [IU] via SUBCUTANEOUS

## 2021-07-07 MED ORDER — SODIUM CHLORIDE 0.9 % IV BOLUS
500.0000 mL | Freq: Once | INTRAVENOUS | Status: AC
  Administered 2021-07-07: 500 mL via INTRAVENOUS

## 2021-07-07 NOTE — Progress Notes (Signed)
Patient arrived from home. Family medicine team notified of patient arrival and need of admitting orders

## 2021-07-07 NOTE — Progress Notes (Signed)
FPTS Interim Progress Note  Went to bedside to ensure patient stable, wife present at bedside. Patient not in the room as he was getting his CXR. Discussed with wife the blood work and testing primary team is to complete to work to investigate these ongoing mental status changes and generalized weakness that the patient has been experiencing.I explained to her that night team will be arriving shortly to further assess the patient and do a complete evaluation. Wife voiced understanding. Informed her to please inform primary team if they need anything. Night team admission note to follow.   Donney Dice, DO 07/07/2021, 6:45 PM PGY-2, [facility] pager [phone]

## 2021-07-07 NOTE — Patient Instructions (Signed)
Admitting team for hospital to evaluate.

## 2021-07-07 NOTE — Progress Notes (Signed)
Reviewed: I agree with Dr. Koval's documentation and management. 
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
## 2021-07-07 NOTE — Assessment & Plan Note (Signed)
Spouse concerned that current symptoms are drug-induced side effects since recently starting Trulicity and Humalog. Will discontinue Trulicity (last dose 06/29/21) and evaluate if symptoms improve. Would not suspect these symptoms to be related to Humalog. Continue all other medications.   Given the patient's complaint of recurrent symptoms of swelling, CP, and ongoing fatigue, as well as wife's concern over acute changes seen in the patient, asked Dr. Jennette Kettle to evaluate the patient who recommended he be admitted to [facility] for further evaluation.

## 2021-07-07 NOTE — Progress Notes (Signed)
S:    Chief Complaint  Patient presents with   Medication Management    Diabetes    Marcus [name] is a 46 y.o. adult who presents for diabetes evaluation, education, and management. PMH is significant for T2DM, HLD, CKD, HTN, obesity. Patient was referred and last seen by Primary Care Provider, Dr. Wynelle Link, on 05/17/21. He was recently seen by Dr. Manson Passey 07/04/21 and sent to the ED for symptoms of dyspnea, leg swelling, and chest pain x 1 week. Workup in the ED was negative at that time.   Today, he presents ambulating without assistance accompanied by his wife. His wife expresses concern in the acute decline she has seen in him recently compared to what he was like a month ago. Reports recurrence of chest pain after leaving the ED. Reports he is tired all the time, and sleeps most of the day. Unable to drive. She reports he has a lack of appetite and she has to monitor him throughout the day to make sure he drinks water. Reported weight loss of 5 lbs in the last month. They think these symptoms are related to the Trulicity and Humalog that he started about 2 weeks ago since symptoms started about a week later. Last dose of Trulicity was last Wednesday (06/29/21).   Current diabetes medications include: Jardiance (empagliflozin) 25mg , Humalog (insulin lispro) 16 units before dinner, metformin 1000mg  BID, Trulicity (dulaglutide) 0.75mg  weekly  Current hypertension medications include: amlodipine 10mg , chlorthalidone 25mg , lisinopril 40mg  Current hyperlipidemia medications include: rosuvastatin 40mg , ezetimibe 10mg   Patient reported dietary habits: Low appetite for food and water lately  O:  Physical Exam Vitals reviewed.  Musculoskeletal:        General: No swelling.  Neurological:     Mental Status: He is alert.   Review of Systems  Constitutional:  Positive for weight loss.  Neurological:        Fatigue   Lab Results  Component Value Date   HGBA1C 12.6 (A) 05/17/2021   Vitals:    07/07/21 0902  BP: 126/88  Pulse: (!) 102  SpO2: 98%    Lipid Panel     Component Value Date/Time   CHOL 260 (H) 10/26/2020 1028   TRIG 418 (H) 10/26/2020 1028   HDL 28 (L) 10/26/2020 1028   CHOLHDL 9.3 (H) 10/26/2020 1028   CHOLHDL 7.6 (H) 06/25/2015 1050   VLDL 37 (H) 06/25/2015 1050   LDLCALC 153 (H) 10/26/2020 1028   LDLDIRECT 131 (H) 05/17/2021 1606    Clinical Atherosclerotic Cardiovascular Disease (ASCVD): No  The 10-year ASCVD risk score (Arnett DK, et al., 2019) is: 16.4%   Values used to calculate the score:     Age: 60 years     Sex: Male     Is Non-Hispanic African American: Yes     Diabetic: Yes     Tobacco smoker: No     Systolic Blood Pressure: 126 mmHg     Is BP treated: Yes     HDL Cholesterol: 28 mg/dL     Total Cholesterol: 260 mg/dL    A/P: Given the patient's complaint of recurrent symptoms of swelling, CP, and ongoing fatigue, as well as wife's concern over acute changes seen in the patient, asked Dr. 10/28/2020 to evaluate the patient who recommended he be admitted to [facility] for further evaluation.   Spouse concerned that current symptoms are drug-induced side effects since recently starting Trulicity and Humalog. Will discontinue Trulicity (last dose 06/29/21) and evaluate if  symptoms improve. Would not suspect these symptoms to be related to Humalog. Continue all other medications.   Total time in face to face counseling 35 minutes.
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
## 2021-07-07 NOTE — Progress Notes (Signed)
Attempted to obtain ABG but the patient was leaving with transport to Radiology. Will notify oncoming RT.

## 2021-07-07 NOTE — Progress Notes (Unsigned)
CHIEF COMPLAINT / HPI:  Patient seen at the request of Dr. Madelon Lips.  Patient was scheduled in the pharmacy clinic with Dr. [name] this morning for further evaluation and management of his diabetes mellitus.  Dr. [name] has seen this patient several times before.  As preceptor for the morning, I was contacted by Dr. [name] to come evaluate the patient as he has had significant changes in his mentation, energy level, chest pains in the last 2 to 3 weeks.  He has previously also been seen in the emergency department for work-up and evaluation of a swollen leg.  Much of the history is obtained from his wife in his presence.  She reports that last 3 to 4 weeks he has become extremely somnolent, will sleep 24 hours a day if she lets him.  He is not eating and drinking like he typically would.  She states these changes back to when they started the Trulicity.  At baseline, he takes care of of many of the household chores and errands, drives, including driving to [place] to see his mother.  Right now the family is so concerned about his confusion and somnolence that they will not let him drive at all.  She also says that he has been complaining intermittently of chest pains for the last 3 to 4 weeks.  He had developed a swollen leg and was seen at the emergency department where he had negative lower extremity ultrasound and a negative VQ scan.  Additional history also obtained from Dr. [name]: Familiar with the patient from multiple previous encounters.  At baseline he reports Mr. Hanner has some slight cognitive delay but typically is much more interactive than he is today.  He reports that there is definitely a change since he saw him last time.  PERTINENT  PMH / PSH: I have reviewed the patients medications, allergies, past medical and surgical history, smoking status and updated in the EMR as appropriate. Family history of factor V Leiden mutation and with prothrombin G20210 a mutation, both in his  sister.  I do not see from his chart that the patient has ever been tested for either mutation nor has he had any evaluation for coagulopathy.   OBJECTIVE:  There were no vitals taken for this visit. GENERAL: Well-developed male no acute distress. NEURO: Answers questions fairly slowly.  Answers them appropriately.  He follows commands.  He stands steady with arms outstretched and eyes closed.  His gait is slightly wide-based.  He can rise from a chair without assistance.  ASSESSMENT / PLAN: #1.  Mental status changes 2.  Somnolence 3.  Chest pain  #1.  Mental status changes: Decreased alertness and responsiveness per family report.Temporally, there is some relation to starting the new medication Trulicity so we will discontinue that.  It is unlikely that that is the sole cause of this fairly significant change in his ability to drive, ability to stay awake during the day and to present differently to a provider who has seen him multiple times before.  Differential is extremely broad.  I definitely would consider an ABG.  Likely needs CT head.  With family history of coagulopathy, it would be beneficial to know if he is also at risk.  While the VQ scan was negative as was the ultrasound, this does not totally rule out that he is having embolic events.  Decisions about further work up  we will  depend on CT scan results but might also consider MRI of  the brain.  #2.  Somnolence: Sounds like this is definitely a big change.  It looks like he was initially set up for a sleep study in June 2022 but I do not think that actually occurred.  Pending his ABG, would consider further evaluation and management.  Additionally, review of all of his medications in conjunction with the evaluation with general blood work.  #3.  Given his intermittent chest pains over the last 3 to 4 weeks, will get EKG etc. on admission.  Vague complaints of chest discomfort like this have extremely broad differential including  GERD, cardiac causes etc.  He has a very complex presentation with broad differentials.  I do think he needs hospitalization and evaluation for these acute changes in his mental status, confusion, somnolence.  Given his family history of severe coagulopathy and the fact that he is never actually had the work-up, would also consider echocardiogram.  He has had chronic tachycardia per his outpatient chart.  We would like to avoid utilization of the emergency department for admission as it would be simpler just to directly admit him to our inpatient service.  There will be a fairly long wait for bed but I think we can get him in today.  I have discussed this with him and his wife and with our admitting provider, Dr. Allena Katz, who will see him in the next hour and complete the history and physical as well as orders.  No problem-specific Assessment & Plan notes found for this encounter.   Denny Levy MD

## 2021-07-08 ENCOUNTER — Inpatient Hospital Stay (HOSPITAL_COMMUNITY): Payer: Medicaid Other

## 2021-07-08 ENCOUNTER — Encounter (HOSPITAL_COMMUNITY): Payer: Self-pay | Admitting: Family Medicine

## 2021-07-08 DIAGNOSIS — N179 Acute kidney failure, unspecified: Secondary | ICD-10-CM | POA: Diagnosis not present

## 2021-07-08 DIAGNOSIS — R531 Weakness: Secondary | ICD-10-CM | POA: Diagnosis not present

## 2021-07-08 LAB — GLUCOSE, CAPILLARY
Glucose-Capillary: 160 mg/dL — ABNORMAL HIGH (ref 70–99)
Glucose-Capillary: 167 mg/dL — ABNORMAL HIGH (ref 70–99)
Glucose-Capillary: 203 mg/dL — ABNORMAL HIGH (ref 70–99)

## 2021-07-08 LAB — BASIC METABOLIC PANEL
Anion gap: 12 (ref 5–15)
BUN: 18 mg/dL (ref 6–20)
CO2: 21 mmol/L — ABNORMAL LOW (ref 22–32)
Calcium: 9.4 mg/dL (ref 8.9–10.3)
Chloride: 101 mmol/L (ref 98–111)
Creatinine, Ser: 1.48 mg/dL — ABNORMAL HIGH (ref 0.61–1.24)
GFR, Estimated: 59 mL/min — ABNORMAL LOW (ref >60)
Glucose, Bld: 152 mg/dL — ABNORMAL HIGH (ref 70–99)
Potassium: 4 mmol/L (ref 3.5–5.1)
Sodium: 134 mmol/L — ABNORMAL LOW (ref 135–145)

## 2021-07-08 LAB — CBC
HCT: 36.3 % — ABNORMAL LOW (ref 39.0–52.0)
Hemoglobin: 12 g/dL — ABNORMAL LOW (ref 13.0–17.0)
MCH: 30 pg (ref 26.0–34.0)
MCHC: 33.1 g/dL (ref 30.0–36.0)
MCV: 90.8 fL (ref 80.0–100.0)
Platelets: 329 10*3/uL (ref 150–400)
RBC: 4 MIL/uL — ABNORMAL LOW (ref 4.22–5.81)
RDW: 13.2 % (ref 11.5–15.5)
WBC: 7.1 10*3/uL (ref 4.0–10.5)
nRBC: 0 % (ref 0.0–0.2)

## 2021-07-08 LAB — TSH: TSH: 2.188 u[IU]/mL (ref 0.350–4.500)

## 2021-07-08 LAB — OSMOLALITY: Osmolality: 291 mOsm/kg (ref 275–295)

## 2021-07-08 MED ORDER — LISINOPRIL 10 MG PO TABS: 10.0000 mg | ORAL_TABLET | Freq: Every day | ORAL | 2 refills | Status: DC

## 2021-07-08 MED ORDER — ENOXAPARIN SODIUM 40 MG/0.4ML IJ SOSY
40.0000 mg | PREFILLED_SYRINGE | INTRAMUSCULAR | Status: DC
  Administered 2021-07-08: 40 mg via SUBCUTANEOUS
  Filled 2021-07-08: qty 0.4

## 2021-07-08 MED ORDER — EMPAGLIFLOZIN 25 MG PO TABS: 25.0000 mg | ORAL_TABLET | Freq: Every day | ORAL | 3 refills | Status: DC

## 2021-07-08 MED ORDER — LIRAGLUTIDE 18 MG/3ML ~~LOC~~ SOPN: 0.6000 mg | PEN_INJECTOR | Freq: Every day | SUBCUTANEOUS | 2 refills | Status: DC

## 2021-07-08 MED ORDER — EZETIMIBE 10 MG PO TABS
10.0000 mg | ORAL_TABLET | Freq: Every day | ORAL | Status: DC
  Administered 2021-07-08: 10 mg via ORAL
  Filled 2021-07-08: qty 1

## 2021-07-08 MED ORDER — ROSUVASTATIN CALCIUM 20 MG PO TABS
40.0000 mg | ORAL_TABLET | Freq: Every day | ORAL | Status: DC
  Administered 2021-07-08: 40 mg via ORAL
  Filled 2021-07-08: qty 2

## 2021-07-08 MED ORDER — LIVING WELL WITH DIABETES BOOK
Freq: Once | Status: AC
  Filled 2021-07-08: qty 1

## 2021-07-08 MED ORDER — ENSURE ENLIVE PO LIQD: 237.0000 mL | Freq: Two times a day (BID) | ORAL | Status: DC

## 2021-07-08 MED ORDER — METFORMIN HCL 1000 MG PO TABS: 1000.0000 mg | ORAL_TABLET | Freq: Two times a day (BID) | ORAL | 3 refills | Status: DC

## 2021-07-08 NOTE — Hospital Course (Addendum)
Marcus [name] is a 46 y.o. adult with a history of T2DM, HLD, CKD, HTN, obesity, bipolar disorder, mild cognitive delay who presented with generalized weakness and fatigue. Hospital course outlined by problem below:  Altered mental status   Generalized weakness Patient presented with decline in mental status and fatigue over the past 2 weeks and was sent to ED by primary doctor's office. Had demonstrated progressive decline over the past few weeks with decreased ability to focus, decreased appetite and 5 pound weight loss. On admission, Hgb 12.4, Na 133, K 4.1 and CBG 194. CXR demonstrated no active cardiopulmonary disease. CT head w/o contrast was negative. TSH wnl. ABG normal as well. Patient demonstrated improvement during his hospital stay and returned back to baseline with minimal residual fatigue prior to discharge. Etiology unclear but seems to be unrelated to any acute findings. Patient would likely benefit from sleep study in the outpatient setting to evaluate for sleep apnea which may be contributing to his symptoms.  AKI on CKD stage 3 Cr 1.74 on admission and improved to 1.48 on discharge. Likely secondary to dehydration and poor oral intake.   Hypertension Normotensive on admission with multiple hypotensive episodes. All home medications held on admission. Lisinopril low dose *** restarted on discharge.   Type 2 DM Most recent A1c 12.1. CBG on admission 194. Questionable compliance and adherence to outpatient diabetic regimen. CBGs monitored throughout hospitalization and remained appropriate. Patient discharged on diabetic regimen of *** with close follow up with PCP scheduled.    All other issues chronic and stable.   Issues for follow up Patient will benefit from sleep study outpatient, concerned for OSA.  Recheck BMP at hospital follow up to monitor renal function.  Repeat CBG, diabetic regimen adjusted prior to discharge, adjust regimen as needed.  Recheck BP at follow up,  adjust regimen as appropriate. Amlodipine and HCTZ discontinued at discharge. Uptitrate Victoza if tolerated. Consider restarting Lantus if fasting sugars are low. Consider further workup for family history of factor V Leiden mutation.  Limit nephrotoxic agents given CKD stage 3.

## 2021-07-08 NOTE — Progress Notes (Signed)
Patient ambulated with morning in room with O2 sats 96-97% on room air.  HR fluctuated between 112-128. MD notified.

## 2021-07-08 NOTE — Progress Notes (Signed)
Initial Nutrition Assessment  DOCUMENTATION CODES:   Morbid obesity  INTERVENTION:  - Encourage adequate PO intake - Ensure Enlive po BID, each supplement provides 350 kcal and 20 grams of protein. - "Plate Method" added to AVS  NUTRITION DIAGNOSIS:   Inadequate oral intake related to poor appetite as evidenced by per patient/family report.  GOAL:   Patient will meet greater than or equal to 90% of their needs  MONITOR:   Supplement acceptance, PO intake, Labs, Weight trends  REASON FOR ASSESSMENT:   Malnutrition Screening Tool    ASSESSMENT:   Pt admitted with generalized weakness likely secondary to medication induced hypotension and or hypoglycemia. PMH includes T2DM, HLD, CKD, HTN, obesity and mild cognitive delay, bipolar disorder.  Spoke with pt over the phone. He states that he has eaten minimally over the last several days d/t weakness and poor appetite, endorses eating soup. Prior to this he was eating ~2 meals per day on a variety of foods but did not provide details on types of foods or beverages he consumes. Briefly discussed eating a balanced diet and consistently throughout the day for blood sugar control. He reports taking 2 medications for diabetes, insulin once daily and checking his blood sugar once daily.  He is unsure of his usual body weight but suspects a 3 lb weight loss recently. Within the last year, pt's weight noted to fluctuate between 118.1-120.2 kg. Current weight noted to be 116.6 kg.   Medications: SSI  Labs: sodium 134, Cr 1.48, HgbA1c (11/21) 11.2%,   CBG's 127-203 x 24 hours  NUTRITION - FOCUSED PHYSICAL EXAM: RD working remotely- deferred to follow up  Diet Order:   Diet Order             Diet regular Room service appropriate? Yes; Fluid consistency: Thin  Diet effective now                   EDUCATION NEEDS:   Education needs have been addressed  Skin:  Skin Assessment: Reviewed RN Assessment  Last BM:   2/22  Height:   Ht Readings from Last 1 Encounters:  07/08/21 5\' 4"  (1.626 m)    Weight:   Wt Readings from Last 1 Encounters:  07/07/21 116.6 kg    Ideal Body Weight:  59.1 kg  BMI:  Body mass index is 44.11 kg/m.  Estimated Nutritional Needs:   Kcal:  1900-2000  Protein:  95-110g  Fluid:  >/=1.9L  07/09/21, RDN, LDN Clinical Nutrition

## 2021-07-08 NOTE — Progress Notes (Signed)
°  Transition of Care [facility]) Screening Note   Patient Details  Name: Marcus [name] Date of Birth: 03/05/1976   Transition of Care [facility]) CM/SW Contact:    Baldemar Lenis, LCSW Phone Number: 07/08/2021, 2:19 PM    Transition of Care Department [facility]) has reviewed patient and no TOC needs have been identified at this time. We will continue to monitor patient advancement through interdisciplinary progression rounds. If new patient transition needs arise, please place a TOC consult.

## 2021-07-08 NOTE — Discharge Instructions (Addendum)
Dear Marcus [name],   Thank you so much for allowing [place] to be part of your care!  You were admitted to [facility] for weakness and changes in mental status. We think it could be related to your medications or sleep apnea. Would suggest a sleep study outpatient and to take medications as instructed. Make sure to check your blood sugar at least once a day (at least when fasting in the morning) and write them down.   POST-HOSPITAL & CARE INSTRUCTIONS Please follow up with PCP Please let PCP/Specialists know of any changes that were made.  Please see medications section of this packet for any medication changes.   DOCTOR'S APPOINTMENT & FOLLOW UP CARE INSTRUCTIONS  Future Appointments  Date Time Provider [place]  07/20/2021  9:15 AM Gardiner Barefoot, DPM [facility]  07/25/2021 11:10 AM Zola Button, MD [facility]    RETURN PRECAUTIONS:  Please return if you have worsening confusion, chest pain, shortness of breath, lower leg swelling.   Take care and be well!  [facility]  [place], [place] [address] [phone]     Plate Method for Diabetes   Foods with carbohydrates make your blood glucose level go up. The plate method is a simple way to meal plan and control the amount of carbohydrate you eat.         Use the following guidance to build a healthy plate to control carbohydrates. Divide a 9-inch plate into 3 sections, and consider your beverage the 4th section of your meal: Food Group Examples of Foods/Beverages for This Section of your Meal  Section 1: Non-starchy vegetables Fill  of your plate to include non-starchy vegetables Asparagus, broccoli, brussels sprouts, cabbage, carrots, cauliflower, celery, cucumber, green beans, mushrooms, peppers, salad greens, tomatoes, or zucchini.  Section 2: Protein foods Fill  of your plate to include a lean protein Lean meat,  poultry, fish, seafood, cheese, eggs, lean deli meat, tofu, beans, lentils, nuts or nut butters.  Section 3: Carbohydrate foods Fill  of your plate to include carbohydrate foods Whole grains, whole wheat bread, brown rice, whole grain pasta, polenta, corn tortillas, fruit, or starchy vegetables (potatoes, green peas, corn, beans, acorn squash, and butternut squash). One cup of milk also counts as a food that contains carbohydrate.  Section 4: Beverage Choose water or a low-calorie drink for your beverage. Unsweetened tea, coffee, or flavored/sparkling water without added sugar.  Image reprinted with permission from The American Diabetes Association.  Copyright 2022 by the American Diabetes Association.   Copyright 2022  Academy of Nutrition and Dietetics. All rights reserved

## 2021-07-08 NOTE — Progress Notes (Signed)
I met with patient and his wife Cartina at bedside.  He feels he is back to his baseline.  I suspect his presentation is largely due to a combination of medication induced hypoglycemia and/or hypotension.  There has been much confusion about his medications and he has also been taking short acting insulin incorrectly, a few hours before his evening meal.  Wife believes he may have had an adverse reaction to the Trulicity and they are hesitant to restart this medication.  He previously did well on Victoza.  I discussed medication plan with them which is reflected in the discharge orders.  We will restart lower dose lisinopril, hold amlodipine and HCTZ.  Restart Victoza, continue metformin and empagliflozin.  We will hold off on insulin for now.  Advised him to check his CBGs fasting daily.  I have scheduled him follow-up in 1 week.  Consider restarting Lantus depending on fasting sugars.  Also counseled to avoid NSAIDs due to CKD.

## 2021-07-08 NOTE — Progress Notes (Addendum)
Family Medicine Teaching Service Daily Progress Note Intern Pager: [phone]  Patient name: Marcus [name] Medical record number: [id number] Date of birth: 1975/09/04 Age: 46 y.o. Gender: adult  Primary Care Provider: Zola Button, MD Consultants: None  Code Status: FULL   Pt Overview and Major Events to Date:  07/07/21: Admitted to FPTS   Assessment and Plan:  Marcus [name] is a 46 y.o. adult presenting with generalize decline. PMH is significant for T2DM, HLD, CKD, HTN, obesity, mild cognitive delay, bipolar disorder.   Mental Status Changes   Generalized Decline with unclear etiology  Today, the patient appears to be greatly improved, all labs have been fairly unremarkable. CBGs 160>167, Na corrected to 136, TSH nml, Hgb 12, ABG nml, CXR nml. It appears that the patient has been hypotensive overnight, although he has not been restarted on blood pressure regimen. Regimen is as follows: Amlodipine 10 mg, Lisinopril 40 mg, Chlorthalidone 25 mg. Suspicious that the changes he has been noticing are related to medication induced hypotension and/or hypoglycemia (Trulicity was started prior to the changes started). Most recent A1C 12.6 in January 2023. It appears that he has been possibly taking medications incorrectly, especially his home insulin.  Reassuringly, there does not seem to be an infectious cause or cardiac etiology of the symptoms and labwork is all fairly unremarkable. CT head negative. Will further evaluate other possibilities, neurologic exam remains unremarkable today. Needs outpatient sleep study and ambulation with pulse ox.  -Continue to hold HTN regimen  -Trulicity has been discontinued  -Monitor VS  -Consider echo if indicated  -Amb with pulse ox  -Sleep study outpatient   Intermittent Chest Pain  No noted chest pain today. No troponin's obtained overnight, but EKG reassuring with sinus tachycardia. Will consider troponins if symptomatic.  -Continue to monitor    Uncontrolled T2DM  A1C as above 12.3. CBGs 172>160>167. Diabetes educator to see.  -sSSI TID with meals  -Monitor CBGs four times daily    HTN  See above. Normotensive. Home regimen: Amlodipine 10 mg, Lisinopril 40 mg, Chlorthalidone 25 mg. -Holding home medications  -Monitor VS   HLD  Continue home Rosuvastatin and Zetia   AKI on CKD stage III, improving  Steadily increasing creatinine, improving today to 1.48. Fluid resuscitation as indicated. Hold home Lisinopril.   Bipolar Disorder  Paliperidone once monthly   Family H/o Factor V Leiden Mutation  -Consider coagulopathy work up if indicated    FEN/GI: Heart Healthy  PPx: Lovenox  Dispo: Pending medical work up   Subjective:  Patient reports that he is feeling much better this AM and wants to go home.   Objective: Temp:  [97.8 F (36.6 C)-98.7 F (37.1 C)] 98.1 F (36.7 C) (02/24 1111) Pulse Rate:  [92-110] 92 (02/24 1111) Resp:  [14-19] 16 (02/24 1111) BP: (89-129)/(66-102) 127/85 (02/24 1111) SpO2:  [96 %-98 %] 97 % (02/24 1111) General: Alert and oriented in no apparent distress Heart: Regular rate and rhythm with no murmurs appreciated Lungs: CTA bilaterally, no wheezing Abdomen: Bowel sounds present, no abdominal pain Skin: Warm and dry Extremities: No lower extremity edema Neuro: CN II-XII intact without focal neurologic deficits   Laboratory: Recent Labs  Lab 07/04/21 0937 07/07/21 1907 07/08/21 0258  WBC 5.0 6.6 7.1  HGB 12.5* 12.4* 12.0*  HCT 40.2 39.0 36.3*  PLT 343 333 329   Recent Labs  Lab 07/04/21 0937 07/07/21 1907 07/08/21 0258  NA 135 133* 134*  K 5.0 4.1 4.0  CL 103 102  101  CO2 19* 17* 21*  BUN 17 19 18   CREATININE 1.66* 1.74* 1.48*  CALCIUM 9.4 9.6 9.4  PROT  --  7.5  --   BILITOT  --  0.5  --   ALKPHOS  --  52  --   ALT  --  44  --   AST  --  29  --   GLUCOSE 199* 194* 152*   DG Chest 2 View  Result Date: 07/07/2021 CLINICAL DATA:  Generalized weakness EXAM:  CHEST - 2 VIEW COMPARISON:  07/04/2021 FINDINGS: The heart size and mediastinal contours are within normal limits. Both lungs are clear. The visualized skeletal structures are unremarkable. IMPRESSION: No active cardiopulmonary disease. Electronically Signed   By: Elmer Picker M.D.   On: 07/07/2021 19:22   CT HEAD WO CONTRAST (5MM)  Result Date: 07/08/2021 CLINICAL DATA:  Mental status post change, persistent or worsening. Decline in appetite and rapid onset weight loss EXAM: CT HEAD WITHOUT CONTRAST TECHNIQUE: Contiguous axial images were obtained from the base of the skull through the vertex without intravenous contrast. RADIATION DOSE REDUCTION: This exam was performed according to the departmental dose-optimization program which includes automated exposure control, adjustment of the mA and/or kV according to patient size and/or use of iterative reconstruction technique. COMPARISON:  03/26/2020 head CTA FINDINGS: Brain: No evidence of acute infarction, hemorrhage, hydrocephalus, extra-axial collection or mass lesion/mass effect. Vascular: No hyperdense vessel or unexpected calcification. Skull: Normal. Negative for fracture or focal lesion. Sinuses/Orbits: No acute finding. IMPRESSION: Negative head CT. Electronically Signed   By: Jorje Guild M.D.   On: 07/08/2021 07:28     Erskine Emery, MD 07/08/2021, 11:30 AM PGY-1, [place] Intern pager: [phone], text pages welcome

## 2021-07-08 NOTE — Discharge Summary (Signed)
[facility] Discharge Summary  Patient name: Marcus [name] Medical record number: [id number] Date of birth: [birth year] Age: 46 y.o. Gender: adult Date of Admission: 07/07/2021  Date of Discharge: 07/08/2021 Admitting Physician: Lind Covert, MD  Primary Care Provider: Zola Button, MD Consultants: none  Indication for Hospitalization: generalized weakness, mental status changes  Discharge Diagnoses/Problem List:  Altered mental status   Generalized weakness AKI on CKD stage 3 Poorly controlled T2DM Hypertension Bipolar disorder Family history of Factor V Leiden Mutation   Disposition: home  Discharge Condition: medically stable  Discharge Exam:   Temp:  [97.8 F (36.6 C)-98.7 F (37.1 C)] 98.1 F (36.7 C) (02/24 1111) Pulse Rate:  [92-110] 92 (02/24 1111) Resp:  [14-19] 16 (02/24 1111) BP: (89-129)/(66-102) 127/85 (02/24 1111) SpO2:  [96 %-98 %] 97 % (02/24 1111) General: Alert and oriented in no apparent distress Heart: Regular rate and rhythm with no murmurs appreciated Lungs: CTA bilaterally, no wheezing Abdomen: Bowel sounds present, no abdominal pain Skin: Warm and dry Extremities: No lower extremity edema Neuro: CN II-XII intact without focal neurologic deficits   Physical exam performed by Dr. Zigmund Daniel  Brief [facility] Hospital Course:  Marcus [name] is a 46 y.o. adult with a history of T2DM, HLD, CKD, HTN, obesity, bipolar disorder, mild cognitive delay who presented with generalized weakness and fatigue. Hospital course outlined by problem below:  Altered mental status   Generalized weakness Patient presented with decline in mental status and fatigue over the past 2 weeks and was sent to ED by primary doctor's office. Had demonstrated progressive decline over the past few weeks with decreased ability to focus, decreased appetite and 5 pound weight loss. On admission, Hgb 12.4, Na 133, K 4.1 and CBG 194. CXR demonstrated no active  cardiopulmonary disease. CT head w/o contrast was negative. TSH wnl. ABG normal as well. Patient demonstrated improvement during his hospital stay and returned back to baseline with minimal residual fatigue prior to discharge. Etiology unclear but seems to be unrelated to any acute findings. Patient would likely benefit from sleep study in the outpatient setting to evaluate for sleep apnea which may be contributing to his symptoms.  AKI on CKD stage 3 Cr 1.74 on admission and improved to 1.48 on discharge. Likely secondary to dehydration and poor oral intake.   Hypertension Normotensive on admission with multiple hypotensive episodes. All home medications held on admission. Lisinopril 10 mg restarted on discharge.   Type 2 DM Most recent A1c 12.1. CBG on admission 194. Questionable compliance and adherence to outpatient diabetic regimen. CBGs monitored throughout hospitalization and remained appropriate. Patient discharged on diabetic regimen of jardiance 25 mg daily and metformin 1000 mg bid with close follow up with PCP scheduled.    All other issues chronic and stable.   Issues for follow up Patient will benefit from sleep study outpatient, concerned for OSA.  Recheck BMP at hospital follow up to monitor renal function.  Repeat CBG, diabetic regimen adjusted prior to discharge, adjust regimen as needed.  Recheck BP at follow up, adjust regimen as appropriate. Amlodipine and HCTZ discontinued at discharge. Uptitrate Victoza if tolerated. Consider restarting Lantus if fasting sugars are low. Consider further workup for family history of factor V Leiden mutation.  Limit nephrotoxic agents given CKD stage 3.   Significant Procedures:  none  Significant Labs and Imaging:  Recent Labs  Lab 07/04/21 0937 07/07/21 1907 07/08/21 0258  WBC 5.0 6.6 7.1  HGB 12.5* 12.4* 12.0*  HCT  40.2 39.0 36.3*  PLT 343 333 329   Recent Labs  Lab 07/04/21 0937 07/07/21 1907 07/08/21 0258  NA 135  133* 134*  K 5.0 4.1 4.0  CL 103 102 101  CO2 19* 17* 21*  GLUCOSE 199* 194* 152*  BUN _0 CREATININE 1.66* 1.74* 1.48*  CALCIUM 9.4 9.6 9.4  ALKPHOS  --  52  --   AST  --  29  --   ALT  --  44  --   ALBUMIN  --  3.6  --       Results/Tests Pending at Time of Discharge:  none  Discharge Medications:  Allergies as of 07/08/2021       Reactions   Ivp Dye [iodinated Contrast Media] Itching, Swelling   Reaction unknown per patient   Shellfish Allergy Itching   Shellfish-derived Products    Other reaction(s): hives   Vicodin [hydrocodone-acetaminophen] Itching        Medication List     STOP taking these medications    amLODipine 10 MG tablet Commonly known as: NORVASC   chlorthalidone 25 MG tablet Commonly known as: HYGROTON   insulin lispro 100 UNIT/ML KwikPen Commonly known as: HumaLOG KwikPen   omeprazole 20 MG capsule Commonly known as: PRILOSEC       TAKE these medications    Accu-Chek FastClix Lancet Kit 1 Units by Does not apply route as needed.   Accu-Chek Guide w/Device Kit USE TO TEST BLOOD SUGAR THREE TIMES DAILY WITH MEALS AS NEEDED   B-D ULTRAFINE III SHORT PEN 31G X 8 MM Misc Generic drug: Insulin Pen Needle USE AS DIRECTED TO INJECT INSULIN   cetirizine 10 MG tablet Commonly known as: ZYRTEC Take 1 tablet (10 mg total) by mouth daily.   empagliflozin 25 MG Tabs tablet Commonly known as: JARDIANCE Take 1 tablet (25 mg total) by mouth daily.   ezetimibe 10 MG tablet Commonly known as: Zetia Take 1 tablet (10 mg total) by mouth daily.   Fish Oil 1000 MG Caps Take 2 capsules by mouth daily.   glucose blood test strip Please use to check sugars before each meal and prior to bedtime.   liraglutide 18 MG/3ML Sopn Commonly known as: VICTOZA Inject 0.6 mg into the skin daily.   lisinopril 10 MG tablet Commonly known as: ZESTRIL Take 1 tablet (10 mg total) by mouth daily. What changed:  medication strength See the new  instructions.   metFORMIN 1000 MG tablet Commonly known as: GLUCOPHAGE Take 1 tablet (1,000 mg total) by mouth 2 (two) times daily with a meal. TAKE 1 TABLET(1000 MG) BY MOUTH TWICE DAILY WITH A MEAL Strength: 1,000 mg What changed: See the new instructions.   MULTIVITAMIN ADULT PO Take 1 tablet by mouth daily.   rosuvastatin 40 MG tablet Commonly known as: Crestor Take 1 tablet (40 mg total) by mouth daily.   tiZANidine 4 MG tablet Commonly known as: ZANAFLEX TAKE 1 TABLET(4 MG) BY MOUTH AT BEDTIME What changed: See the new instructions.   vitamin B-12 100 MCG tablet Commonly known as: CYANOCOBALAMIN Take 100 mcg by mouth daily.   zinc gluconate 50 MG tablet Take 50 mg by mouth daily.        Discharge Instructions: Please refer to Patient Instructions section of EMR for full details.  Patient was counseled important signs and symptoms that should prompt return to medical care, changes in medications, dietary instructions, activity restrictions, and follow up appointments.   Follow-Up Appointments:  Follow-up Information     Zola Button, MD. Go on 07/26/2021.   Specialty: Family Medicine Why: _0 :[place] information: [place] [address] [phone]                 Donney Dice, DO 07/08/2021, 3:33 PM PGY-2, [place]

## 2021-07-08 NOTE — Progress Notes (Signed)
Inpatient Diabetes Program Recommendations  AACE/ADA: New Consensus Statement on Inpatient Glycemic Control (2015)  Target Ranges:  Prepandial:   less than 140 mg/dL      Peak postprandial:   less than 180 mg/dL (1-2 hours)      Critically ill patients:  140 - 180 mg/dL   Lab Results  Component Value Date   GLUCAP 203 (H) 07/08/2021   HGBA1C 12.6 (A) 05/17/2021    Review of Glycemic Control  Diabetes history: type 2 Outpatient Diabetes medications: Humalog 16 units at supper (2-3:00 pm), Metformin 1000 mg BID, Jardiance 25 mg daily, Trulicity  Current orders for Inpatient glycemic control: Novolog SENSITIVE correction scale TID  Inpatient Diabetes Program Recommendations:   Spoke with patient and wife at the bedside. Patient was diagnosed with diabetes in 2012 and was started on Metformin at that time. He had been on Lantus 35 units daily for about 1 year, but was stopped in December 2022. He then was started on Humalog 16 units at supper and Trulicity about 1 month ago. Patient states that he was taking the Humalog around 2-3:00 pm and not eating until dinnertime. States that his blood sugars had been in the 200-300's and higher before starting the Humalog and Trulicity, but recently had been around 127 mg/dl. He was only testing once per day. States that he sometimes feels that he might have low blood sugar at night, but does not always check his blood sugar when feeling that way.He does know how to treat hypoglycemia. We reviewed the symptoms and treatment.   In talking with the patient and the wife, recommend discontinuing the Trulicity for now. In reference to the Humalog, he did not understand how the Humalog works quickly. He states that he never seemed to have problems with the Lantus that he was on. Recommend going back on Lantus (20-25 units daily which would be weight based 116.6 kg X 0.2 units/kg=23.32 units) and perhaps a small dose of Humalog 2-3 units TID with meals. If he  doesn't eat, then he does not take the Humalog. He has been using the insulin pen. He needs to check his blood sugars at least 3 times per day until he sees his PCP and keep a record.   Ordered Living Well with Diabetes booklet for him from the pharmacy. Discussed A1C of 12.6% (blood sugars close to 300 mg/dl) and how he will need to try to get it down. Getting his medications adjusted is important.   Smith Mince RN BSN CDE Diabetes Coordinator Pager: [phone]  8am-5pm

## 2021-07-12 ENCOUNTER — Other Ambulatory Visit: Payer: Self-pay | Admitting: Family Medicine

## 2021-07-12 DIAGNOSIS — E118 Type 2 diabetes mellitus with unspecified complications: Secondary | ICD-10-CM

## 2021-07-13 NOTE — Patient Instructions (Incomplete)
It was nice seeing you today!  Blood work today.  See me in 3 months or whenever is a good for you.  Stay well, Vir Whetstine, MD [facility] [phone]  --  Make sure to check out at the front desk before you leave today.  Please arrive at least 15 minutes prior to your scheduled appointments.  If you had blood work today, I will send you a MyChart message or a letter if results are normal. Otherwise, I will give you a call.  If you had a referral placed, they will call you to set up an appointment. Please give us a call if you don't hear back in the next 2 weeks.  If you need additional refills before your next appointment, please call your pharmacy first.  

## 2021-07-13 NOTE — Progress Notes (Deleted)
? ? ?  SUBJECTIVE:  ? ?CHIEF COMPLAINT / HPI:  ?No chief complaint on file. ?  ?Hospital course reviewed. Generalized weakness and fatigue possibly due to overtreatment of DM and/or HTN, possible adverse reaction to Trulicity. Workup including CT head, CXR, and labs unremarkable. Medications were adjusted. ? ?Issues for follow up ?Patient will benefit from sleep study outpatient, concerned for OSA.  ?Recheck BMP at hospital follow up to monitor renal function.  ?Repeat CBG, diabetic regimen adjusted prior to discharge, adjust regimen as needed.  ?Recheck BP at follow up, adjust regimen as appropriate. Amlodipine and HCTZ discontinued at discharge. ?Uptitrate Victoza if tolerated. ?Consider restarting Lantus if fasting sugars are low. ?Consider further workup for family history of factor V Leiden mutation.  ?Limit nephrotoxic agents given CKD stage 3.  ? ?PERTINENT  PMH / PSH: *** ? ?Patient Care Team: ?Zola Button, MD as PCP - General (Family Medicine) ?Garald Balding, MD as Consulting Physician (Orthopedic Surgery)  ? ?OBJECTIVE:  ? ?There were no vitals taken for this visit.  ?Physical Exam  ? ?Depression screen [facility] 2/9 05/17/2021  ?Decreased Interest 0  ?Down, Depressed, Hopeless 0  ?PHQ - 2 Score 0  ?Altered sleeping 0  ?Tired, decreased energy 0  ?Change in appetite 0  ?Feeling bad or failure about yourself  0  ?Trouble concentrating 0  ?Moving slowly or fidgety/restless 0  ?Suicidal thoughts 0  ?PHQ-9 Score 0  ?Difficult doing work/chores Not difficult at all  ?Some recent data might be hidden  ?  ? ?{Show previous vital signs (optional):23777} ? ?{Labs  Heme  Chem  Endocrine  Serology  Results Review (optional):23779} ? ?ASSESSMENT/PLAN:  ? ?No problem-specific Assessment & Plan notes found for this encounter. ?  ? ?No follow-ups on file.  ? ?Zola Button, MD ?[place]  ?

## 2021-07-14 ENCOUNTER — Inpatient Hospital Stay: Payer: Medicaid Other

## 2021-07-19 ENCOUNTER — Other Ambulatory Visit: Payer: Self-pay | Admitting: Family Medicine

## 2021-07-20 ENCOUNTER — Encounter: Payer: Self-pay | Admitting: Podiatry

## 2021-07-20 ENCOUNTER — Ambulatory Visit (INDEPENDENT_AMBULATORY_CARE_PROVIDER_SITE_OTHER): Payer: Medicaid Other | Admitting: Podiatry

## 2021-07-20 ENCOUNTER — Other Ambulatory Visit: Payer: Self-pay

## 2021-07-20 DIAGNOSIS — E1140 Type 2 diabetes mellitus with diabetic neuropathy, unspecified: Secondary | ICD-10-CM

## 2021-07-20 DIAGNOSIS — B351 Tinea unguium: Secondary | ICD-10-CM

## 2021-07-20 DIAGNOSIS — K219 Gastro-esophageal reflux disease without esophagitis: Secondary | ICD-10-CM | POA: Insufficient documentation

## 2021-07-20 DIAGNOSIS — N179 Acute kidney failure, unspecified: Secondary | ICD-10-CM

## 2021-07-20 DIAGNOSIS — M79676 Pain in unspecified toe(s): Secondary | ICD-10-CM

## 2021-07-20 DIAGNOSIS — E1149 Type 2 diabetes mellitus with other diabetic neurological complication: Secondary | ICD-10-CM

## 2021-07-20 NOTE — Progress Notes (Signed)
Complaint:  Visit Type: Patient returns to my office for continued preventative foot care services. Complaint: Patient states" my nails have grown long and thick and become painful to walk and wear shoes" Patient has been diagnosed with DM with no foot complications. The patient presents for preventative foot care services. No changes to ROS  Podiatric Exam: Vascular: dorsalis pedis and posterior tibial pulses are palpable bilateral. Capillary return is immediate. Temperature gradient is WNL. Skin turgor WNL  Sensorium: Normal Semmes Weinstein monofilament test. Normal tactile sensation bilaterally. Nail Exam: Pt has thick disfigured discolored nails with subungual debris noted bilateral entire nail hallux through fifth toenails Ulcer Exam: There is no evidence of ulcer or pre-ulcerative changes or infection. Orthopedic Exam: Muscle tone and strength are WNL. No limitations in general ROM. No crepitus or effusions noted. Foot type and digits show no abnormalities. Bony prominences are unremarkable. Skin: No Porokeratosis. No infection or ulcers  Diagnosis:  Onychomycosis, , Pain in right toe, pain in left toes  Treatment & Plan Procedures and Treatment: Consent by patient was obtained for treatment procedures.   Debridement of mycotic and hypertrophic toenails, 1 through 5 bilateral and clearing of subungual debris using a nail nipper and then dremel tool. . No ulceration, no infection noted.  Return Visit-Office Procedure: Patient instructed to return to the office for a follow up visit 4 months for continued evaluation and treatment.    Kiondra Caicedo DPM 

## 2021-07-25 ENCOUNTER — Other Ambulatory Visit: Payer: Self-pay

## 2021-07-25 ENCOUNTER — Ambulatory Visit (INDEPENDENT_AMBULATORY_CARE_PROVIDER_SITE_OTHER): Payer: Medicaid Other | Admitting: Family Medicine

## 2021-07-25 ENCOUNTER — Encounter: Payer: Self-pay | Admitting: Family Medicine

## 2021-07-25 VITALS — BP 136/107 | HR 111 | Ht 67.0 in | Wt 257.1 lb

## 2021-07-25 DIAGNOSIS — E1159 Type 2 diabetes mellitus with other circulatory complications: Secondary | ICD-10-CM | POA: Diagnosis not present

## 2021-07-25 DIAGNOSIS — N183 Chronic kidney disease, stage 3 unspecified: Secondary | ICD-10-CM

## 2021-07-25 DIAGNOSIS — E1122 Type 2 diabetes mellitus with diabetic chronic kidney disease: Secondary | ICD-10-CM | POA: Diagnosis not present

## 2021-07-25 DIAGNOSIS — Z09 Encounter for follow-up examination after completed treatment for conditions other than malignant neoplasm: Secondary | ICD-10-CM | POA: Diagnosis not present

## 2021-07-25 DIAGNOSIS — I10 Essential (primary) hypertension: Secondary | ICD-10-CM

## 2021-07-25 DIAGNOSIS — I152 Hypertension secondary to endocrine disorders: Secondary | ICD-10-CM | POA: Diagnosis not present

## 2021-07-25 DIAGNOSIS — E118 Type 2 diabetes mellitus with unspecified complications: Secondary | ICD-10-CM

## 2021-07-25 DIAGNOSIS — R5383 Other fatigue: Secondary | ICD-10-CM | POA: Diagnosis not present

## 2021-07-25 MED ORDER — LIRAGLUTIDE 18 MG/3ML ~~LOC~~ SOPN: 1.2000 mg | PEN_INJECTOR | Freq: Every day | SUBCUTANEOUS | 3 refills | Status: DC

## 2021-07-25 MED ORDER — AMLODIPINE BESYLATE 10 MG PO TABS: 10.0000 mg | ORAL_TABLET | Freq: Every day | ORAL | 3 refills | Status: DC

## 2021-07-25 NOTE — Progress Notes (Signed)
? ? ?  SUBJECTIVE:  ? ?CHIEF COMPLAINT / HPI:  ?Chief Complaint  ?Patient presents with  ? Follow-up  ?  hospital  ?  ?Issues for follow up ?Patient will benefit from sleep study outpatient, concerned for OSA.  ?Recheck BMP at hospital follow up to monitor renal function.  ?Repeat CBG, diabetic regimen adjusted prior to discharge, adjust regimen as needed.  ?Recheck BP at follow up, adjust regimen as appropriate. Amlodipine and HCTZ discontinued at discharge. ?Uptitrate Victoza if tolerated. ?Consider restarting Lantus if fasting sugars are low. ?Consider further workup for family history of factor V Leiden mutation.  ?Limit nephrotoxic agents given CKD stage 3.  ? ?Feeling good since discharge. ?Fasting CBG ranges from 160-250. Mostly 190s. ?Tolerating Victoza without issue. ? ?He has been taking both lisinopril and amlodipine. ? ?He does snore loudly and often feels fatigued during the day. Unsure about apneic episodes. ? ?Eating more fruits and vegetables. Less bread and potatoes. Walking every day 30 min at a time. ? ?PERTINENT  PMH / PSH: T2DM, HLD, obesity, bipolar disorder, HTN ? ?Patient Care Team: ?Zola Button, MD as PCP - General (Family Medicine) ?Garald Balding, MD as Consulting Physician (Orthopedic Surgery)  ? ?OBJECTIVE:  ? ?BP (!) 136/107   Pulse (!) 111   Ht 5\' 7"  (1.702 m)   Wt 257 lb 2 oz (116.6 kg)   BMI 40.27 kg/m?   ?Physical Exam ?Constitutional:   ?   General: He is not in acute distress. ?   Appearance: He is obese.  ?HENT:  ?   Head: Normocephalic and atraumatic.  ?Cardiovascular:  ?   Rate and Rhythm: Regular rhythm. Tachycardia present.  ?   Heart sounds: Normal heart sounds.  ?Pulmonary:  ?   Effort: Pulmonary effort is normal. No respiratory distress.  ?   Breath sounds: Normal breath sounds.  ?Musculoskeletal:  ?   Cervical back: Neck supple.  ?Neurological:  ?   Mental Status: He is alert.  ?  ? ?Depression screen [facility] 2/9 07/25/2021  ?Decreased Interest 0  ?Down, Depressed,  Hopeless 0  ?PHQ - 2 Score 0  ?Altered sleeping 0  ?Tired, decreased energy 0  ?Change in appetite 0  ?Feeling bad or failure about yourself  0  ?Trouble concentrating 0  ?Moving slowly or fidgety/restless 0  ?Suicidal thoughts 0  ?PHQ-9 Score 0  ?Difficult doing work/chores -  ?Some recent data might be hidden  ?  ? ?{Show previous vital signs (optional):23777} ? ? ? ?ASSESSMENT/PLAN:  ? ?Type 2 diabetes mellitus with complication (HCC) ?Fasting CBGs still elevated. Increase Victoza to 1.2 mg daily, uptitrate to 1.8 mg daily if tolerated. Consider restarting basal insulin if fasting sugars elevated at follow-up. ? ?Hypertension associated with diabetes ([place]) ?Diastolic elevated, will continue amlodipine and lisinopril for now. Consider increasing lisinopril at follow-up if remains elevated. ? ?Fatigue ?STOP BANG score 5. Sleep study ordered. Will check B12 levels as he is on metformin. ? ?CKD stage 3 due to type 2 diabetes mellitus ([place]) ?Recheck BMP ?  ? ?Return in about 4 weeks (around 08/22/2021) for f/u DM.  ? ?Zola Button, MD ?[place]  ?

## 2021-07-25 NOTE — Assessment & Plan Note (Signed)
Recheck BMP.

## 2021-07-25 NOTE — Assessment & Plan Note (Signed)
Fasting CBGs still elevated. Increase Victoza to 1.2 mg daily, uptitrate to 1.8 mg daily if tolerated. Consider restarting basal insulin if fasting sugars elevated at follow-up. ?

## 2021-07-25 NOTE — Patient Instructions (Addendum)
It was nice seeing you today! ? ?Increase Victoza to 1.2 mg daily. You can increase to 1.8 mg daily after 1 week if you are not having any issues. ? ?Sleep study referral is placed. ? ?Follow-up in 1 month. ? ?Stay well, ?[name], MD ?[facility] ?[phone] ?-- ? ?Make sure to check out at the front desk before you leave today. ? ?Please arrive at least 15 minutes prior to your scheduled appointments. ? ?If you had blood work today, I will send you a MyChart message or a letter if results are normal. Otherwise, I will give you a call. ? ?If you had a referral placed, they will call you to set up an appointment. Please give [place] a call if you don't hear back in the next 2 weeks. ? ?If you need additional refills before your next appointment, please call your pharmacy first.  ?

## 2021-07-25 NOTE — Assessment & Plan Note (Signed)
STOP BANG score 5. Sleep study ordered. Will check B12 levels as he is on metformin. ?

## 2021-07-25 NOTE — Assessment & Plan Note (Signed)
Diastolic elevated, will continue amlodipine and lisinopril for now. Consider increasing lisinopril at follow-up if remains elevated. ?

## 2021-07-26 ENCOUNTER — Inpatient Hospital Stay: Payer: Medicaid Other | Admitting: Family Medicine

## 2021-08-23 ENCOUNTER — Ambulatory Visit: Payer: Medicaid Other | Admitting: Family Medicine

## 2021-08-23 NOTE — Progress Notes (Deleted)
? ? ?  SUBJECTIVE:  ? ?CHIEF COMPLAINT / HPI:  ?No chief complaint on file. ?  ?T2DM ?On metformin 1000 mg BID, empagliflozin *** ? ?HTN ?Currently on amlodipine 10 mg and lisinopril 10 mg daily. ? ?PERTINENT  PMH / PSH: *** ? ?Patient Care Team: ?[name], MD as PCP - General (Family Medicine) ?Valeria Batman, MD as Consulting Physician (Orthopedic Surgery)  ? ?OBJECTIVE:  ? ?There were no vitals taken for this visit.  ?Physical Exam  ? ? ?  07/25/2021  ? 11:09 AM  ?Depression screen PHQ 2/9  ?Decreased Interest 0  ?Down, Depressed, Hopeless 0  ?PHQ - 2 Score 0  ?Altered sleeping 0  ?Tired, decreased energy 0  ?Change in appetite 0  ?Feeling bad or failure about yourself  0  ?Trouble concentrating 0  ?Moving slowly or fidgety/restless 0  ?Suicidal thoughts 0  ?PHQ-9 Score 0  ?  ? ?{Show previous vital signs (optional):23777} ? ?{Labs  Heme  Chem  Endocrine  Serology  Results Review (optional):23779} ? ?ASSESSMENT/PLAN:  ? ?No problem-specific Assessment & Plan notes found for this encounter. ?  ? ?No follow-ups on file.  ? ?[name], MD ?[facility]  ?

## 2021-08-23 NOTE — Patient Instructions (Incomplete)
It was nice seeing you today!  Blood work today.  See me in 3 months or whenever is a good for you.  Stay well, Natesha Hassey, MD  [facility] [phone]  --  Make sure to check out at the front desk before you leave today.  Please arrive at least 15 minutes prior to your scheduled appointments.  If you had blood work today, I will send you a MyChart message or a letter if results are normal. Otherwise, I will give you a call.  If you had a referral placed, they will call you to set up an appointment. Please give us a call if you don't hear back in the next 2 weeks.  If you need additional refills before your next appointment, please call your pharmacy first.  

## 2021-08-25 ENCOUNTER — Encounter (HOSPITAL_BASED_OUTPATIENT_CLINIC_OR_DEPARTMENT_OTHER): Payer: Medicaid Other | Admitting: Internal Medicine

## 2021-09-07 ENCOUNTER — Other Ambulatory Visit: Payer: Self-pay | Admitting: Family Medicine

## 2021-09-07 DIAGNOSIS — E118 Type 2 diabetes mellitus with unspecified complications: Secondary | ICD-10-CM

## 2021-09-13 ENCOUNTER — Ambulatory Visit: Payer: Medicaid Other | Admitting: Family Medicine

## 2021-09-13 NOTE — Patient Instructions (Incomplete)
It was nice seeing you today!  Blood work today.  See me in 3 months or whenever is a good for you.  Stay well, Louvinia Cumbo, MD [facility] [phone]  --  Make sure to check out at the front desk before you leave today.  Please arrive at least 15 minutes prior to your scheduled appointments.  If you had blood work today, I will send you a MyChart message or a letter if results are normal. Otherwise, I will give you a call.  If you had a referral placed, they will call you to set up an appointment. Please give us a call if you don't hear back in the next 2 weeks.  If you need additional refills before your next appointment, please call your pharmacy first.  

## 2021-09-13 NOTE — Progress Notes (Deleted)
    SUBJECTIVE:   CHIEF COMPLAINT / HPI:  No chief complaint on file.   T2DM On metformin 1000 mg BID, empagliflozin 25 mg, Victoza ***.    HTN Currently on amlodipine 10 mg and lisinopril 10 mg daily.  PERTINENT  PMH / PSH: ***  Patient Care Team: Zola Button, MD as PCP - General (Family Medicine) Garald Balding, MD as Consulting Physician (Orthopedic Surgery)   OBJECTIVE:   There were no vitals taken for this visit.  Physical Exam      07/25/2021   11:09 AM  Depression screen PHQ 2/9  Decreased Interest 0  Down, Depressed, Hopeless 0  PHQ - 2 Score 0  Altered sleeping 0  Tired, decreased energy 0  Change in appetite 0  Feeling bad or failure about yourself  0  Trouble concentrating 0  Moving slowly or fidgety/restless 0  Suicidal thoughts 0  PHQ-9 Score 0     {Show previous vital signs (optional):23777}  {Labs  Heme  Chem  Endocrine  Serology  Results Review (optional):23779}  ASSESSMENT/PLAN:   No problem-specific Assessment & Plan notes found for this encounter.    No follow-ups on file.   Zola Button, MD [place]

## 2021-09-26 ENCOUNTER — Ambulatory Visit (INDEPENDENT_AMBULATORY_CARE_PROVIDER_SITE_OTHER): Payer: Medicaid Other | Admitting: Family Medicine

## 2021-09-26 ENCOUNTER — Encounter: Payer: Self-pay | Admitting: Family Medicine

## 2021-09-26 ENCOUNTER — Other Ambulatory Visit: Payer: Self-pay

## 2021-09-26 VITALS — BP 121/88 | HR 121 | Ht 67.0 in | Wt 251.4 lb

## 2021-09-26 DIAGNOSIS — I152 Hypertension secondary to endocrine disorders: Secondary | ICD-10-CM | POA: Diagnosis not present

## 2021-09-26 DIAGNOSIS — E1159 Type 2 diabetes mellitus with other circulatory complications: Secondary | ICD-10-CM

## 2021-09-26 DIAGNOSIS — E118 Type 2 diabetes mellitus with unspecified complications: Secondary | ICD-10-CM | POA: Diagnosis present

## 2021-09-26 LAB — POCT GLYCOSYLATED HEMOGLOBIN (HGB A1C): HbA1c, POC (controlled diabetic range): 12.7 % — AB (ref 0.0–7.0)

## 2021-09-26 MED ORDER — LIRAGLUTIDE 18 MG/3ML ~~LOC~~ SOPN: 1.8000 mg | PEN_INJECTOR | Freq: Every day | SUBCUTANEOUS | 3 refills | Status: DC

## 2021-09-26 MED ORDER — INSULIN GLARGINE 100 UNIT/ML ~~LOC~~ SOLN: 10.0000 [IU] | SUBCUTANEOUS | 0 refills | Status: DC

## 2021-09-26 NOTE — Assessment & Plan Note (Signed)
Appears well controlled, advised to obtain BP cuff. No changes to medications. ?

## 2021-09-26 NOTE — Progress Notes (Signed)
    SUBJECTIVE:   CHIEF COMPLAINT / HPI:  Chief Complaint  Patient presents with   Follow-up    DM    T2DM On metformin 1000 mg BID, empagliflozin 25 mg, Victoza 1.8 mg. Walking every day now at least 30 minutes with wife. Working on diet - eating more vegetables and fruits. Mostly drinking water. Fasting sugars 160s-180s.   HTN Currently on amlodipine 10 mg and lisinopril 10 mg daily. Does not have a BP cuff at home.  PERTINENT  PMH / PSH: T2DM, HLD, obesity, bipolar disorder, HTN  Patient Care Team: [name], MD as PCP - General (Family Medicine) Valeria Batman, MD as Consulting Physician (Orthopedic Surgery)   OBJECTIVE:   BP 121/88   Pulse (!) 121   Ht 5\' 7"  (1.702 m)   Wt 251 lb 6.4 oz (114 kg)   SpO2 95%   BMI 39.37 kg/m   Physical Exam Constitutional:      General: He is not in acute distress. Cardiovascular:     Rate and Rhythm: Regular rhythm. Tachycardia present.  Pulmonary:     Effort: Pulmonary effort is normal. No respiratory distress.     Breath sounds: Normal breath sounds.  Musculoskeletal:     Cervical back: Neck supple.  Neurological:     Mental Status: He is alert.        09/26/2021    3:16 PM  Depression screen PHQ 2/9  Decreased Interest 0  Down, Depressed, Hopeless 0  PHQ - 2 Score 0  Altered sleeping 0  Tired, decreased energy 0  Change in appetite 0  Feeling bad or failure about yourself  0  Trouble concentrating 0  Moving slowly or fidgety/restless 0  Suicidal thoughts 0  PHQ-9 Score 0     {Show previous vital signs (optional):23777}  Last hemoglobin A1c Lab Results  Component Value Date   HGBA1C 12.7 (A) 09/26/2021      ASSESSMENT/PLAN:   Hypertension associated with diabetes (HCC) Appears well controlled, advised to obtain BP cuff. No changes to medications.  Type 2 diabetes mellitus with complication (HCC) Poorly controlled. - continue Victoza, metformin, empagliflozin - start Lantus 10 units daily -  keep CBG log - plan rx clinic f/u in 2 weeks for insulin titration    Return in about 2 weeks (around 10/10/2021) for pharmacy clinic f/u DM.   10/12/2021, MD [facility]

## 2021-09-26 NOTE — Assessment & Plan Note (Addendum)
Poorly controlled. ?- continue Victoza, metformin, empagliflozin ?- start Lantus 10 units daily ?- keep CBG log ?- plan rx clinic f/u in 2 weeks for insulin titration ?

## 2021-09-26 NOTE — Patient Instructions (Addendum)
It was nice seeing you today! ? ?Start Lantus 10 units per day. ? ?No changes to your other medications. ? ?Continue checking your blood sugars every day, especially in the mornings before you eat. ? ?Try to get a blood pressure cuff if you can. ? ?Stay well, ?[name], MD ?[facility] ?([phone] ? ?-- ? ?Make sure to check out at the front desk before you leave today. ? ?Please arrive at least 15 minutes prior to your scheduled appointments. ? ?If you had blood work today, I will send you a MyChart message or a letter if results are normal. Otherwise, I will give you a call. ? ?If you had a referral placed, they will call you to set up an appointment. Please give [place] a call if you don't hear back in the next 2 weeks. ? ?If you need additional refills before your next appointment, please call your pharmacy first.  ?

## 2021-09-29 ENCOUNTER — Other Ambulatory Visit: Payer: Self-pay | Admitting: Family Medicine

## 2021-09-30 ENCOUNTER — Other Ambulatory Visit: Payer: Self-pay | Admitting: Family Medicine

## 2021-10-03 ENCOUNTER — Other Ambulatory Visit: Payer: Self-pay

## 2021-10-18 ENCOUNTER — Encounter: Payer: Self-pay | Admitting: *Deleted

## 2021-10-20 ENCOUNTER — Ambulatory Visit: Payer: Medicaid Other | Admitting: Pharmacist

## 2021-11-06 ENCOUNTER — Other Ambulatory Visit: Payer: Self-pay | Admitting: Family Medicine

## 2021-11-07 ENCOUNTER — Telehealth: Payer: Self-pay

## 2021-11-07 NOTE — Telephone Encounter (Signed)
Pharmacy calls nurse line requesting clarification on Tizanidine.   Pharmacy reports he filled a 30 day supply 3x in April. Therefore, they would like the ok to fill May prescription now. Technically, patient would not be due for a refill until late July. Last 30 day fill was on 4/30 and he is asking for May prescription now.   Pharmacy suggests we up his dosage or put a note on rx stating fill 30 days apart.   Will forward to PCP.

## 2021-11-16 ENCOUNTER — Other Ambulatory Visit: Payer: Self-pay | Admitting: Family Medicine

## 2021-11-21 ENCOUNTER — Ambulatory Visit (INDEPENDENT_AMBULATORY_CARE_PROVIDER_SITE_OTHER): Payer: Medicaid Other | Admitting: Podiatry

## 2021-11-21 ENCOUNTER — Encounter: Payer: Self-pay | Admitting: Podiatry

## 2021-11-21 DIAGNOSIS — N179 Acute kidney failure, unspecified: Secondary | ICD-10-CM

## 2021-11-21 DIAGNOSIS — B351 Tinea unguium: Secondary | ICD-10-CM | POA: Diagnosis not present

## 2021-11-21 DIAGNOSIS — E1140 Type 2 diabetes mellitus with diabetic neuropathy, unspecified: Secondary | ICD-10-CM

## 2021-11-21 DIAGNOSIS — E1149 Type 2 diabetes mellitus with other diabetic neurological complication: Secondary | ICD-10-CM

## 2021-11-21 DIAGNOSIS — M79676 Pain in unspecified toe(s): Secondary | ICD-10-CM

## 2021-11-21 NOTE — Progress Notes (Signed)
Complaint:  Visit Type: Patient returns to my office for continued preventative foot care services. Complaint: Patient states" my nails have grown long and thick and become painful to walk and wear shoes" Patient has been diagnosed with DM with no foot complications. The patient presents for preventative foot care services. No changes to ROS  Podiatric Exam: Vascular: dorsalis pedis and posterior tibial pulses are palpable bilateral. Capillary return is immediate. Temperature gradient is WNL. Skin turgor WNL  Sensorium: Normal Semmes Weinstein monofilament test. Normal tactile sensation bilaterally. Nail Exam: Pt has thick disfigured discolored nails with subungual debris noted bilateral entire nail hallux through fifth toenails Ulcer Exam: There is no evidence of ulcer or pre-ulcerative changes or infection. Orthopedic Exam: Muscle tone and strength are WNL. No limitations in general ROM. No crepitus or effusions noted. Foot type and digits show no abnormalities. Bony prominences are unremarkable. Skin: No Porokeratosis. No infection or ulcers  Diagnosis:  Onychomycosis, , Pain in right toe, pain in left toes  Treatment & Plan Procedures and Treatment: Consent by patient was obtained for treatment procedures.   Debridement of mycotic and hypertrophic toenails, 1 through 5 bilateral and clearing of subungual debris using a nail nipper and then dremel tool. . No ulceration, no infection noted.  Return Visit-Office Procedure: Patient instructed to return to the office for a follow up visit 4 months for continued evaluation and treatment.    Eriyana Sweeten DPM 

## 2021-12-05 ENCOUNTER — Other Ambulatory Visit: Payer: Self-pay

## 2021-12-05 MED ORDER — TIZANIDINE HCL 4 MG PO TABS: ORAL_TABLET | ORAL | 0 refills | Status: DC

## 2021-12-05 NOTE — Telephone Encounter (Signed)
Patient calls nurse line in regards to Tizanidine.   Spoke with pharmacy and patient last picked up on 6/27. Patient is to have 30 day spacing between refills. We sent in a prescription on 7/6 to be picked up on 7/27, however pharmacy reports they never received 7/6 prescription.   Will forward to PCP to resend. Patient can pick up on 7/27.

## 2021-12-27 ENCOUNTER — Telehealth: Payer: Medicaid Other | Admitting: Nurse Practitioner

## 2021-12-27 DIAGNOSIS — J40 Bronchitis, not specified as acute or chronic: Secondary | ICD-10-CM

## 2021-12-27 MED ORDER — COVID-19 ANTIBODY TEST VI KIT: 1.0000 | PACK | Freq: Once | 1 refills | Status: AC

## 2021-12-27 MED ORDER — AZITHROMYCIN 250 MG PO TABS: ORAL_TABLET | ORAL | 0 refills | Status: AC

## 2021-12-27 MED ORDER — ALBUTEROL SULFATE HFA 108 (90 BASE) MCG/ACT IN AERS: 2.0000 | INHALATION_SPRAY | Freq: Four times a day (QID) | RESPIRATORY_TRACT | 0 refills | Status: DC | PRN

## 2021-12-27 NOTE — Progress Notes (Signed)
Virtual Visit Consent   [name], you are scheduled for a virtual visit with a [place] provider today. Just as with appointments in the office, your consent must be obtained to participate. Your consent will be active for this visit and any virtual visit you may have with one of our providers in the next 365 days. If you have a MyChart account, a copy of this consent can be sent to you electronically.  As this is a virtual visit, video technology does not allow for your provider to perform a traditional examination. This may limit your provider's ability to fully assess your condition. If your provider identifies any concerns that need to be evaluated in person or the need to arrange testing (such as labs, EKG, etc.), we will make arrangements to do so. Although advances in technology are sophisticated, we cannot ensure that it will always work on either your end or our end. If the connection with a video visit is poor, the visit may have to be switched to a telephone visit. With either a video or telephone visit, we are not always able to ensure that we have a secure connection.  By engaging in this virtual visit, you consent to the provision of healthcare and authorize for your insurance to be billed (if applicable) for the services provided during this visit. Depending on your insurance coverage, you may receive a charge related to this service.  I need to obtain your verbal consent now. Are you willing to proceed with your visit today? [name] has provided verbal consent on 12/27/2021 for a virtual visit (video or telephone). [name], FNP  Date: 12/27/2021 9:47 AM  Virtual Visit via Video Note   I, [name], connected with  [name]  ([id number], 05/13/1976) on 12/27/21 at 10:00 AM EDT by a video-enabled telemedicine application and verified that I am speaking with the correct person using two identifiers.  Location: Patient: Virtual Visit Location Patient:  Home Provider: Virtual Visit Location Provider: Home Office   I discussed the limitations of evaluation and management by telemedicine and the availability of in person appointments. The patient expressed understanding and agreed to proceed.    History of Present Illness: Marcus [name] is a 46 y.o. who identifies as a male who was assigned male at birth, and is being seen today for cough and congestion.   He has been feeling sick since the weekend.  Denies a fever.   Denies any known COVID exposure.  Has not taken a test.   Denies a history of respiratory illness including asthma  Denies need for inhalers in the past.   Mostly experiencing chest congestion and cough mild nasal congestion.   Has not had COVID in the past  He has been vaccinated   Problems:  Patient Active Problem List   Diagnosis Date Noted   Gastroesophageal reflux disease 07/20/2021   Fatigue 07/07/2021   Generalized weakness 07/07/2021   CKD stage 3 due to type 2 diabetes mellitus ([place]) 04/12/2020   Migraine 03/29/2020   Nausea 12/10/2019   Sinus tachycardia 11/24/2019   Hyperlipidemia associated with type 2 diabetes mellitus ([place]) 09/09/2019   Left hip pain 01/10/2019   Hematochezia 07/16/2017   Family history of prothrombin gene mutation 06/07/2016   Family history of factor V Leiden mutation 06/07/2016   Bipolar disorder, curr episode mixed, severe, with psychotic features ([place]) 04/24/2016   Inability to maintain erection 10/16/2015   Poor dentition 09/06/2015   Hyperparathyroidism (  [place]) 08/07/2014   Testosterone deficiency 08/05/2014   Morbid obesity ([place]) 08/03/2014   Type 2 diabetes mellitus with complication ([place]) 63/84/5364   Hypertension associated with diabetes ([place]) 06/15/2014    Allergies:  Allergies  Allergen Reactions   Ivp Dye [Iodinated Contrast Media] Itching and Swelling    Reaction unknown per patient   Shellfish Allergy Itching   Shellfish-Derived Products     Other  reaction(s): hives   Vicodin [Hydrocodone-Acetaminophen] Itching   Medications:  Current Outpatient Medications:    amLODipine (NORVASC) 10 MG tablet, Take 1 tablet (10 mg total) by mouth at bedtime., Disp: 90 tablet, Rfl: 3   B-D ULTRAFINE III SHORT PEN 31G X 8 MM MISC, USE AS DIRECTED TO INJECT INSULIN, Disp: 100 each, Rfl: 0   Blood Glucose Monitoring Suppl (ACCU-CHEK GUIDE) w/Device KIT, USE TO TEST BLOOD SUGAR THREE TIMES DAILY WITH MEALS AS NEEDED, Disp: 1 kit, Rfl: 0   cetirizine (ZYRTEC) 10 MG tablet, Take 1 tablet (10 mg total) by mouth daily. (Patient taking differently: Take 10 mg by mouth daily as needed.), Disp: 30 tablet, Rfl: 3   empagliflozin (JARDIANCE) 25 MG TABS tablet, Take 1 tablet (25 mg total) by mouth daily., Disp: 90 tablet, Rfl: 3   ezetimibe (ZETIA) 10 MG tablet, Take 1 tablet (10 mg total) by mouth daily. (Patient not taking: Reported on 07/25/2021), Disp: 90 tablet, Rfl: 3   glucose blood test strip, Please use to check sugars before each meal and prior to bedtime., Disp: 100 each, Rfl: 12   Insulin Glargine Solostar (LANTUS) 100 UNIT/ML Solostar Pen, ADMINISTER 10 UNITS UNDER THE SKIN EVERY MORNING, Disp: 12 mL, Rfl: 0   Lancets Misc. (ACCU-CHEK FASTCLIX LANCET) KIT, 1 Units by Does not apply route as needed., Disp: 1 kit, Rfl: 2   liraglutide (VICTOZA) 18 MG/3ML SOPN, Inject 1.8 mg into the skin daily., Disp: 3 mL, Rfl: 3   lisinopril (ZESTRIL) 10 MG tablet, TAKE 1 TABLET(10 MG) BY MOUTH DAILY, Disp: 30 tablet, Rfl: 2   metFORMIN (GLUCOPHAGE) 1000 MG tablet, Take 1 tablet (1,000 mg total) by mouth 2 (two) times daily with a meal. TAKE 1 TABLET(1000 MG) BY MOUTH TWICE DAILY WITH A MEAL Strength: 1,000 mg, Disp: 180 tablet, Rfl: 3   Multiple Vitamin (MULTIVITAMIN ADULT PO), Take 1 tablet by mouth daily., Disp: , Rfl:    rosuvastatin (CRESTOR) 40 MG tablet, Take 1 tablet (40 mg total) by mouth daily., Disp: 90 tablet, Rfl: 3   tiZANidine (ZANAFLEX) 4 MG tablet, TAKE 1  TABLET(4 MG) BY MOUTH AT BEDTIME AS NEEDED FOR MUSCLE SPASMS, Disp: 30 tablet, Rfl: 0   vitamin B-12 (CYANOCOBALAMIN) 100 MCG tablet, Take 100 mcg by mouth daily as needed., Disp: , Rfl:    zinc gluconate 50 MG tablet, Take 50 mg by mouth daily., Disp: , Rfl:   Observations/Objective: Patient is well-developed, well-nourished in no acute distress.  Resting comfortably  at home.  Head is normocephalic, atraumatic.  No labored breathing.  Speech is clear and coherent with logical content.  Patient is alert and oriented at baseline.    Assessment and Plan: 1. Bronchitis  - COVID-19 Antibody Test KIT; Place 1 kit into the nose once for 1 dose.  Dispense: 1 kit; Refill: 1 - azithromycin (ZITHROMAX) 250 MG tablet; Take 2 tablets on day 1, then 1 tablet daily on days 2 through 5  Dispense: 6 tablet; Refill: 0 - albuterol (VENTOLIN HFA) 108 (90 Base) MCG/ACT inhaler; Inhale 2 puffs into  the lungs every 6 (six) hours as needed for wheezing or shortness of breath.  Dispense: 8 g; Refill: 0     Follow Up Instructions:  Advised f/u if COVID test is negative for evaluation of anti-viral management  I discussed the assessment and treatment plan with the patient. The patient was provided an opportunity to ask questions and all were answered. The patient agreed with the plan and demonstrated an understanding of the instructions.  A copy of instructions were sent to the patient via MyChart unless otherwise noted below.    The patient was advised to call back or seek an in-person evaluation if the symptoms worsen or if the condition fails to improve as anticipated.  Time:  I spent 10 minutes with the patient via telehealth technology discussing the above problems/concerns.    [name], FNP

## 2021-12-29 ENCOUNTER — Other Ambulatory Visit: Payer: Self-pay | Admitting: Family Medicine

## 2021-12-30 ENCOUNTER — Other Ambulatory Visit: Payer: Self-pay | Admitting: Family Medicine

## 2022-01-23 ENCOUNTER — Other Ambulatory Visit: Payer: Self-pay | Admitting: Family Medicine

## 2022-02-11 ENCOUNTER — Other Ambulatory Visit: Payer: Self-pay | Admitting: Family Medicine

## 2022-02-21 ENCOUNTER — Other Ambulatory Visit: Payer: Self-pay | Admitting: Family Medicine

## 2022-03-13 ENCOUNTER — Ambulatory Visit: Payer: Medicaid Other | Admitting: Podiatry

## 2022-03-16 ENCOUNTER — Encounter: Payer: Medicaid Other | Admitting: Family Medicine

## 2022-03-21 ENCOUNTER — Ambulatory Visit (INDEPENDENT_AMBULATORY_CARE_PROVIDER_SITE_OTHER): Payer: Medicaid Other | Admitting: Family Medicine

## 2022-03-21 ENCOUNTER — Other Ambulatory Visit: Payer: Self-pay

## 2022-03-21 VITALS — BP 124/78 | HR 115 | Wt 257.2 lb

## 2022-03-21 DIAGNOSIS — E785 Hyperlipidemia, unspecified: Secondary | ICD-10-CM | POA: Diagnosis not present

## 2022-03-21 DIAGNOSIS — E1169 Type 2 diabetes mellitus with other specified complication: Secondary | ICD-10-CM

## 2022-03-21 DIAGNOSIS — E118 Type 2 diabetes mellitus with unspecified complications: Secondary | ICD-10-CM | POA: Diagnosis present

## 2022-03-21 LAB — POCT GLYCOSYLATED HEMOGLOBIN (HGB A1C): HbA1c, POC (controlled diabetic range): 13.6 % — AB (ref 0.0–7.0)

## 2022-03-21 MED ORDER — ROSUVASTATIN CALCIUM 40 MG PO TABS: 40.0000 mg | ORAL_TABLET | Freq: Every day | ORAL | 3 refills | Status: DC

## 2022-03-21 MED ORDER — TIZANIDINE HCL 4 MG PO TABS: ORAL_TABLET | ORAL | 0 refills | Status: DC

## 2022-03-21 NOTE — Progress Notes (Deleted)
    SUBJECTIVE:   CHIEF COMPLAINT / HPI:  No chief complaint on file.   T2DM On metformin 1000 mg BID, empagliflozin 25 mg, Victoza 1.8 mg, Lantus 10 units daily. Last visit in May was restarted on Lantus. He was to follow-up with pharmacy clinic in June but did not show up to his appointment so I have not seen him in 6 months.    PERTINENT  PMH / PSH: T2DM, HLD, obesity, bipolar disorder, HTN  Patient Care Team: Zola Button, MD as PCP - General (Family Medicine) Garald Balding, MD as Consulting Physician (Orthopedic Surgery)   OBJECTIVE:   There were no vitals taken for this visit.  Physical Exam      09/26/2021    3:16 PM  Depression screen PHQ 2/9  Decreased Interest 0  Down, Depressed, Hopeless 0  PHQ - 2 Score 0  Altered sleeping 0  Tired, decreased energy 0  Change in appetite 0  Feeling bad or failure about yourself  0  Trouble concentrating 0  Moving slowly or fidgety/restless 0  Suicidal thoughts 0  PHQ-9 Score 0     {Show previous vital signs (optional):23777}  {Labs  Heme  Chem  Endocrine  Serology  Results Review (optional):23779}  ASSESSMENT/PLAN:   No problem-specific Assessment & Plan notes found for this encounter.    No follow-ups on file.   Zola Button, MD [place]

## 2022-03-21 NOTE — Assessment & Plan Note (Signed)
Patient has only been taking his ezetimibe for the past several months and was under the mistaken impression his rosuvastatin was discontinued by someone. - Refilled patient's rosuvastatin 40 mg and instructed him to resume taking it

## 2022-03-21 NOTE — Progress Notes (Unsigned)
SUBJECTIVE:   CHIEF COMPLAINT / HPI:   Marcus [name] is a 46 y.o. male with a past medical history of T2DM, HLD, HTN, obesity, and bipolar disorder presenting to the clinic for diabetes followup.  T2DM Patient currently prescribed metformin 1000 mg BID, empagliflozin 25 mg, Victoza 1.8 mg, and Lantus 10 units daily.  Patient was restarted on Lantus at last visit in May 2023.  Patient missed appointment with pharmacy clinic in June and has not been seen in 6 months.    The patient checks his blood glucose each morning before breakfast and measurements have been near the 200s the last few weeks.  The patient thinks that while he was taking his Lantus it typically range between 110 and 150.  Patient states that he has been out of metformin for the past 2 weeks but it is currently available at his pharmacy for pickup.  There is significant confusion about when he was last taking his Lantus and how much he was taking.  Patient thinks he likely has not taken Lantus in several weeks.  There is generally some confusion about his medications patient does not know his med list well.  He did not bring his medications to clinic today.  Patient denies any peripheral neuropathy, paresthesias, frequent urination, or foot ulcers.  He endorses some changes in vision such as blurring or swimming in the past few months.  He is overdue for a diabetic ophthalmology exam since 03/02/2020 and does not remember the last time he had one.  Patient's creatinine was 1.48 on 06/2021, which is around his baseline.  He does not have a recent microalbumin/creatinine ratio on record.  Hyperlipidemia 2/2 T2DM Patient reports he has not taken his rosuvastatin in several weeks and states that he follows discontinued by someone in the past.  Patient has been taking his ezetimibe as prescribed.   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  Patient Care Team: Zola Button, MD as PCP - General (Family Medicine) Garald Balding, MD as Consulting Physician (Orthopedic Surgery)   OBJECTIVE:   BP 124/78   Pulse (!) 115   Wt 257 lb 3.2 oz (116.7 kg)   SpO2 96%   BMI 40.28 kg/m    General: Resting comfortably in chair, no acute distress, friendly, alert and at baseline. Cardiovascular: Regular rate and rhythm. Normal S1/S2. No murmurs, rubs, or gallops appreciated. 2+ radial pulses. Pulmonary: Clear bilaterally to ascultation. No increased WOB, no accessory muscle usage. No wheezes, rales, or crackles. Skin: Warm and dry, no rashes grossly. Extremities: 1+ peripheral edema bilaterally up to mid-tibia.  No peripheral diabetic ulcers.  Patient able to discriminate fine point sensation on toes bilaterally.  Point of Care Labs  09/26/21 03/21/22  HbA1c, POC 12.7 ! 13.6 !   {Show previous vital signs (optional):23777}  {Labs  Heme  Chem  Endocrine  Serology  Results Review (optional):23779}  ASSESSMENT/PLAN:   Hyperlipidemia associated with type 2 diabetes mellitus ([place]) Patient has only been taking his ezetimibe for the past several months and was under the mistaken impression his rosuvastatin was discontinued by someone. - Refilled patient's rosuvastatin 40 mg and instructed him to resume taking it  Type 2 diabetes mellitus with complication ([place]) Patient's A1c was 13.6 today, up from 12.7 in 2/23 and his diabetes is poorly controlled at this time.  The cause is likely multifactorial but a significant contributor may be patient's poor medication adherence and home medication management.  It is unclear based on history if patient is taking medication as prescribed and there appears to be some confusion concerning his various medications.  In particular, it appears he has not taken his Lantus in several weeks.  Further work and close follow up are needed to  address patient's health literacy and empower him to keep better track of his meds.  Patient also needs preventative diabetic complication screenings for retinopathy and nephropathy at this time. - Resume 10 units Lantus each morning - Patient will measure CBG 3-4 times daily and keep a record in a secure notebook - Instructed patient to contact his ophthalmologist for overdue yearly eye exam - Patient will schedule an appointment with Dr. [name] for diabetes management in 1-2 weeks - Patient unable to provide urine sample today, but will obtain urine microalbumin/Cr ratio at next visit - Emphasized importance of bringing all home medications to every visit - Close follow-up in no more than 3 months   [facility] doctor, Medical Student [facility]

## 2022-03-21 NOTE — Patient Instructions (Addendum)
It was nice seeing you today! Thank you for coming in.  Your diabetes is not doing too well and we need to work together to get you better!  We would like you to: Start checking your blood sugars 3-4 times a day and keep a list in a notebook Call your eye doctor to do a yearly eye exam Start taking your Lantus 10 units per day every morning again Start taking your Crestor (rosuvastatin) daily again, we are sending that prescription to your pharmacy  We are checking your urine today to see how your kidneys are doing.  We want to see you back in 3 months and we would like you to schedule an appointment with Dr. Valentina Lucks in the next 1-2 weeks to talk about your diabetes more.  Stay well, Marcus Button, MD [place] [phone]  --  Make sure to check out at the front desk before you leave today.  Please arrive at least 15 minutes prior to your scheduled appointments.  If you had blood work today, I will send you a MyChart message or a letter if results are normal. Otherwise, I will give you a call.  If you had a referral placed, they will call you to set up an appointment. Please give [place] a call if you don't hear back in the next 2 weeks.  If you need additional refills before your next appointment, please call your pharmacy first.

## 2022-03-21 NOTE — Assessment & Plan Note (Addendum)
Patient's A1c was 13.6 today, up from 12.7 in 2/23 and his diabetes is poorly controlled at this time.  The cause is likely multifactorial but a significant contributor may be patient's poor medication adherence and home medication management.  It is unclear based on history if patient is taking medication as prescribed and there appears to be some confusion concerning his various medications.  In particular, it appears he has not taken his Lantus in several weeks.  Further work and close follow up are needed to address patient's health literacy and empower him to keep better track of his meds.  Patient also needs preventative diabetic complication screenings for retinopathy and nephropathy at this time. - Resume 10 units Lantus each morning - Patient will measure CBG 3-4 times daily and keep a record in a secure notebook - Instructed patient to contact his ophthalmologist for overdue yearly eye exam - Patient will schedule an appointment with Dr. Valentina Lucks for diabetes management in 1-2 weeks - Patient unable to provide urine sample today, but will obtain urine microalbumin/Cr ratio at next visit - Emphasized importance of bringing all home medications to every visit - Close follow-up in no more than 3 months

## 2022-04-03 ENCOUNTER — Ambulatory Visit: Payer: Medicaid Other | Admitting: Pharmacist

## 2022-04-03 ENCOUNTER — Telehealth: Payer: Self-pay | Admitting: Pharmacist

## 2022-04-03 NOTE — Telephone Encounter (Signed)
Missed appointment today.   Call to request reschedule.   Left HIPAA  compliant message requesting return to office / reschedule when possible.

## 2022-04-08 ENCOUNTER — Other Ambulatory Visit: Payer: Self-pay | Admitting: Nurse Practitioner

## 2022-04-08 DIAGNOSIS — J40 Bronchitis, not specified as acute or chronic: Secondary | ICD-10-CM

## 2022-04-15 ENCOUNTER — Other Ambulatory Visit: Payer: Self-pay | Admitting: Family Medicine

## 2022-04-17 ENCOUNTER — Other Ambulatory Visit: Payer: Self-pay

## 2022-04-18 ENCOUNTER — Ambulatory Visit: Payer: Medicaid Other | Admitting: Pharmacist

## 2022-04-18 ENCOUNTER — Other Ambulatory Visit: Payer: Self-pay | Admitting: Family Medicine

## 2022-04-18 MED ORDER — TIZANIDINE HCL 4 MG PO TABS: 4.0000 mg | ORAL_TABLET | Freq: Every evening | ORAL | 0 refills | Status: DC | PRN

## 2022-04-20 ENCOUNTER — Ambulatory Visit (INDEPENDENT_AMBULATORY_CARE_PROVIDER_SITE_OTHER): Payer: Medicaid Other | Admitting: Pharmacist

## 2022-04-20 ENCOUNTER — Encounter: Payer: Self-pay | Admitting: Pharmacist

## 2022-04-20 VITALS — BP 131/84 | HR 99 | Wt 262.8 lb

## 2022-04-20 DIAGNOSIS — E118 Type 2 diabetes mellitus with unspecified complications: Secondary | ICD-10-CM | POA: Diagnosis not present

## 2022-04-20 DIAGNOSIS — E1159 Type 2 diabetes mellitus with other circulatory complications: Secondary | ICD-10-CM | POA: Diagnosis not present

## 2022-04-20 DIAGNOSIS — I152 Hypertension secondary to endocrine disorders: Secondary | ICD-10-CM | POA: Diagnosis not present

## 2022-04-20 MED ORDER — NOVOLOG FLEXPEN 100 UNIT/ML ~~LOC~~ SOPN: 10.0000 [IU] | PEN_INJECTOR | Freq: Every day | SUBCUTANEOUS | 11 refills | Status: DC

## 2022-04-20 MED ORDER — FREESTYLE LIBRE 2 SENSOR MISC: 11 refills | Status: DC

## 2022-04-20 MED ORDER — FREESTYLE LIBRE 2 READER DEVI: 11 refills | Status: DC

## 2022-04-20 NOTE — Patient Instructions (Signed)
It was nice to see you today!  Your goal blood sugar is 80-130 before eating and less than 180 after eating.  Medication Changes: Begin Novolog (insulin aspart) 10 units 15-20 minutes prior to dinner.  Continue Lantus (insulin glargine) 40 units daily, Victoza 1.8 mg daily, and metformin 1000 mg twice daily.   Monitor blood sugars at home and keep a log (glucometer or piece of paper) to bring with you to your next visit.  Keep up the good work with diet and exercise. Aim for a diet full of vegetables, fruit and lean meats (chicken, [place], fish). Try to limit salt intake by eating fresh or frozen vegetables (instead of canned), rinse canned vegetables prior to cooking and do not add any additional salt to meals.

## 2022-04-20 NOTE — Progress Notes (Signed)
S:    Chief Complaint  Patient presents with   Medication Management    Diabetes   46 y.o. male who presents for diabetes evaluation, education, and management. PMH is significant for T2DM, HLD, CKD, HTN, obesity.  Patient was referred and last seen by Primary Care Provider, Dr. Wynelle Link, on 03/21/2022.  At last visit, A1c was up 13.6%. Lantus (insulin glargine) 10 units daily was restarted as patient hadn't taken in several weeks.  Today, patient arrives in good spirits and presents without any assistance. Reports he has been adherent to his insulin. Endorses BG has mostly been in the 200s, a few readings in the 100s and 300s.   Patient reports Diabetes was diagnosed in diagnosed at 46 YO. A1c has been >10% for ~2 years.   Current diabetes medications include: Lantus (insulin glargine) 40 units daily, Victoza (liraglutide) 1.8 mg weekly, metformin 1000 mg BID Current hypertension medications include: lisinopril 40 mg daily, amlodipine 10 mg daily,  Current hyperlipidemia medications include: rosuvastatin 40 mg daily (not taking), ezetimibe 10 mg daily (not taking)  Patient reports adherence to taking most medications as prescribed.   Insurance coverage: Medicaid  Patient reports hypoglycemic events. Shaky hands. Few times a week. Appropriately corrects with 1/2 glass of orange juice.   Reported home fasting blood sugars: has not checked in 2 weeks due to meter malfunction. When he was checking, seeing mostly 200s. Some 100s and 300s.    Patient reports nocturia (nighttime urination). 1x Patient denies neuropathy (nerve pain). Patient denies visual changes. Patient denies self foot exams.   O:  Review of Systems  All other systems reviewed and are negative.   Physical Exam Constitutional:      Appearance: Normal appearance.  Pulmonary:     Effort: Pulmonary effort is normal.  Neurological:     Mental Status: He is alert.  Psychiatric:        Mood and Affect: Mood normal.         Behavior: Behavior normal.        Thought Content: Thought content normal.    7 day average blood glucose: did not bring to visit as meter has not been working    Lab Results  Component Value Date   HGBA1C 13.6 (A) 03/21/2022   Vitals:   04/20/22 0843  BP: 131/84  Pulse: 99  SpO2: 99%    Lipid Panel     Component Value Date/Time   CHOL 260 (H) 10/26/2020 1028   TRIG 418 (H) 10/26/2020 1028   HDL 28 (L) 10/26/2020 1028   CHOLHDL 9.3 (H) 10/26/2020 1028   CHOLHDL 7.6 (H) 06/25/2015 1050   VLDL 37 (H) 06/25/2015 1050   LDLCALC 153 (H) 10/26/2020 1028   LDLDIRECT 131 (H) 05/17/2021 1606    Clinical Atherosclerotic Cardiovascular Disease (ASCVD): No  The 10-year ASCVD risk score (Arnett DK, et al., 2019) is: 18.4%   Values used to calculate the score:     Age: 43 years     Sex: Male     Is Non-Hispanic African American: Yes     Diabetic: Yes     Tobacco smoker: No     Systolic Blood Pressure: 131 mmHg     Is BP treated: Yes     HDL Cholesterol: 28 mg/dL     Total Cholesterol: 260 mg/dL   A/P: Diabetes longstanding currently uncontrolled based on A1c (13.6%). Patient is able to verbalize appropriate hypoglycemia management plan. Medication adherence improved, now taking insulin daily.  Control is suboptimal due to needing therapy intensification. -Continued basal insulin Lantus (insulin glargine) 40 units daily.  -Started rapid insulin Novolog (insulin aspart) 10 units prior to largest meal (dinner).  -Continued GLP-1 Victoza (liraglutide) 1.8 mg daily. Questionable intolerance to Trulicity. Drug shortage issues with both Trulicity and Ozempic. May consider switching to more potent GLP-1 once drug shortages resolve.   -Defer initiation of  SGLT2-I until A1c closer to goal.  -Continued metformin 1000 mg BID.  -Patient educated on purpose, proper use, and potential adverse effects of Novolog (insulin aspart).  -Android phone not compatible with [employer].  Send prescription in for FreeStyle [place] 2 sensors and reader. Patient return to clinic in 1 week for placement.  -Extensively discussed pathophysiology of diabetes, recommended lifestyle interventions, dietary effects on blood sugar control.  -Counseled on s/sx of and management of hypoglycemia.  -Next A1c anticipated February 2024.   ASCVD risk - primary prevention in patient with diabetes. Last LDL is not at goal of <70 mg/dL. ASCVD risk factors include T2DM and 10-year ASCVD risk score of 18.4%. Moderate intensity statin indicated. Rosuvastatin recently restarted by PCP on 03/21/2022 as patient had self discontinued. -Continued rosuvastatin 40 mg daily.   Hypertension longstanding currently slightly above goal. Blood pressure goal of <130/80 mmHg. Medication adherence reported. -Continue amlodipine 10 mg daily and lisinopril 40 mg daily.   Written patient instructions provided. Patient verbalized understanding of treatment plan.  Total time in face to face counseling 35 minutes.    Follow-up:  Pharmacist 04/27/2022 for CGM placement. Patient seen with Valeda Malm, PharmD, PGY2 Pharmacy Resident.

## 2022-04-20 NOTE — Assessment & Plan Note (Signed)
Diabetes longstanding currently uncontrolled based on A1c (13.6%). Patient is able to verbalize appropriate hypoglycemia management plan. Medication adherence improved, now taking insulin daily. Control is suboptimal due to needing therapy intensification. -Continued basal insulin Lantus (insulin glargine) 40 units daily.  -Started rapid insulin Novolog (insulin aspart) 10 units prior to largest meal (dinner).  -Continued GLP-1 Victoza (liraglutide) 1.8 mg daily. Questionable intolerance to Trulicity. Drug shortage issues with both Trulicity and Ozempic. May consider switching to more potent GLP-1 once drug shortages resolve.   -Defer initiation of  SGLT2-I until A1c closer to goal.  -Continued metformin 1000 mg BID.  -Patient educated on purpose, proper use, and potential adverse effects of Novolog (insulin aspart).  -Android phone not compatible with [employer]. Send prescription in for FreeStyle [place] 2 sensors and reader. Patient return to clinic in 1 week for placement.  -Extensively discussed pathophysiology of diabetes, recommended lifestyle interventions, dietary effects on blood sugar control.  -Counseled on s/sx of and management of hypoglycemia.  -Next A1c anticipated February 2024.

## 2022-04-20 NOTE — Progress Notes (Signed)
Reviewed: Agree with Dr. Koval's documentation and management. 

## 2022-04-20 NOTE — Assessment & Plan Note (Signed)
Hypertension longstanding currently slightly above goal. Blood pressure goal of <130/80 mmHg. Medication adherence reported. -Continue amlodipine 10 mg daily and lisinopril 40 mg daily.

## 2022-04-21 ENCOUNTER — Other Ambulatory Visit (HOSPITAL_COMMUNITY): Payer: Self-pay

## 2022-04-21 ENCOUNTER — Telehealth: Payer: Self-pay

## 2022-04-21 NOTE — Telephone Encounter (Signed)
A Prior Authorization was initiated for this patients FREESTYLE LIBRE 2 READER through [employer].   Confirmation#: [id number] W  A Prior Authorization was initiated for this patients FREESTYLE LIBRE 2 SENSOR through [facility].   Confirmation#: [id number] W

## 2022-04-24 ENCOUNTER — Ambulatory Visit: Payer: Medicaid Other | Admitting: Podiatry

## 2022-04-24 NOTE — Telephone Encounter (Signed)
Prior Auth for patients medication FREESTYLE LIBRE 2 READER approved by MEDICAID from 04/21/22 to 10/18/22.  Prior Auth for patients medication FREESTYLE LIBRE 2 SENSOR approved by MEDICAID from 04/21/22 to 10/18/22.

## 2022-04-25 ENCOUNTER — Ambulatory Visit (INDEPENDENT_AMBULATORY_CARE_PROVIDER_SITE_OTHER): Payer: Medicaid Other | Admitting: Podiatry

## 2022-04-25 ENCOUNTER — Encounter: Payer: Self-pay | Admitting: Podiatry

## 2022-04-25 DIAGNOSIS — N183 Chronic kidney disease, stage 3 unspecified: Secondary | ICD-10-CM

## 2022-04-25 DIAGNOSIS — M79676 Pain in unspecified toe(s): Secondary | ICD-10-CM

## 2022-04-25 DIAGNOSIS — E1122 Type 2 diabetes mellitus with diabetic chronic kidney disease: Secondary | ICD-10-CM | POA: Diagnosis not present

## 2022-04-25 DIAGNOSIS — B351 Tinea unguium: Secondary | ICD-10-CM

## 2022-04-25 DIAGNOSIS — E1149 Type 2 diabetes mellitus with other diabetic neurological complication: Secondary | ICD-10-CM | POA: Diagnosis not present

## 2022-04-25 DIAGNOSIS — E1140 Type 2 diabetes mellitus with diabetic neuropathy, unspecified: Secondary | ICD-10-CM

## 2022-04-25 DIAGNOSIS — N179 Acute kidney failure, unspecified: Secondary | ICD-10-CM

## 2022-04-25 NOTE — Progress Notes (Signed)
Complaint:  Visit Type: Patient returns to my office for continued preventative foot care services. Complaint: Patient states" my nails have grown long and thick and become painful to walk and wear shoes" Patient has been diagnosed with DM with no foot complications. The patient presents for preventative foot care services. No changes to ROS  Podiatric Exam: Vascular: dorsalis pedis and posterior tibial pulses are palpable bilateral. Capillary return is immediate. Temperature gradient is WNL. Skin turgor WNL  Sensorium: Normal Semmes Weinstein monofilament test. Normal tactile sensation bilaterally. Nail Exam: Pt has thick disfigured discolored nails with subungual debris noted bilateral entire nail hallux through fifth toenails Ulcer Exam: There is no evidence of ulcer or pre-ulcerative changes or infection. Orthopedic Exam: Muscle tone and strength are WNL. No limitations in general ROM. No crepitus or effusions noted. Foot type and digits show no abnormalities. Bony prominences are unremarkable. Skin: No Porokeratosis. No infection or ulcers  Diagnosis:  Onychomycosis, , Pain in right toe, pain in left toes  Treatment & Plan Procedures and Treatment: Consent by patient was obtained for treatment procedures.   Debridement of mycotic and hypertrophic toenails, 1 through 5 bilateral and clearing of subungual debris using a nail nipper and then dremel tool. . No ulceration, no infection noted.  Return Visit-Office Procedure: Patient instructed to return to the office for a follow up visit 4 months for continued evaluation and treatment.    Helane Gunther DPM

## 2022-04-27 ENCOUNTER — Ambulatory Visit: Payer: Medicaid Other | Admitting: Pharmacist

## 2022-05-12 ENCOUNTER — Other Ambulatory Visit: Payer: Self-pay | Admitting: Family Medicine

## 2022-05-22 ENCOUNTER — Ambulatory Visit: Payer: Medicaid Other | Admitting: Pharmacist

## 2022-06-01 ENCOUNTER — Other Ambulatory Visit: Payer: Self-pay | Admitting: Family Medicine

## 2022-06-09 ENCOUNTER — Other Ambulatory Visit: Payer: Self-pay | Admitting: Family Medicine

## 2022-06-10 ENCOUNTER — Other Ambulatory Visit: Payer: Self-pay | Admitting: Family Medicine

## 2022-06-12 ENCOUNTER — Other Ambulatory Visit: Payer: Self-pay | Admitting: Family Medicine

## 2022-07-05 ENCOUNTER — Other Ambulatory Visit: Payer: Self-pay | Admitting: Family Medicine

## 2022-07-11 ENCOUNTER — Other Ambulatory Visit: Payer: Self-pay | Admitting: Family Medicine

## 2022-07-13 ENCOUNTER — Other Ambulatory Visit: Payer: Self-pay

## 2022-07-13 DIAGNOSIS — E119 Type 2 diabetes mellitus without complications: Secondary | ICD-10-CM

## 2022-07-13 MED ORDER — METFORMIN HCL 1000 MG PO TABS: 1000.0000 mg | ORAL_TABLET | Freq: Two times a day (BID) | ORAL | 3 refills | Status: DC

## 2022-08-03 ENCOUNTER — Other Ambulatory Visit: Payer: Self-pay | Admitting: Family Medicine

## 2022-08-18 ENCOUNTER — Encounter: Payer: Medicaid Other | Admitting: Family Medicine

## 2022-08-18 NOTE — Progress Notes (Deleted)
    SUBJECTIVE:   CHIEF COMPLAINT / HPI:  No chief complaint on file.   Patient was last seen in pharmacy clinic December 2023.  He is maintained on Lantus 40 units daily, started NovoLog 10 units prior to largest meal (dinner).  He was also continued on liraglutide 1.8 mg daily and metformin 1000 mg twice daily.  He was sent prescription for freestyle libre 2 CGM reader and sensors, he was supposed to return to clinic in 1 week for replacement but appears this did not happen.  PERTINENT  PMH / PSH: T2DM, HLD, obesity, bipolar disorder, HTN  Patient Care Team: [name], MD as PCP - General (Family Medicine) Valeria Batman, MD (Inactive) as Consulting Physician (Orthopedic Surgery)   OBJECTIVE:   There were no vitals taken for this visit.  Physical Exam      03/21/2022    3:37 PM  Depression screen PHQ 2/9  Decreased Interest 0  Down, Depressed, Hopeless 0  PHQ - 2 Score 0  Altered sleeping 0  Tired, decreased energy 0  Change in appetite 0  Feeling bad or failure about yourself  0  Trouble concentrating 0  Moving slowly or fidgety/restless 0  Suicidal thoughts 0  PHQ-9 Score 0  Difficult doing work/chores Not difficult at all     {Show previous vital signs (optional):23777}  {Labs  Heme  Chem  Endocrine  Serology  Results Review (optional):23779}  ASSESSMENT/PLAN:   No problem-specific Assessment & Plan notes found for this encounter.    No follow-ups on file.   [name], MD [facility]

## 2022-08-25 ENCOUNTER — Ambulatory Visit: Payer: Medicaid Other | Admitting: Podiatry

## 2022-08-29 ENCOUNTER — Other Ambulatory Visit: Payer: Self-pay | Admitting: Family Medicine

## 2022-09-04 ENCOUNTER — Ambulatory Visit: Payer: Medicaid Other | Admitting: Podiatry

## 2022-09-13 ENCOUNTER — Encounter: Payer: Self-pay | Admitting: Family Medicine

## 2022-09-13 ENCOUNTER — Ambulatory Visit (INDEPENDENT_AMBULATORY_CARE_PROVIDER_SITE_OTHER): Payer: Medicaid Other | Admitting: Family Medicine

## 2022-09-13 VITALS — BP 162/117 | HR 102 | Ht 67.0 in | Wt 250.5 lb

## 2022-09-13 DIAGNOSIS — J302 Other seasonal allergic rhinitis: Secondary | ICD-10-CM | POA: Diagnosis not present

## 2022-09-13 DIAGNOSIS — E1159 Type 2 diabetes mellitus with other circulatory complications: Secondary | ICD-10-CM

## 2022-09-13 DIAGNOSIS — E1169 Type 2 diabetes mellitus with other specified complication: Secondary | ICD-10-CM

## 2022-09-13 DIAGNOSIS — E118 Type 2 diabetes mellitus with unspecified complications: Secondary | ICD-10-CM

## 2022-09-13 DIAGNOSIS — I152 Hypertension secondary to endocrine disorders: Secondary | ICD-10-CM

## 2022-09-13 DIAGNOSIS — E785 Hyperlipidemia, unspecified: Secondary | ICD-10-CM | POA: Diagnosis not present

## 2022-09-13 LAB — POCT GLYCOSYLATED HEMOGLOBIN (HGB A1C): HbA1c POC (<> result, manual entry): >15 % (ref 4.0–5.6)

## 2022-09-13 MED ORDER — NOVOLOG FLEXPEN 100 UNIT/ML ~~LOC~~ SOPN
5.0000 [IU] | PEN_INJECTOR | Freq: Every day | SUBCUTANEOUS | 11 refills | Status: DC
Start: 2022-09-13 — End: 2023-06-24

## 2022-09-13 MED ORDER — FLUTICASONE PROPIONATE 50 MCG/ACT NA SUSP
1.0000 | Freq: Every day | NASAL | 12 refills | Status: DC
Start: 2022-09-13 — End: 2022-11-05

## 2022-09-13 MED ORDER — VICTOZA 18 MG/3ML ~~LOC~~ SOPN: 1.8000 mg | PEN_INJECTOR | Freq: Every day | SUBCUTANEOUS | 0 refills | Status: DC

## 2022-09-13 MED ORDER — INSULIN GLARGINE SOLOSTAR 100 UNIT/ML ~~LOC~~ SOPN
45.0000 [IU] | PEN_INJECTOR | Freq: Every day | SUBCUTANEOUS | 2 refills | Status: DC
Start: 2022-09-13 — End: 2023-02-19

## 2022-09-13 NOTE — Assessment & Plan Note (Signed)
Uncontrolled today.  Discussed adjusting medication regimen, patient declined today and I recommended that he obtain a BP cuff and monitor his blood pressure daily.

## 2022-09-13 NOTE — Progress Notes (Signed)
SUBJECTIVE:   CHIEF COMPLAINT / HPI:    Diabetes meds: Lantus 35-40 units daily, reports Novolog gave him side effects (made him feel funny) so he stopped. Victoza 1.8 mg daily, metformin 1000 mg BID. Did not get CGM. Checking sugars once or twice a day. Fasting CBGs: 200s. Evening CBGs: high 100s. Walking 3 times a week. Trying to cut back on sweets and potatoes. Drinking a lot of water.  Taking lisinopril and amlodipine for blood pressure. Not checking blood pressure at home. He did not take his blood pressure medications this morning yet.  Allergy symptoms: eye itching and watering, sneezing, sometimes congestion.  Has had some headaches behind the eyes.  Taking Benadryl.  Denies weakness, numbness, tingling, vision changes, chest pain, shortness of breath.  PERTINENT  PMH / PSH: T2DM, HLD, obesity, bipolar disorder, HTN  Patient Care Team: [name], MD as PCP - General (Family Medicine) Valeria Batman, MD (Inactive) as Consulting Physician (Orthopedic Surgery)   OBJECTIVE:   BP (!) 162/117   Pulse (!) 102   Ht 5\' 7"  (1.702 m)   Wt 250 lb 8 oz (113.6 kg)   SpO2 99%   BMI 39.23 kg/m   Physical Exam Constitutional:      General: He is not in acute distress.    Appearance: He is obese.  Cardiovascular:     Rate and Rhythm: Normal rate and regular rhythm.  Pulmonary:     Effort: Pulmonary effort is normal. No respiratory distress.     Breath sounds: Normal breath sounds.  Neurological:     Mental Status: He is alert.         09/13/2022    8:54 AM  Depression screen PHQ 2/9  Decreased Interest 0  Down, Depressed, Hopeless 0  PHQ - 2 Score 0  Altered sleeping 0  Tired, decreased energy 0  Change in appetite 0  Feeling bad or failure about yourself  0  Trouble concentrating 0  Moving slowly or fidgety/restless 0  Suicidal thoughts 0  PHQ-9 Score 0     {Show previous vital signs (optional):23777}    ASSESSMENT/PLAN:   Problem List Items  Addressed This Visit       Cardiovascular and Mediastinum   Hypertension associated with diabetes (HCC)    Uncontrolled today.  Discussed adjusting medication regimen, patient declined today and I recommended that he obtain a BP cuff and monitor his blood pressure daily.      Relevant Medications   liraglutide (VICTOZA) 18 MG/3ML SOPN   insulin aspart (NOVOLOG FLEXPEN) 100 UNIT/ML FlexPen   Insulin Glargine Solostar (LANTUS) 100 UNIT/ML Solostar Pen   Other Relevant Orders   Comprehensive metabolic panel     Endocrine   Type 2 diabetes mellitus with complication (HCC) - Primary (Chronic)    Uncontrolled, he did not tolerate NovoLog but wonder if he was going hypoglycemic and not checking his blood sugars.  He has had poor follow-up for his diabetes.  Will increase his insulin regimen, refer to nutrition. - increase his Lantus to 45 units daily - advised to restart Novolog at 5 units once daily with largest meal - advised to check sugars 3-4 times a day - continue metformin, liraglutide - follow-up in pharmacy clinic in 1-2 weeks      Relevant Medications   liraglutide (VICTOZA) 18 MG/3ML SOPN   insulin aspart (NOVOLOG FLEXPEN) 100 UNIT/ML FlexPen   Insulin Glargine Solostar (LANTUS) 100 UNIT/ML Solostar Pen   Other Relevant Orders  HgB A1c (Completed)   Microalbumin/Creatinine Ratio, Urine   Amb ref to Medical Nutrition Therapy-MNT   Hyperlipidemia associated with type 2 diabetes mellitus (HCC)   Relevant Medications   liraglutide (VICTOZA) 18 MG/3ML SOPN   insulin aspart (NOVOLOG FLEXPEN) 100 UNIT/ML FlexPen   Insulin Glargine Solostar (LANTUS) 100 UNIT/ML Solostar Pen   Other Relevant Orders   Lipid Panel     Other   Seasonal allergies    Uncontrolled allergy symptoms with diphenhydramine.  Advised to start Flonase, take Allegra (which he has at home) if needed.  Advised to discontinue diphenhydramine.      Relevant Medications   fluticasone (FLONASE) 50 MCG/ACT  nasal spray      Return in about 1 week (around 09/20/2022) for f/u diabetes pharmacy clinic.   [name], MD [facility]

## 2022-09-13 NOTE — Patient Instructions (Addendum)
It was nice seeing you today!  Start taking the Novolog again, 5 units with dinner.  Try to check your blood sugar 3-4 times per day each morning and before you eat.  Use the Flonase nasal spray every day.  Schedule a visit with Dr. [name] in the next 1-2 weeks for diabetes.  Stay well, Marcus Deeds, MD [facility] [phone]  --  Make sure to check out at the front desk before you leave today.  Please arrive at least 15 minutes prior to your scheduled appointments.  If you had blood work today, I will send you a MyChart message or a letter if results are normal. Otherwise, I will give you a call.  If you had a referral placed, they will call you to set up an appointment. Please give [place] a call if you don't hear back in the next 2 weeks.  If you need additional refills before your next appointment, please call your pharmacy first.

## 2022-09-13 NOTE — Assessment & Plan Note (Signed)
Uncontrolled allergy symptoms with diphenhydramine.  Advised to start Flonase, take Allegra (which he has at home) if needed.  Advised to discontinue diphenhydramine.

## 2022-09-13 NOTE — Assessment & Plan Note (Addendum)
Uncontrolled, he did not tolerate NovoLog but wonder if he was going hypoglycemic and not checking his blood sugars.  He has had poor follow-up for his diabetes.  Will increase his insulin regimen, refer to nutrition. - increase his Lantus to 45 units daily - advised to restart Novolog at 5 units once daily with largest meal - advised to check sugars 3-4 times a day - continue metformin, liraglutide - follow-up in pharmacy clinic in 1-2 weeks

## 2022-09-14 ENCOUNTER — Encounter: Payer: Self-pay | Admitting: Podiatry

## 2022-09-14 ENCOUNTER — Ambulatory Visit (INDEPENDENT_AMBULATORY_CARE_PROVIDER_SITE_OTHER): Payer: Medicaid Other | Admitting: Podiatry

## 2022-09-14 DIAGNOSIS — M79676 Pain in unspecified toe(s): Secondary | ICD-10-CM

## 2022-09-14 DIAGNOSIS — E1149 Type 2 diabetes mellitus with other diabetic neurological complication: Secondary | ICD-10-CM | POA: Diagnosis not present

## 2022-09-14 DIAGNOSIS — B351 Tinea unguium: Secondary | ICD-10-CM | POA: Diagnosis not present

## 2022-09-14 DIAGNOSIS — E1140 Type 2 diabetes mellitus with diabetic neuropathy, unspecified: Secondary | ICD-10-CM

## 2022-09-14 LAB — MICROALBUMIN / CREATININE URINE RATIO
Creatinine, Urine: 29.8 mg/dL
Microalb/Creat Ratio: 762 mg/g creat — ABNORMAL HIGH (ref 0–29)
Microalbumin, Urine: 227 ug/mL

## 2022-09-14 LAB — COMPREHENSIVE METABOLIC PANEL
ALT: 34 IU/L (ref 0–44)
AST: 29 IU/L (ref 0–40)
Albumin/Globulin Ratio: 1.2 (ref 1.2–2.2)
Albumin: 4.1 g/dL (ref 4.1–5.1)
Alkaline Phosphatase: 114 IU/L (ref 44–121)
BUN/Creatinine Ratio: 10 (ref 9–20)
BUN: 12 mg/dL (ref 6–24)
Bilirubin Total: <0.2 mg/dL (ref 0.0–1.2)
CO2: 22 mmol/L (ref 20–29)
Calcium: 10.3 mg/dL — ABNORMAL HIGH (ref 8.7–10.2)
Chloride: 96 mmol/L (ref 96–106)
Creatinine, Ser: 1.23 mg/dL (ref 0.76–1.27)
Globulin, Total: 3.4 g/dL (ref 1.5–4.5)
Glucose: 395 mg/dL — ABNORMAL HIGH (ref 70–99)
Potassium: 4.4 mmol/L (ref 3.5–5.2)
Sodium: 136 mmol/L (ref 134–144)
Total Protein: 7.5 g/dL (ref 6.0–8.5)
eGFR: 73 mL/min/{1.73_m2} (ref >59)

## 2022-09-14 LAB — LIPID PANEL
Chol/HDL Ratio: 8.9 ratio — ABNORMAL HIGH (ref 0.0–5.0)
Cholesterol, Total: 294 mg/dL — ABNORMAL HIGH (ref 100–199)
HDL: 33 mg/dL — ABNORMAL LOW (ref >39)
LDL Chol Calc (NIH): 130 mg/dL — ABNORMAL HIGH (ref 0–99)
Triglycerides: 700 mg/dL (ref 0–149)
VLDL Cholesterol Cal: 131 mg/dL — ABNORMAL HIGH (ref 5–40)

## 2022-09-14 NOTE — Progress Notes (Signed)
Complaint:  Visit Type: Patient returns to my office for continued preventative foot care services. Complaint: Patient states" my nails have grown long and thick and become painful to walk and wear shoes" Patient has been diagnosed with DM with no foot complications. The patient presents for preventative foot care services. No changes to ROS  Podiatric Exam: Vascular: dorsalis pedis and posterior tibial pulses are palpable bilateral. Capillary return is immediate. Temperature gradient is WNL. Skin turgor WNL  Sensorium: Normal Semmes Weinstein monofilament test. Normal tactile sensation bilaterally. Nail Exam: Pt has thick disfigured discolored nails with subungual debris noted bilateral entire nail hallux through fifth toenails Ulcer Exam: There is no evidence of ulcer or pre-ulcerative changes or infection. Orthopedic Exam: Muscle tone and strength are WNL. No limitations in general ROM. No crepitus or effusions noted. Foot type and digits show no abnormalities. Bony prominences are unremarkable. Skin: No Porokeratosis. No infection or ulcers  Diagnosis:  Onychomycosis, , Pain in right toe, pain in left toes  Treatment & Plan Procedures and Treatment: Consent by patient was obtained for treatment procedures.   Debridement of mycotic and hypertrophic toenails, 1 through 5 bilateral and clearing of subungual debris using a nail nipper and then dremel tool. . No ulceration, no infection noted.  Return Visit-Office Procedure: Patient instructed to return to the office for a follow up visit 4 months for continued evaluation and treatment.    Davarius Ridener DPM 

## 2022-09-15 ENCOUNTER — Telehealth: Payer: Self-pay

## 2022-09-15 ENCOUNTER — Telehealth: Payer: Medicaid Other | Admitting: Family Medicine

## 2022-09-15 DIAGNOSIS — K047 Periapical abscess without sinus: Secondary | ICD-10-CM | POA: Diagnosis not present

## 2022-09-15 DIAGNOSIS — E781 Pure hyperglyceridemia: Secondary | ICD-10-CM

## 2022-09-15 MED ORDER — NAPROXEN 500 MG PO TABS: 500.0000 mg | ORAL_TABLET | Freq: Two times a day (BID) | ORAL | 0 refills | Status: AC

## 2022-09-15 MED ORDER — CLINDAMYCIN HCL 300 MG PO CAPS: 300.0000 mg | ORAL_CAPSULE | Freq: Three times a day (TID) | ORAL | 0 refills | Status: AC

## 2022-09-15 NOTE — Telephone Encounter (Signed)
Patient calls nurse line requesting to speak with PCP about recent labs.   He reports he viewed them on mychart.   Will forward to PCP.

## 2022-09-15 NOTE — Telephone Encounter (Signed)
Spoke to patient regarding lab results.  He was wondering if his kidney was functioning OK. Discussed that his creatinine actually looks improved from prior values, but he does have severe range microalbuminuria. Offered nephrology referral, he declined at this time but will think about it. Discussed importance of getting diabetes under control. Will plan to restart SGLT2i once A1c better controlled.  Triglycerides significantly elevated. Patient reports he was fasting then. Discussed that we will recheck fasting levels.  Patient reports he will make appointment with pharmacy clinic on Monday. Discussed that he can have his labs drawn at that visit.

## 2022-09-15 NOTE — Progress Notes (Signed)

## 2022-09-21 ENCOUNTER — Other Ambulatory Visit: Payer: Self-pay | Admitting: Family Medicine

## 2022-09-30 ENCOUNTER — Other Ambulatory Visit: Payer: Self-pay | Admitting: Family Medicine

## 2022-09-30 DIAGNOSIS — E118 Type 2 diabetes mellitus with unspecified complications: Secondary | ICD-10-CM

## 2022-10-03 ENCOUNTER — Other Ambulatory Visit: Payer: Self-pay

## 2022-10-03 DIAGNOSIS — E118 Type 2 diabetes mellitus with unspecified complications: Secondary | ICD-10-CM

## 2022-10-03 MED ORDER — EMPAGLIFLOZIN 25 MG PO TABS: 25.0000 mg | ORAL_TABLET | Freq: Every day | ORAL | 3 refills | Status: DC

## 2022-10-06 ENCOUNTER — Other Ambulatory Visit: Payer: Self-pay

## 2022-10-06 MED ORDER — TIZANIDINE HCL 4 MG PO TABS: ORAL_TABLET | ORAL | 0 refills | Status: DC

## 2022-10-25 ENCOUNTER — Other Ambulatory Visit: Payer: Self-pay

## 2022-10-25 ENCOUNTER — Encounter (HOSPITAL_COMMUNITY): Payer: Self-pay | Admitting: Emergency Medicine

## 2022-10-25 ENCOUNTER — Ambulatory Visit (HOSPITAL_COMMUNITY)
Admission: EM | Admit: 2022-10-25 | Discharge: 2022-10-25 | Disposition: A | Discharge status: Home / Self Care | Payer: Medicaid Other | Attending: Emergency Medicine | Admitting: Emergency Medicine

## 2022-10-25 DIAGNOSIS — S161XXA Strain of muscle, fascia and tendon at neck level, initial encounter: Secondary | ICD-10-CM | POA: Diagnosis not present

## 2022-10-25 MED ORDER — METHOCARBAMOL 500 MG PO TABS: 500.0000 mg | ORAL_TABLET | Freq: Two times a day (BID) | ORAL | 0 refills | Status: DC
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
## 2022-10-31 ENCOUNTER — Other Ambulatory Visit: Payer: Self-pay | Admitting: Family Medicine

## 2022-11-05 ENCOUNTER — Telehealth: Payer: Medicaid Other | Admitting: Family

## 2022-11-05 DIAGNOSIS — J209 Acute bronchitis, unspecified: Secondary | ICD-10-CM | POA: Diagnosis not present

## 2022-11-05 MED ORDER — BENZONATATE 100 MG PO CAPS
100.0000 mg | ORAL_CAPSULE | Freq: Three times a day (TID) | ORAL | 0 refills | Status: DC | PRN
Start: 2022-11-05 — End: 2023-04-02

## 2022-11-05 MED ORDER — CETIRIZINE HCL 10 MG PO TABS
10.0000 mg | ORAL_TABLET | Freq: Every day | ORAL | 1 refills | Status: DC
Start: 2022-11-05 — End: 2023-07-15

## 2022-11-05 MED ORDER — FLUTICASONE PROPIONATE 50 MCG/ACT NA SUSP
2.0000 | Freq: Every day | NASAL | 6 refills | Status: DC
Start: 2022-11-05 — End: 2023-08-29

## 2022-11-05 NOTE — Progress Notes (Signed)
Virtual Visit Consent   Marcus [name], you are scheduled for a virtual visit with a [facility] provider today. Just as with appointments in the office, your consent must be obtained to participate. Your consent will be active for this visit and any virtual visit you may have with one of our providers in the next 365 days. If you have a MyChart account, a copy of this consent can be sent to you electronically.  As this is a virtual visit, video technology does not allow for your provider to perform a traditional examination. This may limit your provider's ability to fully assess your condition. If your provider identifies any concerns that need to be evaluated in person or the need to arrange testing (such as labs, EKG, etc.), we will make arrangements to do so. Although advances in technology are sophisticated, we cannot ensure that it will always work on either your end or our end. If the connection with a video visit is poor, the visit may have to be switched to a telephone visit. With either a video or telephone visit, we are not always able to ensure that we have a secure connection.  By engaging in this virtual visit, you consent to the provision of healthcare and authorize for your insurance to be billed (if applicable) for the services provided during this visit. Depending on your insurance coverage, you may receive a charge related to this service.  I need to obtain your verbal consent now. Are you willing to proceed with your visit today? Marcus [name] has provided verbal consent on 11/05/2022 for a virtual visit (video or telephone). Jannifer Rodney, FNP  Date: 11/05/2022 10:12 AM  Virtual Visit via Video Note   I, Jannifer Rodney, connected with  Marcus [name]  ([id number], 1975/08/06) on 11/05/22 at 10:15 AM EDT by a video-enabled telemedicine application and verified that I am speaking with the correct person using two identifiers.  Location: Patient: Virtual Visit Location Patient:  Home Provider: Virtual Visit Location Provider: Home Office   I discussed the limitations of evaluation and management by telemedicine and the availability of in person appointments. The patient expressed understanding and agreed to proceed.    History of Present Illness: Marcus [name] is a 47 y.o. who identifies as a male who was assigned male at birth, and is being seen today for cough that started last week.  HPI: Cough This is a new problem. The current episode started 1 to 4 weeks ago. The problem has been gradually worsening. The problem occurs every few minutes. The cough is Non-productive. Associated symptoms include headaches and nasal congestion. Pertinent negatives include no chills, ear pain, fever, shortness of breath or wheezing. He has tried rest for the symptoms. The treatment provided mild relief.    Problems:  Patient Active Problem List   Diagnosis Date Noted   Gastroesophageal reflux disease 07/20/2021   Fatigue 07/07/2021   CKD stage 3 due to type 2 diabetes mellitus (HCC) 04/12/2020   Migraine 03/29/2020   Sinus tachycardia 11/24/2019   Hyperlipidemia associated with type 2 diabetes mellitus (HCC) 09/09/2019   Left hip pain 01/10/2019   Family history of prothrombin gene mutation 06/07/2016   Family history of factor V Leiden mutation 06/07/2016   Bipolar disorder, curr episode mixed, severe, with psychotic features (HCC) 04/24/2016   Inability to maintain erection 10/16/2015   Poor dentition 09/06/2015   Seasonal allergies 12/29/2014   Hyperparathyroidism (HCC) 08/07/2014   Testosterone deficiency 08/05/2014   Morbid  obesity (HCC) 08/03/2014   Type 2 diabetes mellitus with complication (HCC) 06/15/2014   Hypertension associated with diabetes (HCC) 06/15/2014    Allergies:  Allergies  Allergen Reactions   Ivp Dye [Iodinated Contrast Media] Itching and Swelling    Reaction unknown per patient   Shellfish Allergy Itching   Shellfish-Derived Products      Other reaction(s): hives   Vicodin [Hydrocodone-Acetaminophen] Itching   Medications:  Current Outpatient Medications:    benzonatate (TESSALON PERLES) 100 MG capsule, Take 1 capsule (100 mg total) by mouth 3 (three) times daily as needed., Disp: 20 capsule, Rfl: 0   cetirizine (ZYRTEC ALLERGY) 10 MG tablet, Take 1 tablet (10 mg total) by mouth daily., Disp: 90 tablet, Rfl: 1   fluticasone (FLONASE) 50 MCG/ACT nasal spray, Place 2 sprays into both nostrils daily., Disp: 16 g, Rfl: 6   amLODipine (NORVASC) 10 MG tablet, TAKE 1 TABLET(10 MG) BY MOUTH AT BEDTIME, Disp: 90 tablet, Rfl: 1   B-D ULTRAFINE III SHORT PEN 31G X 8 MM MISC, USE AS DIRECTED TO INJECT INSULIN, Disp: 100 each, Rfl: 0   Blood Glucose Monitoring Suppl (ACCU-CHEK GUIDE) w/Device KIT, USE TO TEST BLOOD SUGAR THREE TIMES DAILY WITH MEALS AS NEEDED, Disp: 1 kit, Rfl: 0   Continuous Blood Gluc Receiver (FREESTYLE LIBRE 2 READER) DEVI, Use as directed to check blood sugar continuously, Disp: 1 each, Rfl: 11   Continuous Blood Gluc Sensor (FREESTYLE LIBRE 2 SENSOR) MISC, Use as directed to check blood sugar continuously, Disp: 1 each, Rfl: 11   empagliflozin (JARDIANCE) 25 MG TABS tablet, Take 1 tablet (25 mg total) by mouth daily., Disp: 90 tablet, Rfl: 3   ezetimibe (ZETIA) 10 MG tablet, Take 1 tablet (10 mg total) by mouth daily. (Patient not taking: Reported on 04/20/2022), Disp: 90 tablet, Rfl: 3   glucose blood test strip, Please use to check sugars before each meal and prior to bedtime., Disp: 100 each, Rfl: 12   insulin aspart (NOVOLOG FLEXPEN) 100 UNIT/ML FlexPen, Inject 5 Units into the skin daily. Prior to largest meal., Disp: 15 mL, Rfl: 11   Insulin Glargine Solostar (LANTUS) 100 UNIT/ML Solostar Pen, Inject 45 Units as directed daily., Disp: 15 mL, Rfl: 2   Lancets Misc. (ACCU-CHEK FASTCLIX LANCET) KIT, 1 Units by Does not apply route as needed., Disp: 1 kit, Rfl: 2   lisinopril (ZESTRIL) 40 MG tablet, TAKE 1 TABLET(40 MG)  BY MOUTH DAILY, Disp: 90 tablet, Rfl: 0   metFORMIN (GLUCOPHAGE) 1000 MG tablet, Take 1 tablet (1,000 mg total) by mouth 2 (two) times daily with a meal. TAKE 1 TABLET(1000 MG) BY MOUTH TWICE DAILY WITH A MEAL Strength: 1,000 mg, Disp: 180 tablet, Rfl: 3   Multiple Vitamin (MULTIVITAMIN ADULT PO), Take 1 tablet by mouth daily., Disp: , Rfl:    rosuvastatin (CRESTOR) 40 MG tablet, Take 1 tablet (40 mg total) by mouth daily. (Patient not taking: Reported on 04/20/2022), Disp: 90 tablet, Rfl: 3   tiZANidine (ZANAFLEX) 4 MG tablet, TAKE 1 TABLET(4 MG) BY MOUTH AT BEDTIME AS NEEDED FOR MUSCLE SPASMS, Disp: 30 tablet, Rfl: 0   VICTOZA 18 MG/3ML SOPN, ADMINISTER 1.8 MG UNDER THE SKIN DAILY, Disp: 9 mL, Rfl: 0   vitamin B-12 (CYANOCOBALAMIN) 100 MCG tablet, Take 100 mcg by mouth daily as needed., Disp: , Rfl:    zinc gluconate 50 MG tablet, Take 50 mg by mouth daily., Disp: , Rfl:   Observations/Objective: Patient is well-developed, well-nourished in no acute distress.  Resting comfortably  at home.  Head is normocephalic, atraumatic.  No labored breathing.  Speech is clear and coherent with logical content.  Patient is alert and oriented at baseline.  Dry not productive cough  Assessment and Plan: 1. Acute bronchitis, unspecified organism - fluticasone (FLONASE) 50 MCG/ACT nasal spray; Place 2 sprays into both nostrils daily.  Dispense: 16 g; Refill: 6 - cetirizine (ZYRTEC ALLERGY) 10 MG tablet; Take 1 tablet (10 mg total) by mouth daily.  Dispense: 90 tablet; Refill: 1 - benzonatate (TESSALON PERLES) 100 MG capsule; Take 1 capsule (100 mg total) by mouth 3 (three) times daily as needed.  Dispense: 20 capsule; Refill: 0  - Take meds as prescribed - Use a cool mist humidifier  -Use saline nose sprays frequently -Force fluids -For any cough or congestion  Use plain Mucinex- regular strength or max strength is fine -For fever or aces or pains- take tylenol or ibuprofen. -Throat lozenges if  help -New toothbrush in 3 days Follow up if symptoms worsen or do not improve   Follow Up Instructions: I discussed the assessment and treatment plan with the patient. The patient was provided an opportunity to ask questions and all were answered. The patient agreed with the plan and demonstrated an understanding of the instructions.  A copy of instructions were sent to the patient via MyChart unless otherwise noted below.     The patient was advised to call back or seek an in-person evaluation if the symptoms worsen or if the condition fails to improve as anticipated.  Time:  I spent 8 minutes with the patient via telehealth technology discussing the above problems/concerns.    Jannifer Rodney, FNP

## 2022-11-05 NOTE — Patient Instructions (Signed)

## 2022-11-11 ENCOUNTER — Other Ambulatory Visit: Payer: Self-pay | Admitting: Family Medicine

## 2022-11-13 ENCOUNTER — Encounter: Payer: Self-pay | Admitting: Family Medicine

## 2022-11-13 MED ORDER — TIZANIDINE HCL 4 MG PO TABS: ORAL_TABLET | ORAL | 0 refills | Status: DC

## 2022-11-13 NOTE — Addendum Note (Signed)
Addended by: Veronda Prude on: 11/13/2022 03:56 PM   Modules accepted: Orders
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
## 2022-11-13 NOTE — Addendum Note (Signed)
Addended by: Gerrit Heck on: 11/13/2022 12:15 PM   Modules accepted: Orders

## 2022-11-13 NOTE — Telephone Encounter (Signed)
Dr. Miquel Dunn gave verbal authorization to pharmacist to fill early.   Patient to follow up with PCP prior to additional refills.   Veronda Prude, RN

## 2022-11-13 NOTE — Telephone Encounter (Signed)
Patient returns call to nurse line. He reports that there is an issue with prescription.   Called pharmacy. There are two issues.   Pharmacist reports that patient picked up a quantity of 30 on 11/01/22. They wanted to make sure it is okay with provider for patient to receive early fill.  Dr. Hyacinth Meeker is not enrolled in Medicaid. They are needing a medicaid enrolled provider to send in prescription.   Forwarding to PCP and preceptor Pollie Meyer)  Veronda Prude, RN
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
## 2022-11-20 ENCOUNTER — Ambulatory Visit: Payer: Medicaid Other | Admitting: Family Medicine

## 2022-11-20 NOTE — Progress Notes (Deleted)
    SUBJECTIVE:   CHIEF COMPLAINT / HPI:   Overfilling Tizanidine. States its been a long term script. Need to re-evaluate timing/dosage and go over proper use.   PERTINENT  PMH / PSH: ***  OBJECTIVE:   There were no vitals taken for this visit.  ***  ASSESSMENT/PLAN:   No problem-specific Assessment & Plan notes found for this encounter.     Gerrit Heck, DO [facility]

## 2022-12-01 ENCOUNTER — Ambulatory Visit: Payer: Medicaid Other | Admitting: Dietician

## 2022-12-22 ENCOUNTER — Other Ambulatory Visit: Payer: Self-pay | Admitting: Family Medicine

## 2022-12-22 NOTE — Telephone Encounter (Signed)
He needs to be seen before I will refill.

## 2023-01-05 ENCOUNTER — Other Ambulatory Visit: Payer: Self-pay

## 2023-01-05 MED ORDER — BD PEN NEEDLE SHORT U/F 31G X 8 MM MISC: 0 refills | Status: DC

## 2023-01-18 ENCOUNTER — Ambulatory Visit: Payer: Medicaid Other | Admitting: Podiatry

## 2023-01-27 ENCOUNTER — Ambulatory Visit (HOSPITAL_COMMUNITY)
Admission: EM | Admit: 2023-01-27 | Discharge: 2023-01-27 | Disposition: A | Discharge status: Home / Self Care | Payer: MEDICAID | Attending: Psychiatry | Admitting: Psychiatry

## 2023-01-27 DIAGNOSIS — Z008 Encounter for other general examination: Secondary | ICD-10-CM | POA: Insufficient documentation
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
## 2023-02-05 ENCOUNTER — Other Ambulatory Visit: Payer: Self-pay

## 2023-02-05 DIAGNOSIS — E118 Type 2 diabetes mellitus with unspecified complications: Secondary | ICD-10-CM

## 2023-02-05 MED ORDER — LIRAGLUTIDE 18 MG/3ML ~~LOC~~ SOPN
1.8000 mg | PEN_INJECTOR | Freq: Every day | SUBCUTANEOUS | 0 refills | Status: DC
Start: 2023-02-05 — End: 2023-04-26

## 2023-02-09 ENCOUNTER — Other Ambulatory Visit: Payer: Self-pay | Admitting: Family Medicine

## 2023-02-19 ENCOUNTER — Other Ambulatory Visit: Payer: Self-pay

## 2023-02-19 DIAGNOSIS — E118 Type 2 diabetes mellitus with unspecified complications: Secondary | ICD-10-CM

## 2023-02-20 MED ORDER — INSULIN GLARGINE SOLOSTAR 100 UNIT/ML ~~LOC~~ SOPN: 45.0000 [IU] | PEN_INJECTOR | Freq: Every day | SUBCUTANEOUS | 2 refills | Status: DC

## 2023-03-12 ENCOUNTER — Ambulatory Visit: Payer: MEDICAID

## 2023-03-15 ENCOUNTER — Other Ambulatory Visit: Payer: Self-pay

## 2023-03-17 MED ORDER — LISINOPRIL 40 MG PO TABS: 40.0000 mg | ORAL_TABLET | Freq: Every day | ORAL | 0 refills | Status: DC

## 2023-03-25 ENCOUNTER — Emergency Department (HOSPITAL_BASED_OUTPATIENT_CLINIC_OR_DEPARTMENT_OTHER)
Admission: EM | Admit: 2023-03-25 | Discharge: 2023-03-25 | Disposition: A | Discharge status: Home / Self Care | Payer: MEDICAID | Attending: Emergency Medicine | Admitting: Emergency Medicine

## 2023-03-25 ENCOUNTER — Other Ambulatory Visit: Payer: Self-pay

## 2023-03-25 ENCOUNTER — Encounter (HOSPITAL_BASED_OUTPATIENT_CLINIC_OR_DEPARTMENT_OTHER): Payer: Self-pay

## 2023-03-25 ENCOUNTER — Emergency Department (HOSPITAL_BASED_OUTPATIENT_CLINIC_OR_DEPARTMENT_OTHER): Payer: MEDICAID | Admitting: Radiology

## 2023-03-25 DIAGNOSIS — R079 Chest pain, unspecified: Secondary | ICD-10-CM | POA: Diagnosis present

## 2023-03-25 DIAGNOSIS — I10 Essential (primary) hypertension: Secondary | ICD-10-CM | POA: Insufficient documentation

## 2023-03-25 DIAGNOSIS — Z7984 Long term (current) use of oral hypoglycemic drugs: Secondary | ICD-10-CM | POA: Diagnosis not present

## 2023-03-25 DIAGNOSIS — R0789 Other chest pain: Secondary | ICD-10-CM | POA: Diagnosis not present

## 2023-03-25 DIAGNOSIS — Z794 Long term (current) use of insulin: Secondary | ICD-10-CM | POA: Diagnosis not present

## 2023-03-25 DIAGNOSIS — R1084 Generalized abdominal pain: Secondary | ICD-10-CM | POA: Insufficient documentation

## 2023-03-25 DIAGNOSIS — Z79899 Other long term (current) drug therapy: Secondary | ICD-10-CM | POA: Insufficient documentation

## 2023-03-25 DIAGNOSIS — E119 Type 2 diabetes mellitus without complications: Secondary | ICD-10-CM | POA: Insufficient documentation

## 2023-03-25 LAB — CBC
HCT: 35.4 % — ABNORMAL LOW (ref 39.0–52.0)
Hemoglobin: 12 g/dL — ABNORMAL LOW (ref 13.0–17.0)
MCH: 30.6 pg (ref 26.0–34.0)
MCHC: 33.9 g/dL (ref 30.0–36.0)
MCV: 90.3 fL (ref 80.0–100.0)
Platelets: 383 10*3/uL (ref 150–400)
RBC: 3.92 MIL/uL — ABNORMAL LOW (ref 4.22–5.81)
RDW: 13 % (ref 11.5–15.5)
WBC: 6.3 10*3/uL (ref 4.0–10.5)
nRBC: 0 % (ref 0.0–0.2)

## 2023-03-25 LAB — BASIC METABOLIC PANEL
Anion gap: 6 (ref 5–15)
BUN: 13 mg/dL (ref 6–20)
CO2: 25 mmol/L (ref 22–32)
Calcium: 9.1 mg/dL (ref 8.9–10.3)
Chloride: 106 mmol/L (ref 98–111)
Creatinine, Ser: 1.49 mg/dL — ABNORMAL HIGH (ref 0.61–1.24)
GFR, Estimated: 58 mL/min — ABNORMAL LOW (ref >60)
Glucose, Bld: 142 mg/dL — ABNORMAL HIGH (ref 70–99)
Potassium: 3.9 mmol/L (ref 3.5–5.1)
Sodium: 137 mmol/L (ref 135–145)

## 2023-03-25 LAB — HEPATIC FUNCTION PANEL
ALT: 18 U/L (ref 0–44)
AST: 18 U/L (ref 15–41)
Albumin: 3.3 g/dL — ABNORMAL LOW (ref 3.5–5.0)
Alkaline Phosphatase: 57 U/L (ref 38–126)
Bilirubin, Direct: <0.1 mg/dL (ref 0.0–0.2)
Total Bilirubin: 0.3 mg/dL (ref <1.2)
Total Protein: 7.2 g/dL (ref 6.5–8.1)

## 2023-03-25 LAB — LIPASE, BLOOD: Lipase: 23 U/L (ref 11–51)

## 2023-03-25 LAB — TROPONIN I (HIGH SENSITIVITY)
Troponin I (High Sensitivity): 6 ng/L (ref <18)
Troponin I (High Sensitivity): 5 ng/L (ref <18)

## 2023-03-25 MED ORDER — FAMOTIDINE 20 MG PO TABS: 20.0000 mg | ORAL_TABLET | Freq: Two times a day (BID) | ORAL | 0 refills | Status: DC

## 2023-03-25 NOTE — ED Triage Notes (Signed)
He reports meandering, vague chest and upper abd. Pain x 2 weeks. His skin is normal, warm and dry and he is breathing normally.

## 2023-03-25 NOTE — ED Provider Notes (Signed)
[facility] EMERGENCY DEPARTMENT AT [facility] Provider Note   CSN: [id number] Arrival date & time: 03/25/23  1453     History  Chief Complaint  Patient presents with   Chest Pain    Marcus [name] is a 47 y.o. male.  Patient presents to the emergency department today for evaluation of chest and abdominal pain.  Patient has a history of hypertension, high cholesterol, diabetes, tachycardia.  He reports having short-lived episodes lasting approximately 5-10 seconds, of pains that can occur anywhere throughout the chest or the abdomen.  This has been occurring over the past 2 weeks.  He denies associated vomiting or diaphoresis.  Symptoms are not brought on by activity or by eating or drinking.  No shortness of breath, cough, fevers.  Denies history of GERD or reflux. Patient denies risk factors for pulmonary embolism including: unilateral leg swelling, history of DVT/PE/other blood clots, use of exogenous hormones, recent immobilizations, recent surgery, recent travel (>4hr segment), malignancy, hemoptysis.          Home Medications Prior to Admission medications   Medication Sig Start Date End Date Taking? Authorizing Provider  amLODipine (NORVASC) 10 MG tablet TAKE 1 TABLET(10 MG) BY MOUTH AT BEDTIME 09/21/22   [name], MD  benzonatate (TESSALON PERLES) 100 MG capsule Take 1 capsule (100 mg total) by mouth 3 (three) times daily as needed. 11/05/22   Junie [name], FNP  Blood Glucose Monitoring Suppl (ACCU-CHEK GUIDE) w/Device KIT USE TO TEST BLOOD SUGAR THREE TIMES DAILY WITH MEALS AS NEEDED 07/28/20   [name], MD  cetirizine (ZYRTEC ALLERGY) 10 MG tablet Take 1 tablet (10 mg total) by mouth daily. 11/05/22   Junie [name], FNP  Continuous Blood Gluc Receiver (FREESTYLE LIBRE 2 READER) DEVI Use as directed to check blood sugar continuously 04/20/22   Moses Manners, MD  Continuous Blood Gluc Sensor (FREESTYLE LIBRE 2 SENSOR) MISC Use as directed to check  blood sugar continuously 04/20/22   Moses Manners, MD  empagliflozin (JARDIANCE) 25 MG TABS tablet Take 1 tablet (25 mg total) by mouth daily. 10/03/22   Cora Collum, DO  ezetimibe (ZETIA) 10 MG tablet Take 1 tablet (10 mg total) by mouth daily. Patient not taking: Reported on 04/20/2022 11/01/20   [name], MD  fluticasone [facility]) 50 MCG/ACT nasal spray Place 2 sprays into both nostrils daily. 11/05/22   Jannifer Rodney A, FNP  glucose blood test strip Please use to check sugars before each meal and prior to bedtime. 02/14/19   Myrene Buddy, MD  insulin aspart (NOVOLOG FLEXPEN) 100 UNIT/ML FlexPen Inject 5 Units into the skin daily. Prior to largest meal. 09/13/22   [name], MD  Insulin Glargine Solostar (LANTUS) 100 UNIT/ML Solostar Pen Inject 45 Units as directed daily. 02/20/23   Gerrit Heck, DO  Insulin Pen Needle (B-D ULTRAFINE III SHORT PEN) 31G X 8 MM MISC USE AS DIRECTED TO INJECT INSULIN 01/05/23   Gerrit Heck, DO  Lancets Misc. (ACCU-CHEK FASTCLIX LANCET) KIT 1 Units by Does not apply route as needed. 09/03/17   Myrene Buddy, MD  liraglutide (VICTOZA) 18 MG/3ML SOPN Inject 1.8 mg into the skin daily. 02/05/23   McDiarmid, Leighton Roach, MD  lisinopril (ZESTRIL) 40 MG tablet Take 1 tablet (40 mg total) by mouth daily. 03/17/23 04/16/23  Fortunato Curling, DO  metFORMIN (GLUCOPHAGE) 1000 MG tablet Take 1 tablet (1,000 mg total) by mouth 2 (two) times daily with a meal. TAKE 1 TABLET(1000 MG) BY MOUTH  TWICE DAILY WITH A MEAL Strength: 1,000 mg 07/13/22   [name], MD  Multiple Vitamin (MULTIVITAMIN ADULT PO) Take 1 tablet by mouth daily.    [provider]  rosuvastatin (CRESTOR) 40 MG tablet Take 1 tablet (40 mg total) by mouth daily. Patient not taking: Reported on 04/20/2022 03/21/22   [name], MD  tiZANidine (ZANAFLEX) 4 MG tablet TAKE 1 TABLET(4 MG) BY MOUTH AT BEDTIME AS NEEDED FOR MUSCLE SPASMS. Need physician appt before further refills 11/13/22   Billey Co, MD  vitamin B-12 (CYANOCOBALAMIN) 100 MCG tablet Take 100 mcg by mouth daily as needed.    [provider]  zinc gluconate 50 MG tablet Take 50 mg by mouth daily.    [provider]      Allergies    Ivp dye [iodinated contrast media], Shellfish allergy, Shellfish-derived products, and Vicodin [hydrocodone-acetaminophen]    Review of Systems   Review of Systems  Physical Exam Updated Vital Signs BP (!) 160/93   Pulse (!) 111   Temp 98.1 F (36.7 C) (Oral)   Resp (!) 23   SpO2 100%  Physical Exam Vitals and nursing note reviewed.  Constitutional:      Appearance: He is well-developed. He is not diaphoretic.  HENT:     Head: Normocephalic and atraumatic.     Mouth/Throat:     Mouth: Mucous membranes are not dry.  Eyes:     Conjunctiva/sclera: Conjunctivae normal.  Neck:     Vascular: Normal carotid pulses. No carotid bruit or JVD.     Trachea: Trachea normal. No tracheal deviation.  Cardiovascular:     Rate and Rhythm: Normal rate and regular rhythm.     Pulses: No decreased pulses.          Radial pulses are 2+ on the right side and 2+ on the left side.     Heart sounds: Normal heart sounds, S1 normal and S2 normal. Heart sounds not distant. No murmur heard. Pulmonary:     Effort: Pulmonary effort is normal. No respiratory distress.     Breath sounds: Normal breath sounds. No wheezing, rhonchi or rales.  Chest:     Chest wall: No tenderness.  Abdominal:     General: Bowel sounds are normal.     Palpations: Abdomen is soft.     Tenderness: There is no abdominal tenderness. There is no guarding or rebound.  Musculoskeletal:     Cervical back: Normal range of motion and neck supple. No muscular tenderness.     Right lower leg: No edema.     Left lower leg: No edema.  Skin:    General: Skin is warm and dry.     Coloration: Skin is not pale.  Neurological:     Mental Status: He is alert. Mental status is at baseline.  Psychiatric:         Mood and Affect: Mood normal.     ED Results / Procedures / Treatments   Labs (all labs ordered are listed, but only abnormal results are displayed) Labs Reviewed  BASIC METABOLIC PANEL - Abnormal; Notable for the following components:      Result Value   Glucose, Bld 142 (*)    Creatinine, Ser 1.49 (*)    GFR, Estimated 58 (*)    All other components within normal limits  CBC - Abnormal; Notable for the following components:   RBC 3.92 (*)    Hemoglobin 12.0 (*)    HCT 35.4 (*)  All other components within normal limits  HEPATIC FUNCTION PANEL - Abnormal; Notable for the following components:   Albumin 3.3 (*)    All other components within normal limits  LIPASE, BLOOD  TROPONIN I (HIGH SENSITIVITY)  TROPONIN I (HIGH SENSITIVITY)    ED ECG REPORT   Date: 03/25/2023  Rate: 111  Rhythm: sinus tachycardia  QRS Axis: normal  Intervals: normal  ST/T Wave abnormalities: normal  Conduction Disutrbances:nonspecific intraventricular conduction delay  Narrative Interpretation:   Old EKG Reviewed: unchanged, tachycardia 100-110 noted on previous EKGs  I have personally reviewed the EKG tracing and agree with the computerized printout as noted.   Radiology DG Chest 2 View  Result Date: 03/25/2023 CLINICAL DATA:  Chest pain EXAM: CHEST - 2 VIEW COMPARISON:  Chest x-ray 07/07/2021 FINDINGS: The heart size and mediastinal contours are within normal limits. Both lungs are clear. The visualized skeletal structures are unremarkable. IMPRESSION: No active cardiopulmonary disease. Electronically Signed   By: Darliss Cheney M.D.   On: 03/25/2023 16:22    Procedures Procedures    Medications Ordered in ED Medications - No data to display  ED Course/ Medical Decision Making/ A&P    Patient seen and examined. History obtained directly from patient. Work-up including labs, imaging, EKG ordered in triage, if performed, were reviewed.  Reviewed previous ED workup including VQ scan  performed February 2023 which was negative.  Labs/EKG: Independently reviewed and interpreted.  This included: CBC anemia noted but at patient's baseline; CMP with elevated creatinine at baseline, normal electrolytes, glucose 142 with normal anion gap of 6; first troponin normal at 6.  Added hepatic function panel and lipase, awaiting second troponin.  Imaging: Independently visualized and interpreted.  This included: Chest x-ray, agree negative.  Medications/Fluids: None ordered  Most recent vital signs reviewed and are as follows: BP (!) 160/93   Pulse (!) 111   Temp 98.1 F (36.7 C) (Oral)   Resp (!) 23   SpO2 100%   Initial impression: Atypical chest and abdominal pain.  Sinus tachycardia consistent with previous.  No clinical features of DVT.  Overall low concern for PE.  No active chest pain or shortness of breath.  Patient is not hypoxic.  Chest x-ray is clear.  Awaiting remainder of workup.  6:33 PM Reassessment performed. Patient appears stable, comfortable.  Heart rate noted to be improved into the 90s.  Labs personally reviewed and interpreted including: Troponin 6 >> 5; hepatic function unremarkable; lipase normal.  Reviewed pertinent lab work and imaging with patient at bedside. Questions answered.   Most current vital signs reviewed and are as follows: BP 103/79   Pulse 98   Temp 98.1 F (36.7 C) (Oral)   Resp 16   Ht 5\' 7"  (1.702 m)   Wt 124 kg   SpO2 96%   BMI 42.82 kg/m   Plan: Discharge to home.   Prescriptions written for: Pepcid  Other home care instructions discussed: Trial Pepcid, bland diet.  Return and follow-up instructions: I encouraged patient to return to ED with severe chest pain, especially if the pain is crushing or pressure-like and spreads to the arms, back, neck, or jaw, or if they have associated sweating, vomiting, or shortness of breath with the pain, or significant pain with activity. We discussed that the evaluation here today indicates  a low-risk of serious cause of chest pain, including heart trouble or a blood clot, but no evaluation is perfect and chest pain can evolve with time. The patient  verbalized understanding and agreed.  I encouraged patient to follow-up with their provider in the next 1 week for recheck.                                     Medical Decision Making Amount and/or Complexity of Data Reviewed Labs: ordered. Radiology: ordered.   For this patient's complaint of chest pain, the following emergent conditions were considered on the differential diagnosis: acute coronary syndrome, pulmonary embolism, pneumothorax, myocarditis, pericardial tamponade, aortic dissection, thoracic aortic aneurysm complication, esophageal perforation.   Other causes were also considered including: gastroesophageal reflux disease, musculoskeletal pain including costochondritis, pneumonia/pleurisy, herpes zoster, pericarditis.  In regards to possibility of ACS, patient has very atypical features of pain, non-ischemic and unchanged EKG and negative troponin(s). Heart score was calculated to be 3.   In regards to possibility of PE, symptoms are atypical for PE and risk profile is low, making PE low likelihood.   For this patient's complaint of abdominal pain, the following conditions were considered on the differential diagnosis: gastritis/PUD, enteritis/duodenitis, appendicitis, cholelithiasis/cholecystitis, cholangitis, pancreatitis, ruptured viscus, colitis, diverticulitis, small/large bowel obstruction, proctitis, cystitis, pyelonephritis, ureteral colic, aortic dissection, aortic aneurysm. Atypical chest etiologies were also considered including ACS, PE, and pneumonia.  The patient's vital signs, pertinent lab work and imaging were reviewed and interpreted as discussed in the ED course. Hospitalization was considered for further testing, treatments, or serial exams/observation. However as patient is well-appearing, has a  stable exam, and reassuring studies today, I do not feel that they warrant admission at this time. This plan was discussed with the patient who verbalizes agreement and comfort with this plan and seems reliable and able to return to the Emergency Department with worsening or changing symptoms.          Final Clinical Impression(s) / ED Diagnoses Final diagnoses:  Chest pain, unspecified type  Generalized abdominal pain    Rx / DC Orders ED Discharge Orders          Ordered    famotidine (PEPCID) 20 MG tablet  2 times daily        03/25/23 1831              Renne Crigler, PA-C 03/25/23 1835    Gwyneth Sprout, MD 03/26/23 0106

## 2023-03-25 NOTE — Discharge Instructions (Signed)
Please read and follow all provided instructions.  Your diagnoses today include:  1. Chest pain, unspecified type   2. Generalized abdominal pain     Tests performed today include: An EKG of your heart: Shows slightly fast heart rate but no concerning findings A chest x-ray: Was clear Cardiac enzymes - a blood test for heart muscle damage are both normal Blood counts and electrolytes Liver and pancreas function: Were normal Vital signs. See below for your results today.   Medications prescribed:  Pepcid (famotidine) - antihistamine  You can find this medication over-the-counter.   DO NOT exceed:  20mg  Pepcid every 12 hours  Take any prescribed medications only as directed.  Follow-up instructions: Please follow-up with your primary care provider in the next 1 week for further evaluation of your symptoms  Return instructions:  SEEK IMMEDIATE MEDICAL ATTENTION IF: You have severe chest pain, especially if the pain is crushing or pressure-like and spreads to the arms, back, neck, or jaw, or if you have sweating, nausea or vomiting, or trouble with breathing. THIS IS AN EMERGENCY. Do not wait to see if the pain will go away. Get medical help at once. Call 911. DO NOT drive yourself to the hospital.  Your chest pain gets worse and does not go away after a few minutes of rest.  You have an attack of chest pain lasting longer than what you usually experience.  You have significant dizziness, if you pass out, or have trouble walking.  You have chest pain not typical of your usual pain for which you originally saw your caregiver.  You have any other emergent concerns regarding your health.  Additional Information: Chest pain comes from many different causes. Your caregiver has diagnosed you as having chest pain that is not specific for one problem, but does not require admission.  You are at low risk for an acute heart condition or other serious illness.   Your vital signs today  were: BP 103/79   Pulse 98   Temp 98.1 F (36.7 C) (Oral)   Resp 16   Ht 5\' 7"  (1.702 m)   Wt 124 kg   SpO2 96%   BMI 42.82 kg/m  If your blood pressure (BP) was elevated above 135/85 this visit, please have this repeated by your doctor within one month. --------------

## 2023-03-29 ENCOUNTER — Ambulatory Visit: Payer: MEDICAID | Admitting: Family Medicine

## 2023-03-29 NOTE — Telephone Encounter (Signed)
Patient has appt today with PCP. [name], CMA

## 2023-03-29 NOTE — Progress Notes (Deleted)
    SUBJECTIVE:   CHIEF COMPLAINT / HPI:   A1c check, has not been seen for health maintenance in a while Had labs done in the ED on 11/10- creatinine is elevated, EGFR 58, Glucose 142  PERTINENT  PMH / PSH: ***  OBJECTIVE:   There were no vitals taken for this visit.  ***  ASSESSMENT/PLAN:   No problem-specific Assessment & Plan notes found for this encounter.     Gerrit Heck, DO [facility]

## 2023-04-02 ENCOUNTER — Ambulatory Visit (INDEPENDENT_AMBULATORY_CARE_PROVIDER_SITE_OTHER): Payer: MEDICAID | Admitting: Family Medicine

## 2023-04-02 VITALS — BP 171/109 | HR 99 | Ht 67.0 in | Wt 272.8 lb

## 2023-04-02 DIAGNOSIS — I152 Hypertension secondary to endocrine disorders: Secondary | ICD-10-CM

## 2023-04-02 DIAGNOSIS — E1159 Type 2 diabetes mellitus with other circulatory complications: Secondary | ICD-10-CM

## 2023-04-02 DIAGNOSIS — R079 Chest pain, unspecified: Secondary | ICD-10-CM

## 2023-04-02 DIAGNOSIS — E118 Type 2 diabetes mellitus with unspecified complications: Secondary | ICD-10-CM

## 2023-04-02 LAB — POCT GLYCOSYLATED HEMOGLOBIN (HGB A1C): HbA1c, POC (controlled diabetic range): 7.6 % — AB (ref 0.0–7.0)

## 2023-04-02 MED ORDER — HYDROCHLOROTHIAZIDE 25 MG PO TABS: 25.0000 mg | ORAL_TABLET | Freq: Every day | ORAL | 3 refills | Status: DC

## 2023-04-02 MED ORDER — LISINOPRIL 40 MG PO TABS
40.0000 mg | ORAL_TABLET | Freq: Every day | ORAL | 0 refills | Status: DC
Start: 2023-04-02 — End: 2023-06-05

## 2023-04-02 MED ORDER — PANTOPRAZOLE SODIUM 40 MG PO TBEC: 40.0000 mg | DELAYED_RELEASE_TABLET | Freq: Every day | ORAL | 0 refills | Status: DC

## 2023-04-02 MED ORDER — EMPAGLIFLOZIN 25 MG PO TABS: 25.0000 mg | ORAL_TABLET | Freq: Every day | ORAL | 3 refills | Status: DC

## 2023-04-02 MED ORDER — LISINOPRIL 40 MG PO TABS
40.0000 mg | ORAL_TABLET | Freq: Every day | ORAL | 0 refills | Status: DC
Start: 2023-04-02 — End: 2023-04-02

## 2023-04-02 MED ORDER — AMLODIPINE BESYLATE 10 MG PO TABS: 10.0000 mg | ORAL_TABLET | Freq: Every day | ORAL | 1 refills | Status: DC

## 2023-04-02 NOTE — Patient Instructions (Addendum)
It was wonderful to see you today.  Please bring ALL of your medications with you to every visit.   Today we talked about:  Chest pain - Your chest pain could be due to a heart cause or due to reflux disease (acid reflux/ GERD) that is made worse from constipation. Think of it as a pipe getting backed up. I want you to take miralax powder 1 cap a day. Additionally I am going to prescribe protonix for 8 weeks. You can stop taking the pepcid (famotidine). IF you have worsening chest pain, get short of breath or light headed please go to the emergency room. I will also refer you to cardiology to get a stress test.   Blood pressure - I had to add another blood pressure medication since you are on the max dose of your other medications. This medication is called hydrochlorothiazide. Please follow up with your PCP for a visit in the next 4 weeks to recheck labs and blood pressure.  Thank you for choosing [facility].   Please call [phone] with any questions about today's appointment.  Please be sure to schedule follow up at the front desk before you leave today.   Lockie Mola, MD  Family Medicine

## 2023-04-02 NOTE — Assessment & Plan Note (Signed)
Uncontrolled htn despite max dose amlodipine and lisinopril.  - Start hydrochlorothiazide 25 mg  - BMP in 2 weeks.

## 2023-04-02 NOTE — Assessment & Plan Note (Signed)
Patient has been on insulin and titrating per CGM  - A1c  - Foot exam with podiatry this week  - Ophthalmology exam today

## 2023-04-02 NOTE — Progress Notes (Signed)
    SUBJECTIVE:   CHIEF COMPLAINT / HPI:   Chest Pain  Patient reports intermittent chest pain for the last 2 weeks. He mentions he had this issue about 8 months ago. He has been to the ED thrice without any remarkable or acute findings. Troponin was unremarkable.  Pain radiates from left chest, epigastric, right chest. Pain lasts an hour per episode, dull pain. Episodes are a couple times a week.  Triggered by eating and exercise. No pain when resting. Denies palpitations.  No shortness of breath. No leg swelling or pain.   Hypertension Patient did not take blood pressure medication today. Last took medicine last night.  Patient endorses headache for the last couple weeks and feels fatigue but no neurologic deficits. No vision changes. He takes his blood pressure medication every day otherwise and has regular access to medication.   Health Maintenance  He is going to have his ophthalmology exam today and is going to the podiatry center this week.   PERTINENT  PMH / PSH: HTN, T2DM, GERD, CKD3, Bipolar, FactorV leiden   OBJECTIVE:   BP (!) 171/109   Pulse 99   Ht 5\' 7"  (1.702 m)   Wt 272 lb 12.8 oz (123.7 kg)   SpO2 100%   BMI 42.73 kg/m   General: well appearing, in no acute distress CV: RRR, radial pulses equal and palpable, no BLE edema  Resp: Normal work of breathing on room air, CTAB Abd: Protuberant, distended, nontender to palpation Neuro: Alert & Oriented x 4    ASSESSMENT/PLAN:   Assessment & Plan Chest pain, unspecified type Unlikely ACS given history and exam. Was recently evaluated in ED. Chest pain could be due to angina or GERD as symptoms improved with pepcid. However, patient is quite high risk and should have a stress test. Labs and tests from ED visit were unremarkable.  - Referral to cardiology  - Protonix 40 mg daily for 8 week course  - Discontinue pepcid  - Optimize risk factors as below.  Type 2 diabetes mellitus with complication (HCC) Patient has  been on insulin and titrating per CGM  - A1c  - Foot exam with podiatry this week  - Ophthalmology exam today  Hypertension associated with diabetes (HCC) Uncontrolled htn despite max dose amlodipine and lisinopril.  - Start hydrochlorothiazide 25 mg  - BMP in 2 weeks.      Lockie Mola, MD [facility]

## 2023-04-03 LAB — CBC WITH DIFFERENTIAL/PLATELET
Basophils Absolute: 0.1 10*3/uL (ref 0.0–0.2)
Basos: 1 %
EOS (ABSOLUTE): 0.2 10*3/uL (ref 0.0–0.4)
Eos: 5 %
Hematocrit: 37.5 % (ref 37.5–51.0)
Hemoglobin: 12.8 g/dL — ABNORMAL LOW (ref 13.0–17.7)
Immature Grans (Abs): 0 10*3/uL (ref 0.0–0.1)
Immature Granulocytes: 0 %
Lymphocytes Absolute: 1.6 10*3/uL (ref 0.7–3.1)
Lymphs: 33 %
MCH: 30.7 pg (ref 26.6–33.0)
MCHC: 34.1 g/dL (ref 31.5–35.7)
MCV: 90 fL (ref 79–97)
Monocytes Absolute: 0.5 10*3/uL (ref 0.1–0.9)
Monocytes: 10 %
Neutrophils Absolute: 2.5 10*3/uL (ref 1.4–7.0)
Neutrophils: 51 %
Platelets: 440 10*3/uL (ref 150–450)
RBC: 4.17 x10E6/uL (ref 4.14–5.80)
RDW: 12.3 % (ref 11.6–15.4)
WBC: 4.9 10*3/uL (ref 3.4–10.8)

## 2023-04-03 LAB — BASIC METABOLIC PANEL
BUN/Creatinine Ratio: 11 (ref 9–20)
BUN: 14 mg/dL (ref 6–24)
CO2: 22 mmol/L (ref 20–29)
Calcium: 9.6 mg/dL (ref 8.7–10.2)
Chloride: 105 mmol/L (ref 96–106)
Creatinine, Ser: 1.26 mg/dL (ref 0.76–1.27)
Glucose: 71 mg/dL (ref 70–99)
Potassium: 4.7 mmol/L (ref 3.5–5.2)
Sodium: 142 mmol/L (ref 134–144)
eGFR: 70 mL/min/{1.73_m2} (ref >59)

## 2023-04-03 LAB — TSH RFX ON ABNORMAL TO FREE T4: TSH: 3.72 u[IU]/mL (ref 0.450–4.500)

## 2023-04-03 LAB — MAGNESIUM: Magnesium: 1.9 mg/dL (ref 1.6–2.3)

## 2023-04-05 ENCOUNTER — Encounter: Payer: Self-pay | Admitting: Podiatry

## 2023-04-05 ENCOUNTER — Ambulatory Visit (INDEPENDENT_AMBULATORY_CARE_PROVIDER_SITE_OTHER): Payer: MEDICAID | Admitting: Podiatry

## 2023-04-05 VITALS — Ht 67.0 in | Wt 272.8 lb

## 2023-04-05 DIAGNOSIS — M79676 Pain in unspecified toe(s): Secondary | ICD-10-CM | POA: Diagnosis not present

## 2023-04-05 DIAGNOSIS — E1140 Type 2 diabetes mellitus with diabetic neuropathy, unspecified: Secondary | ICD-10-CM | POA: Diagnosis not present

## 2023-04-05 DIAGNOSIS — B351 Tinea unguium: Secondary | ICD-10-CM

## 2023-04-05 DIAGNOSIS — E1149 Type 2 diabetes mellitus with other diabetic neurological complication: Secondary | ICD-10-CM

## 2023-04-05 NOTE — Progress Notes (Signed)
Complaint:  Visit Type: Patient returns to my office for continued preventative foot care services. Complaint: Patient states" my nails have grown long and thick and become painful to walk and wear shoes" Patient has been diagnosed with DM with no foot complications. The patient presents for preventative foot care services. No changes to ROS  Podiatric Exam: Vascular: dorsalis pedis and posterior tibial pulses are palpable bilateral. Capillary return is immediate. Temperature gradient is WNL. Skin turgor WNL  Sensorium: Normal Semmes Weinstein monofilament test. Normal tactile sensation bilaterally. Nail Exam: Pt has thick disfigured discolored nails with subungual debris noted bilateral entire nail hallux through fifth toenails Ulcer Exam: There is no evidence of ulcer or pre-ulcerative changes or infection. Orthopedic Exam: Muscle tone and strength are WNL. No limitations in general ROM. No crepitus or effusions noted. Foot type and digits show no abnormalities. Bony prominences are unremarkable. Skin: No Porokeratosis. No infection or ulcers  Diagnosis:  Onychomycosis, , Pain in right toe, pain in left toes  Treatment & Plan Procedures and Treatment: Consent by patient was obtained for treatment procedures.   Debridement of mycotic and hypertrophic toenails, 1 through 5 bilateral and clearing of subungual debris using a nail nipper and then dremel tool. . No ulceration, no infection noted.  Return Visit-Office Procedure: Patient instructed to return to the office for a follow up visit 4 months for continued evaluation and treatment.    Eisha Chatterjee DPM 

## 2023-04-08 ENCOUNTER — Encounter: Payer: Self-pay | Admitting: Family Medicine

## 2023-04-15 ENCOUNTER — Telehealth: Payer: MEDICAID | Admitting: Physician Assistant

## 2023-04-15 DIAGNOSIS — M545 Low back pain, unspecified: Secondary | ICD-10-CM

## 2023-04-15 MED ORDER — NAPROXEN 500 MG PO TABS: 500.0000 mg | ORAL_TABLET | Freq: Two times a day (BID) | ORAL | 0 refills | Status: DC

## 2023-04-15 MED ORDER — BACLOFEN 10 MG PO TABS: 10.0000 mg | ORAL_TABLET | Freq: Three times a day (TID) | ORAL | 0 refills | Status: DC

## 2023-04-15 NOTE — Progress Notes (Signed)
E-Visit for Back Pain   We are sorry that you are not feeling well.  Here is how we plan to help!  Based on what you have shared with me it looks like you mostly have acute back pain.  Acute back pain is defined as musculoskeletal pain that can resolve in 1-3 weeks with conservative treatment.  I have prescribed Naprosyn 500 mg take one by mouth twice a day non-steroid anti-inflammatory (NSAID) as well as Baclofen 10 mg every eight hours as needed which is a muscle relaxer  Some patients experience stomach irritation or in increased heartburn with anti-inflammatory drugs.  Please keep in mind that muscle relaxer's can cause fatigue and should not be taken while at work or driving.  Back pain is very common.  The pain often gets better over time.  The cause of back pain is usually not dangerous.  Most people can learn to manage their back pain on their own.  Home Care Stay active.  Start with short walks on flat ground if you can.  Try to walk farther each day. Do not sit, drive or stand in one place for more than 30 minutes.  Do not stay in bed. Do not avoid exercise or work.  Activity can help your back heal faster. Be careful when you bend or lift an object.  Bend at your knees, keep the object close to you, and do not twist. Sleep on a firm mattress.  Lie on your side, and bend your knees.  If you lie on your back, put a pillow under your knees. Only take medicines as told by your doctor. Put ice on the injured area. Put ice in a plastic bag Place a towel between your skin and the bag Leave the ice on for 15-20 minutes, 3-4 times a day for the first 2-3 days. 210 After that, you can switch between ice and heat packs. Ask your doctor about back exercises or massage. Avoid feeling anxious or stressed.  Find good ways to deal with stress, such as exercise.  Get Help Right Way If: Your pain does not go away with rest or medicine. Your pain does not go away in 1 week. You have new  problems. You do not feel well. The pain spreads into your legs. You cannot control when you poop (bowel movement) or pee (urinate) You feel sick to your stomach (nauseous) or throw up (vomit) You have belly (abdominal) pain. You feel like you may pass out (faint). If you develop a fever.  Make Sure you: Understand these instructions. Will watch your condition Will get help right away if you are not doing well or get worse.  Your e-visit answers were reviewed by a board certified advanced clinical practitioner to complete your personal care plan.  Depending on the condition, your plan could have included both over the counter or prescription medications.  If there is a problem please reply  once you have received a response from your provider.  Your safety is important to [place].  If you have drug allergies check your prescription carefully.    You can use MyChart to ask questions about today's visit, request a non-urgent call back, or ask for a work or school excuse for 24 hours related to this e-Visit. If it has been greater than 24 hours you will need to follow up with your provider, or enter a new e-Visit to address those concerns.  You will get an e-mail in the next two days asking about  your experience.  I hope that your e-visit has been valuable and will speed your recovery. Thank you for using e-visits.   Approximately 5 minutes was spent documenting and reviewing patient's chart.

## 2023-04-22 ENCOUNTER — Encounter: Payer: Self-pay | Admitting: Family Medicine

## 2023-04-22 ENCOUNTER — Other Ambulatory Visit: Payer: Self-pay | Admitting: Family Medicine

## 2023-04-22 DIAGNOSIS — E1169 Type 2 diabetes mellitus with other specified complication: Secondary | ICD-10-CM

## 2023-04-22 DIAGNOSIS — E118 Type 2 diabetes mellitus with unspecified complications: Secondary | ICD-10-CM

## 2023-04-25 ENCOUNTER — Other Ambulatory Visit: Payer: Self-pay

## 2023-04-25 ENCOUNTER — Ambulatory Visit (INDEPENDENT_AMBULATORY_CARE_PROVIDER_SITE_OTHER): Payer: MEDICAID | Admitting: Allergy

## 2023-04-25 ENCOUNTER — Encounter: Payer: Self-pay | Admitting: Allergy

## 2023-04-25 VITALS — BP 168/96 | HR 115 | Temp 98.2°F | Resp 18 | Ht 65.35 in | Wt 258.1 lb

## 2023-04-25 DIAGNOSIS — T7800XD Anaphylactic reaction due to unspecified food, subsequent encounter: Secondary | ICD-10-CM | POA: Diagnosis not present

## 2023-04-25 DIAGNOSIS — L308 Other specified dermatitis: Secondary | ICD-10-CM | POA: Diagnosis not present

## 2023-04-25 DIAGNOSIS — L299 Pruritus, unspecified: Secondary | ICD-10-CM | POA: Diagnosis not present

## 2023-04-25 DIAGNOSIS — Z888 Allergy status to other drugs, medicaments and biological substances status: Secondary | ICD-10-CM

## 2023-04-25 DIAGNOSIS — T508X5D Adverse effect of diagnostic agents, subsequent encounter: Secondary | ICD-10-CM

## 2023-04-25 MED ORDER — TRIAMCINOLONE ACETONIDE 0.5 % EX OINT: 1.0000 | TOPICAL_OINTMENT | Freq: Two times a day (BID) | CUTANEOUS | 3 refills | Status: DC | PRN

## 2023-04-25 MED ORDER — PIMECROLIMUS 1 % EX CREA: TOPICAL_CREAM | Freq: Two times a day (BID) | CUTANEOUS | 2 refills | Status: DC | PRN

## 2023-04-25 MED ORDER — EPINEPHRINE 0.3 MG/0.3ML IJ SOAJ: 0.3000 mg | INTRAMUSCULAR | 2 refills | Status: DC | PRN

## 2023-04-25 NOTE — Patient Instructions (Signed)
Chronic Pruritus and Rash Long-standing issue of severe itching, predominantly on the back, with hyperpigmentation and scabbing. Possible contact or environmental allergy. -Order allergy testing via blood work to assess for environmental allergies. -Bathe and soak for 5-10 minutes in warm water once a day. Pat dry.  Immediately apply the below cream prescribed to flared areas (red, irritated, dry, itchy, patchy, scaly, flaky) only. Wait several minutes and then apply your moisturizer all over.   To affected areas on the face and neck, apply: Elidel 1% ointment twice a day as needed. Be careful to avoid the eyes. To affected areas on the body (below the face and neck), apply: Triamcinolone 0.1 % ointment twice a day as needed. With ointments be careful to avoid the armpits and groin area. -Consider Dupixent as a long-term treatment option if topical treatments are ineffective. -Consider patch testing to evaluate for contact dermatitis.  Recommend performing patch testing with the TRUE test patch panels.  Patches are best placed on a Monday with return to office on Wednesday and Friday of same week for readings.  Once patches are in place to do not get them wet.  You can take antihistamines while patches are in place.   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 -Include nuts in allergy blood work panel. -Continue avoidance of nuts in diet -Have access to self-injectable epinephrine (Epipen or AuviQ) 0.3mg  at all times -Follow emergency action plan in case of allergic reaction  Contrast Dye Allergy Reported itching after exposure to contrast dye used in imaging studies. -Continue protocol for reaction to contrast dye  Follow-up in 3-4 months or sooner if needed
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
## 2023-04-26 ENCOUNTER — Ambulatory Visit (INDEPENDENT_AMBULATORY_CARE_PROVIDER_SITE_OTHER): Payer: MEDICAID | Admitting: Family Medicine

## 2023-04-26 ENCOUNTER — Other Ambulatory Visit: Payer: Self-pay | Admitting: Family Medicine

## 2023-04-26 DIAGNOSIS — E1159 Type 2 diabetes mellitus with other circulatory complications: Secondary | ICD-10-CM

## 2023-04-26 DIAGNOSIS — I152 Hypertension secondary to endocrine disorders: Secondary | ICD-10-CM | POA: Diagnosis not present

## 2023-04-26 DIAGNOSIS — E781 Pure hyperglyceridemia: Secondary | ICD-10-CM | POA: Diagnosis not present

## 2023-04-26 DIAGNOSIS — E118 Type 2 diabetes mellitus with unspecified complications: Secondary | ICD-10-CM | POA: Diagnosis not present

## 2023-04-26 MED ORDER — LIRAGLUTIDE 18 MG/3ML ~~LOC~~ SOPN: 1.8000 mg | PEN_INJECTOR | Freq: Every day | SUBCUTANEOUS | 0 refills | Status: DC

## 2023-04-26 NOTE — Assessment & Plan Note (Signed)
Patient tolerating current regimen well.  Refilled Victoza 1.8 mg daily.  Will consider switching to GLP-1 with greater than or weight loss effect at future visits.
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
## 2023-04-26 NOTE — Patient Instructions (Signed)
It was wonderful to see you today!  We followed up on your blood pressure today, and we checked your electrolytes and your lipids.  If anything comes back abnormal I will give you a phone call, otherwise we will discuss your results and any changes we need to make at your next visit.  I would like to see you back in about 1 month.  Please call [phone] with any questions about today's appointment.   If you need any additional refills, please call your pharmacy before calling the office.  Gerrit Heck, DO Family Medicine

## 2023-04-26 NOTE — Assessment & Plan Note (Signed)
His recent A1c was 7.6.  The blood pressure was at goal, diastolic BP slightly elevated.  We will check a BMP and lipid panel as ordered by Dr. Marsh Dolly at previous visit and plan to follow-up with patient in 1 month.

## 2023-04-27 ENCOUNTER — Other Ambulatory Visit (HOSPITAL_COMMUNITY): Payer: Self-pay

## 2023-04-27 ENCOUNTER — Other Ambulatory Visit: Payer: Self-pay | Admitting: Family

## 2023-04-27 ENCOUNTER — Telehealth: Payer: Self-pay

## 2023-04-27 DIAGNOSIS — M545 Low back pain, unspecified: Secondary | ICD-10-CM

## 2023-04-27 LAB — IGE NUT PROF. W/COMPONENT RFLX

## 2023-04-27 LAB — BASIC METABOLIC PANEL
BUN/Creatinine Ratio: 11 (ref 9–20)
BUN: 22 mg/dL (ref 6–24)
CO2: 23 mmol/L (ref 20–29)
Calcium: 9.5 mg/dL (ref 8.7–10.2)
Chloride: 91 mmol/L — ABNORMAL LOW (ref 96–106)
Creatinine, Ser: 1.92 mg/dL — ABNORMAL HIGH (ref 0.76–1.27)
Glucose: 386 mg/dL — ABNORMAL HIGH (ref 70–99)
Potassium: 4.3 mmol/L (ref 3.5–5.2)
Sodium: 134 mmol/L (ref 134–144)
eGFR: 42 mL/min/{1.73_m2} — ABNORMAL LOW (ref >59)

## 2023-04-27 LAB — TRIGLYCERIDES: Triglycerides: 693 mg/dL (ref 0–149)

## 2023-04-27 NOTE — Telephone Encounter (Signed)
Pharmacy Patient Advocate Encounter   Received notification from CoverMyMeds that prior authorization for Pimecrolimus 1% cream is required/requested.   Insurance verification completed.   The patient is insured through [facility] .   Per test claim: PA required; PA submitted to above mentioned insurance via CoverMyMeds Key/confirmation #/EOC [id number] Status is pending

## 2023-04-30 LAB — ALLERGENS W/TOTAL IGE AREA 2
Alternaria Alternata IgE: <0.1 kU/L
Aspergillus Fumigatus IgE: <0.1 kU/L
Bermuda Grass IgE: <0.1 kU/L
Cat Dander IgE: 20.1 kU/L — AB
Cedar, Mountain IgE: <0.1 kU/L
Cladosporium Herbarum IgE: <0.1 kU/L
Cockroach, German IgE: 0.6 kU/L — AB
Common Silver Birch IgE: <0.1 kU/L
Cottonwood IgE: 0.1 kU/L — AB
D Farinae IgE: 0.38 kU/L — AB
D Pteronyssinus IgE: 0.43 kU/L — AB
Dog Dander IgE: 5.71 kU/L — AB
Elm, American IgE: <0.1 kU/L
IgE (Immunoglobulin E), Serum: 792 [IU]/mL — ABNORMAL HIGH (ref 6–495)
Johnson Grass IgE: <0.1 kU/L
Maple/Box Elder IgE: <0.1 kU/L
Mouse Urine IgE: <0.1 kU/L
Oak, White IgE: <0.1 kU/L
Pecan, Hickory IgE: <0.1 kU/L
Penicillium Chrysogen IgE: <0.1 kU/L
Pigweed, Rough IgE: <0.1 kU/L
Ragweed, Short IgE: <0.1 kU/L
Sheep Sorrel IgE Qn: <0.1 kU/L
Timothy Grass IgE: <0.1 kU/L
White Mulberry IgE: <0.1 kU/L

## 2023-04-30 LAB — PEANUT COMPONENTS
F352-IgE Ara h 8: <0.1 kU/L
F422-IgE Ara h 1: <0.1 kU/L
F423-IgE Ara h 2: <0.1 kU/L
F424-IgE Ara h 3: <0.1 kU/L
F427-IgE Ara h 9: 0.54 kU/L — AB
F447-IgE Ara h 6: <0.1 kU/L

## 2023-04-30 LAB — PANEL 604721
Jug R 1 IgE: <0.1 kU/L
Jug R 3 IgE: 0.36 kU/L — AB

## 2023-04-30 LAB — IGE NUT PROF. W/COMPONENT RFLX
F256-IgE Walnut: 0.2 kU/L — AB
Peanut, IgE: 0.44 kU/L — AB

## 2023-04-30 LAB — ALLERGEN COMPONENT COMMENTS

## 2023-04-30 NOTE — Telephone Encounter (Signed)
Pharmacy Patient Advocate Encounter  Received notification from [facility] that Prior Authorization for [id number] has been DENIED.  Full denial letter will be uploaded to the media tab. See denial reason below.  Here are the policy requirements your request did not meet:   The request does not meet the PDL trial and failure criteria for the use of a non-preferred drug. To meet the criteria for approval, you must first try two of the following preferred drugs on the PDL:  Elidel Cream (brand name), Eucrisa 2% ointment, Protopic Ointment, tacrolimus ointment (generic for Protopic)  PA #/Case ID/Reference #: [id number]

## 2023-05-01 MED ORDER — PIMECROLIMUS 1 % EX CREA: TOPICAL_CREAM | Freq: Two times a day (BID) | CUTANEOUS | 2 refills | Status: DC | PRN

## 2023-05-01 NOTE — Telephone Encounter (Signed)
Brand Elidel has been sent in to [facility] in [place] on [address] I called patient to inform but the voicemail is full and was unable to leave a message.

## 2023-05-01 NOTE — Addendum Note (Signed)
Addended by: Elsworth Soho on: 05/01/2023 10:21 AM   Modules accepted: Orders

## 2023-05-02 ENCOUNTER — Telehealth: Payer: Self-pay | Admitting: Family Medicine

## 2023-05-02 NOTE — Telephone Encounter (Signed)
-----   Message from [facility] McDiarmid sent at 05/01/2023 10:19 AM EST ----- Consider start of statin for patient given his TG > 500. ----- Message ----- From: Interface, [facility] Lab Results In Sent: 04/27/2023   8:14 AM EST To: Leighton Roach McDiarmid, MD

## 2023-05-02 NOTE — Telephone Encounter (Signed)
Called patient and after confirming name and date of birth, informed him of his extremely elevated lipid levels. He had been on rosuvastatin 40 mg in the past but is not currently on it. He could not recall why it had been stopped. I will perform chart review, and if there are no preculding factors, restart him on Rosuvastatin 40 mg. Patient is aware and agrees with the plan.

## 2023-05-03 ENCOUNTER — Telehealth: Payer: Self-pay | Admitting: Allergy

## 2023-05-03 MED ORDER — ROSUVASTATIN CALCIUM 40 MG PO TABS: 40.0000 mg | ORAL_TABLET | Freq: Every day | ORAL | 3 refills | Status: DC

## 2023-05-03 NOTE — Telephone Encounter (Signed)
Medication pended to this encounter.   Talbot Grumbling, RN

## 2023-05-03 NOTE — Telephone Encounter (Signed)
Patient called stating he needed a Prior Authorization for Elidel Cream.

## 2023-05-03 NOTE — Telephone Encounter (Signed)
Advise to pa I believe its also on, onbase

## 2023-05-04 ENCOUNTER — Other Ambulatory Visit: Payer: Self-pay

## 2023-05-04 ENCOUNTER — Telehealth: Payer: Self-pay

## 2023-05-04 ENCOUNTER — Other Ambulatory Visit (HOSPITAL_COMMUNITY): Payer: Self-pay

## 2023-05-04 DIAGNOSIS — E1169 Type 2 diabetes mellitus with other specified complication: Secondary | ICD-10-CM

## 2023-05-04 NOTE — Telephone Encounter (Signed)
*  Asthma/Allergy  Pharmacy Patient Advocate Encounter   Received notification from Pt Calls Messages that prior authorization for Elidel 1% cream  is required/requested.   Insurance verification completed.   The patient is insured through [facility] .   Per test claim: PA required; PA submitted to above mentioned insurance via CoverMyMeds Key/confirmation #/EOC [id number] Status is pending

## 2023-05-04 NOTE — Telephone Encounter (Signed)
Pharmacy Patient Advocate Encounter  Received notification from [facility] that Prior Authorization for Elidel 1% has been DENIED.  No reason given; No denial letter received via Fax or CMM. It has been requested and will be uploaded to the media tab once received.  *Previous denial on file as well

## 2023-05-04 NOTE — Telephone Encounter (Signed)
PA request has been Submitted. New Encounter created for follow up. For additional info see Pharmacy Prior Auth telephone encounter from 12/20.

## 2023-05-04 NOTE — Telephone Encounter (Signed)
PA denied. No reason given. Information has been provided to the provider on another thread.

## 2023-05-04 NOTE — Telephone Encounter (Signed)
Tacrolimus (Protopic), Crisaborole (Eucrisa), Opzelura, pimecrilumus (Elidel) are all showing as coverred - will have to wait for PA Denial letter to see what the alternatives/substitutions are preferred.

## 2023-05-04 NOTE — Telephone Encounter (Signed)
Per Provider:  What is covered between Protopic (tacrolimus), Eucrisa and Opzelura?     Do we know if its denied due it being brand Elidel?  Do they cover the generic Pimecrilumus?

## 2023-05-06 ENCOUNTER — Other Ambulatory Visit: Payer: Self-pay | Admitting: Family Medicine

## 2023-05-06 DIAGNOSIS — R079 Chest pain, unspecified: Secondary | ICD-10-CM

## 2023-05-07 ENCOUNTER — Encounter: Payer: Self-pay | Admitting: Family Medicine

## 2023-05-07 MED ORDER — TACROLIMUS 0.1 % EX OINT: TOPICAL_OINTMENT | CUTANEOUS | 1 refills | Status: DC

## 2023-05-07 NOTE — Telephone Encounter (Addendum)
Per Provider:  [place] then we can try for Protopic (tacrolimus) then 1 application twice a day as needed for rash.   New prescription sent in to pharmacy.  Per Denial letter, preferred: BRAND Elidel cream; BRAND Eucrisa 2% ointment; BRAND Protopic oinment; Generic (Tacrolimus) Ointment.  Called patient - DOB/Pharmacy verified - advise of above notation.  Patient verbalized understanding to all, no further questions.

## 2023-05-07 NOTE — Addendum Note (Signed)
Addended by: Areta Haber B on: 05/07/2023 11:22 AM   Modules accepted: Orders

## 2023-05-15 ENCOUNTER — Other Ambulatory Visit (HOSPITAL_COMMUNITY): Payer: Self-pay

## 2023-05-15 ENCOUNTER — Telehealth: Payer: Self-pay

## 2023-05-15 NOTE — Telephone Encounter (Signed)
*  Asthma/Allergy  Pharmacy Patient Advocate Encounter   Received notification from CoverMyMeds that prior authorization for Tacrolimus  0.1% ointment  is required/requested.   Insurance verification completed.   The patient is insured through [facility] .   Per test claim: PA required; PA submitted to above mentioned insurance via CoverMyMeds Key/confirmation #/EOC [id number] Status is pending

## 2023-05-17 ENCOUNTER — Other Ambulatory Visit (HOSPITAL_COMMUNITY): Payer: Self-pay

## 2023-05-17 NOTE — Telephone Encounter (Signed)
 Pharmacy Patient Advocate Encounter  Received notification from [facility] that Prior Authorization for Tacrolimus  0.1% has been APPROVED from 05/15/2023 to 05/14/2024. Ran test claim, Copay is $4.00. This test claim was processed through [facility]- copay amounts may vary at other pharmacies due to pharmacy/plan contracts, or as the patient moves through the different stages of their insurance plan.

## 2023-05-21 ENCOUNTER — Ambulatory Visit: Payer: MEDICAID | Admitting: Family Medicine

## 2023-05-21 NOTE — Progress Notes (Deleted)
 Follow-up Note  RE: [name] MRN: [id number] DOB: [birth year] Date of Office Visit: 05/21/2023  Primary care provider: Cleotilde Lukes, DO Referring provider: Cleotilde Lukes, DO   Andrez returns to the office today for the patch test placement, given suspected history of contact dermatitis.    Diagnostics: True Test patches placed.   Plan:   Allergic contact dermatitis - Instructions provided on care of the patches for the next 48 hours. GLENWOOD Jeral was instructed to avoid showering for the next 48 hours. GLENWOOD Jeral will follow up in 48 hours and 96 hours for patch readings.

## 2023-05-23 ENCOUNTER — Encounter: Payer: MEDICAID | Admitting: Family

## 2023-05-25 ENCOUNTER — Telehealth: Payer: MEDICAID | Admitting: Family Medicine

## 2023-05-25 ENCOUNTER — Ambulatory Visit: Payer: MEDICAID | Admitting: Family Medicine

## 2023-05-25 ENCOUNTER — Encounter: Payer: MEDICAID | Admitting: Internal Medicine

## 2023-05-25 ENCOUNTER — Telehealth: Payer: MEDICAID | Admitting: Emergency Medicine

## 2023-05-25 DIAGNOSIS — I10 Essential (primary) hypertension: Secondary | ICD-10-CM

## 2023-05-25 DIAGNOSIS — R519 Headache, unspecified: Secondary | ICD-10-CM

## 2023-05-25 DIAGNOSIS — G43409 Hemiplegic migraine, not intractable, without status migrainosus: Secondary | ICD-10-CM

## 2023-05-25 MED ORDER — SUMATRIPTAN SUCCINATE 100 MG PO TABS: 100.0000 mg | ORAL_TABLET | ORAL | 0 refills | Status: DC | PRN

## 2023-05-25 NOTE — Patient Instructions (Signed)
 General Headache Without Cause A headache is pain or discomfort felt around the head or neck area. There are many causes and types of headaches. A few common types include: Tension headaches. Migraine headaches. Cluster headaches. Chronic daily headaches. Sometimes, the specific cause of a headache may not be found. Follow these instructions at home: Watch your condition for any changes. Let your health care provider know about them. Take these steps to help with your condition: Managing pain     Take over-the-counter and prescription medicines only as told by your health care provider. Treatment may include medicines for pain that are taken by mouth or applied to the skin. Lie down in a dark, quiet room when you have a headache. Keep lights dim if bright lights bother you or make your headaches worse. If directed, put ice on your head and neck area: Put ice in a plastic bag. Place a towel between your skin and the bag. Leave the ice on for 20 minutes, 2-3 times per day. Remove the ice if your skin turns bright red. This is very important. If you cannot feel pain, heat, or cold, you have a greater risk of damage to the area. If directed, apply heat to the affected area. Use the heat source that your health care provider recommends, such as a moist heat pack or a heating pad. Place a towel between your skin and the heat source. Leave the heat on for 20-30 minutes. Remove the heat if your skin turns bright red. This is especially important if you are unable to feel pain, heat, or cold. You have a greater risk of getting burned. Eating and drinking Eat meals on a regular schedule. If you drink alcohol: Limit how much you have to: 0-1 drink a day for women who are not pregnant. 0-2 drinks a day for men. Know how much alcohol is in a drink. In the [place] one drink equals one 12 oz bottle of beer (355 mL), one 5 oz glass of wine (148 mL), or one 1 oz glass of hard liquor (44 mL). Stop  drinking caffeine, or decrease the amount of caffeine you drink. Drink enough fluid to keep your urine pale yellow. General instructions  Keep a headache journal to help find out what may trigger your headaches. For example, write down: What you eat and drink. How much sleep you get. Any change to your diet or medicines. Try massage or other relaxation techniques. Limit stress. Sit up straight, and do not tense your muscles. Do not use any products that contain nicotine or tobacco. These products include cigarettes, chewing tobacco, and vaping devices, such as e-cigarettes. If you need help quitting, ask your health care provider. Exercise regularly as told by your health care provider. Sleep on a regular schedule. Get 7-9 hours of sleep each night, or the amount recommended by your health care provider. Keep all follow-up visits. This is important. Contact a health care provider if: Medicine does not help your symptoms. You have a headache that is different from your usual headache. You have nausea or you vomit. You have a fever. Get help right away if: Your headache: Becomes severe quickly. Gets worse after moderate to intense physical activity. You have any of these symptoms: Repeated vomiting. Pain or stiffness in your neck. Changes to your vision. Pain in an eye or ear. Problems with speech. Muscular weakness or loss of muscle control. Loss of balance or coordination. You feel faint or pass out. You have confusion. You have  a seizure. These symptoms may represent a serious problem that is an emergency. Do not wait to see if the symptoms will go away. Get medical help right away. Call your local emergency services (911 in the [place] Do not drive yourself to the hospital. Summary A headache is pain or discomfort felt around the head or neck area. There are many causes and types of headaches. In some cases, the cause may not be found. Keep a headache journal to help find out  what may trigger your headaches. Watch your condition for any changes. Let your health care provider know about them. Contact a health care provider if you have a headache that is different from the usual headache, or if your symptoms are not helped by medicine. Get help right away if your headache becomes severe, you vomit, you have a loss of vision, you lose your balance, or you have a seizure. This information is not intended to replace advice given to you by your health care provider. Make sure you discuss any questions you have with your health care provider. Document Revised: 09/29/2020 Document Reviewed: 09/29/2020 Elsevier Patient Education  2024 [employer].

## 2023-05-25 NOTE — Progress Notes (Signed)
 We recommend that you schedule a Virtual Urgent Care video visit in order for the provider to better assess what is going on.  The provider will be able to give you a more accurate diagnosis and treatment plan if we can more freely discuss your symptoms and with the addition of a virtual examination.   If you change your visit to a video visit, we will bill your insurance (similar to an office visit) and you will not be charged for this e-Visit. You will be able to stay at home and speak with the first available [facility] advanced practice provider. The link to do a video visit is in the drop down Menu tab of your Welcome screen in MyChart.
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
## 2023-05-25 NOTE — Progress Notes (Signed)
 Because Mr. Nickson, I feel your condition warrants further evaluation and I recommend that you be seen in a face to face visit.   NOTE: There will be NO CHARGE for this E-Visit   If you are having a true medical emergency please call 911.     For an urgent face to face visit, [place] has multiple urgent care centers for your convenience.   [facility] at [facility] Your location to [facility] at [facility] [phone] [address] [place], [place] [address]  Urgent Care at [facility] [place], [place]  [place] Your location to [facility] Care at [facility] [phone] [address] [place],  [place]  [address]  [facility] Urgent Care Center at [facility] Get Driving Directions [phone] [address] [place], [address]    [facility] Health Urgent Care Center at [facility] Get Driving Directions [phone] [address] [place], [place] [address]   [facility] Health Urgent [facility]) Get Driving Directions [phone] [address] [place], [place] [address]  [facility] Urgent Care Center [facility]) Get Driving Directions [phone] [address] [place],  [place]  [address]  [facility] Urgent Care Center [facility] - at [employer]  [phone] [address] [place],  [place] [address]   [facility] Health Urgent Care at [facility] Get Driving Directions [phone] [address] [place], [place] [address]   [facility] Health Urgent Care at [facility] Get Driving Directions  [phone] [address] [place], [place] [address]   [facility] Health Urgent Care at [facility] Directions [phone] [address] [place], [place] [address]  Your MyChart E-visit  questionnaire answers were reviewed by a board certified advanced clinical practitioner to complete your personal care plan based on your specific symptoms.  Thank you for using e-Visits.

## 2023-05-27 ENCOUNTER — Encounter: Payer: Self-pay | Admitting: Family Medicine

## 2023-05-27 ENCOUNTER — Other Ambulatory Visit: Payer: Self-pay | Admitting: Family Medicine

## 2023-05-27 DIAGNOSIS — E118 Type 2 diabetes mellitus with unspecified complications: Secondary | ICD-10-CM

## 2023-05-28 ENCOUNTER — Ambulatory Visit (INDEPENDENT_AMBULATORY_CARE_PROVIDER_SITE_OTHER): Payer: MEDICAID | Admitting: Family Medicine

## 2023-05-28 ENCOUNTER — Other Ambulatory Visit: Payer: Self-pay

## 2023-05-28 VITALS — BP 134/80 | HR 104 | Temp 97.3°F | Resp 14

## 2023-05-28 DIAGNOSIS — L259 Unspecified contact dermatitis, unspecified cause: Secondary | ICD-10-CM | POA: Insufficient documentation

## 2023-05-28 DIAGNOSIS — L235 Allergic contact dermatitis due to other chemical products: Secondary | ICD-10-CM

## 2023-05-28 MED ORDER — LIRAGLUTIDE 18 MG/3ML ~~LOC~~ SOPN: 1.8000 mg | PEN_INJECTOR | Freq: Every day | SUBCUTANEOUS | 0 refills | Status: DC

## 2023-05-28 MED ORDER — BD PEN NEEDLE SHORT U/F 31G X 8 MM MISC: 0 refills | Status: DC

## 2023-05-28 NOTE — Progress Notes (Signed)
 Follow-up Note  RE: Marcus [name] MRN: [id number] DOB: January 18, 1976 Date of Office Visit: 05/28/2023  Primary care provider: Cleotilde Lukes, DO Referring provider: Cleotilde Lukes, DO   Tait returns to the office today for the patch test placement, given suspected history of contact dermatitis.    Diagnostics: True Test patches placed.   Plan:   Allergic contact dermatitis - Instructions provided on care of the patches for the next 48 hours. Marcus [name] was instructed to avoid showering for the next 48 hours. Marcus [name] will follow up in 48 hours and 96 hours for patch readings.    Call the clinic if this treatment plan is not working well for you  Follow up in 2 days or sooner if needed.

## 2023-05-28 NOTE — Patient Instructions (Signed)
 Diagnostics: True Test patches placed.   Plan:   Allergic contact dermatitis - Instructions provided on care of the patches for the next 48 hours. GLENWOOD Plenty was instructed to avoid showering for the next 48 hours. GLENWOOD Plenty will follow up in 48 hours and 96 hours for patch readings.    Call the clinic if this treatment plan is not working well for you  Follow up in 2 days or sooner if needed.

## 2023-05-29 ENCOUNTER — Telehealth: Payer: Self-pay

## 2023-05-29 NOTE — Telephone Encounter (Signed)
 Forwarding message to patient via his myChart.

## 2023-05-29 NOTE — Progress Notes (Deleted)
   Follow Up Note  RE: Marcus [name] MRN: [id number] DOB: [birth year] Date of Office Visit: 05/30/2023  Referring provider: Rayma Calandra, DO Primary care provider: Rayma Calandra, DO  History of Present Illness: I had the pleasure of seeing Marcus [name] for a follow up visit at the [facility] on 05/29/2023. He is a 48 y.o. male, who is being followed for dermatitis. Today he is here for initial patch test interpretation, given suspected history of contact dermatitis.   Diagnostics:  TRUE TEST 48 hour reading:     Assessment and Plan: Marcus [name] is a 48 y.o. male with: There are no diagnoses linked to this encounter. TRUE Patches removed and 48 hour reading was ***. The patient has been provided detailed information regarding the substances he is sensitive to, as well as products containing the substances.  Meticulous avoidance of these substances is recommended.   No follow-ups on file.  It was my pleasure to see Marcus [name] today and participate in his care. Please feel free to contact me with any questions or concerns.  Sincerely,  Eudelia Hero, DO Allergy & Immunology  [facility]  [facility] office: [phone] [facility] office: [phone] [place] office: [phone]

## 2023-05-29 NOTE — Telephone Encounter (Signed)
 Please call patient.  He has to take the patches off tomorrow and he needs to remark the dots on his back with a pen.  Then take a picture of the back and send via mychart.  However this is NOT ideal as pictures sometimes don't show up well and it may be difficult for us  to interpret the pictures.   Can he come in Thursday or Friday?

## 2023-05-29 NOTE — Telephone Encounter (Addendum)
 Patient called and he doesn't have transportation for tomorrow as he has two flat tires. He is scheduled for a reading on tomorrow and Friday. Patient states he will not be able to get his car fixed due to finances till next week. I asked if he could get Medicaid to do transportation and he has to call them 3 days in advance. Dr. Luke, how would you like to proceed in his readings?  Beth and I also offered him a bus pass and patient stated he was not in the bus route.  Patient is on MyChart. Please update patient via MyChart or phone call.   Thanks

## 2023-05-30 ENCOUNTER — Encounter: Payer: MEDICAID | Admitting: Allergy

## 2023-05-30 DIAGNOSIS — L2389 Allergic contact dermatitis due to other agents: Secondary | ICD-10-CM

## 2023-05-31 ENCOUNTER — Telehealth: Payer: Self-pay | Admitting: Allergy

## 2023-05-31 ENCOUNTER — Ambulatory Visit: Payer: MEDICAID | Admitting: Family Medicine

## 2023-05-31 NOTE — Progress Notes (Deleted)
    SUBJECTIVE:   CHIEF COMPLAINT / HPI:   ***  PERTINENT  PMH / PSH: ***  OBJECTIVE:   There were no vitals taken for this visit.  ***  ASSESSMENT/PLAN:   No problem-specific Assessment & Plan notes found for this encounter.     Gerrit Heck, DO [facility]

## 2023-05-31 NOTE — Telephone Encounter (Signed)
Patient called and stated that he took pictures of his back and uploaded them on his mychart.

## 2023-05-31 NOTE — Telephone Encounter (Signed)
Reviewed and noted. See mychart messages.

## 2023-06-01 ENCOUNTER — Encounter: Payer: MEDICAID | Admitting: Family Medicine

## 2023-06-01 ENCOUNTER — Other Ambulatory Visit: Payer: Self-pay | Admitting: Family Medicine

## 2023-06-04 ENCOUNTER — Ambulatory Visit: Payer: MEDICAID | Admitting: Allergy

## 2023-06-04 ENCOUNTER — Other Ambulatory Visit: Payer: Self-pay | Admitting: Family Medicine

## 2023-06-04 DIAGNOSIS — I152 Hypertension secondary to endocrine disorders: Secondary | ICD-10-CM

## 2023-06-06 ENCOUNTER — Encounter: Payer: MEDICAID | Admitting: Family

## 2023-06-07 ENCOUNTER — Encounter: Payer: Self-pay | Admitting: Family Medicine

## 2023-06-07 ENCOUNTER — Ambulatory Visit (INDEPENDENT_AMBULATORY_CARE_PROVIDER_SITE_OTHER): Payer: MEDICAID | Admitting: Family Medicine

## 2023-06-07 ENCOUNTER — Other Ambulatory Visit: Payer: Self-pay | Admitting: Family Medicine

## 2023-06-07 VITALS — BP 124/86 | HR 112 | Ht 65.0 in | Wt 254.4 lb

## 2023-06-07 DIAGNOSIS — K5900 Constipation, unspecified: Secondary | ICD-10-CM | POA: Diagnosis not present

## 2023-06-07 DIAGNOSIS — G43409 Hemiplegic migraine, not intractable, without status migrainosus: Secondary | ICD-10-CM

## 2023-06-07 DIAGNOSIS — E785 Hyperlipidemia, unspecified: Secondary | ICD-10-CM | POA: Diagnosis not present

## 2023-06-07 DIAGNOSIS — E1169 Type 2 diabetes mellitus with other specified complication: Secondary | ICD-10-CM | POA: Diagnosis not present

## 2023-06-07 DIAGNOSIS — R079 Chest pain, unspecified: Secondary | ICD-10-CM

## 2023-06-07 DIAGNOSIS — E1159 Type 2 diabetes mellitus with other circulatory complications: Secondary | ICD-10-CM

## 2023-06-07 DIAGNOSIS — I152 Hypertension secondary to endocrine disorders: Secondary | ICD-10-CM

## 2023-06-07 MED ORDER — SENNA 8.6 MG PO TABS: 1.0000 | ORAL_TABLET | Freq: Every day | ORAL | 0 refills | Status: DC

## 2023-06-07 MED ORDER — SUMATRIPTAN SUCCINATE 100 MG PO TABS: 100.0000 mg | ORAL_TABLET | ORAL | 0 refills | Status: DC | PRN

## 2023-06-07 NOTE — Assessment & Plan Note (Signed)
Likely multifactorial given reproducibility with chest stretch and strength testing.  It is concerning that he is having shortness of breath and dizziness with activity however.  Patient has appointment with cardiology scheduled for 1/28.  Strongly advised him to keep this appointment and discussed what imaging they would likely look for at that time.  He will follow-up with me in March for his next A1c check, at which time we will go over what was done at the cardiology office.  If for some reason they do not obtain an echocardiogram we will plan to do so at that time and we will also consider cardiac stress testing as I am concerned he may have some component of coronary artery disease.

## 2023-06-07 NOTE — Progress Notes (Signed)
    SUBJECTIVE:   CHIEF COMPLAINT / HPI:   BP follow up Triglycerides were very high at last visit, and Creatinine was elevated. Wanted to consider switching to a different GLP-1 from victoza for greater weight loss effect.   Headache- First started having them a year ago, had it started back up 2 months ago.  Had a video appointment on 1/10 where he was represcribed sumatriptan for his headaches as a day matched the pattern of his previous migraines.  It appears that his previous diagnosis of hemiplegic migraine was come to about a year ago he had previously never had migraines before.  He underwent extensive workup at that time with no concerning findings on imaging no dizziness no blurred or double vision.  Today he is endorsing 5 out of 10 pain behind the left orbit.  Sumatriptan hand that he was prescribed on 110 did alleviate the pain completely but he has used all of it at this time and would like a refill  Chest pain- intermittent, radiates down both arms. Has a cardiologist appointment on Tuesday. Feels more like a pulling than a true pain.  Endorses some weakness/shortness of breath with walking.  When he rests the sensation goes away and he is able to resume his activities but after a period of time does have to rest again.  He denies any enteral chest pain or sensation of palpitations, but does have dizziness along with this weakness/shortness of breath.  PERTINENT  PMH / PSH: Hypertension, diabetes, hypertriglyceridemia  OBJECTIVE:   BP 124/86   Pulse (!) 112   Ht 5\' 5"  (1.651 m)   Wt 254 lb 6.4 oz (115.4 kg)   SpO2 99%   BMI 42.33 kg/m   General: A&O, NAD HEENT: No sign of trauma, EOM grossly intact Cardiac: Tachycardic, prominent S1, no m/r/g Respiratory: CTAB, normal WOB, no w/c/r Extremities: NTTP, no peripheral edema. Neuro: Normal gait, moves all four extremities appropriately. Psych: Appropriate mood and affect   ASSESSMENT/PLAN:   Hypertension associated with  diabetes (HCC) Repeat CMP today, Creatinine was elevated, want to check his liver enzymes as well given extremely high triglycerides.  Migraine Refilled sumatriptan 100 mg.  Reiterated instructions to check blood pressure prior to taking, and instructed him not to take it if his blood pressure was higher than 140/90  Chest pain Likely multifactorial given reproducibility with chest stretch and strength testing.  It is concerning that he is having shortness of breath and dizziness with activity however.  Patient has appointment with cardiology scheduled for 1/28.  Strongly advised him to keep this appointment and discussed what imaging they would likely look for at that time.  He will follow-up with me in March for his next A1c check, at which time we will go over what was done at the cardiology office.  If for some reason they do not obtain an echocardiogram we will plan to do so at that time and we will also consider cardiac stress testing as I am concerned he may have some component of coronary artery disease.  Hyperlipidemia associated with type 2 diabetes mellitus (HCC) Last reading was obscured by extremely high triglycerides.  Will repeat lipid panel today as well as CMP to evaluate liver and kidney function.    Gerrit Heck, DO [facility]

## 2023-06-07 NOTE — Patient Instructions (Addendum)
It was wonderful to see you today!  We discussed your headaches and your chest pain.  For your headaches I have refilled your sumatriptan.  Just like the doctor who gave it to you said please check your blood pressure before you take it to make sure that your blood pressure is not too high.  If your blood pressure is greater than 140/90 do not take your sumatriptan.  Otherwise it is safe to take every 2 hours while you have a headache.  For your chest pain I have attached some stretches that you can do to help relieve any muscle tension.  I am still concerned that there may be an underlying heart problem that could be causing your tiredness and some of this pain.  Please keep your appointment with cardiology on Tuesday, January 28.  I will follow-up with you in March to recheck your blood sugar and to discuss any changes that the cardiologist may have made to your medicines.   Please call [phone] with any questions about today's appointment.   If you need any additional refills, please call your pharmacy before calling the office.  Gerrit Heck, DO Family Medicine

## 2023-06-07 NOTE — Assessment & Plan Note (Signed)
Repeat CMP today, Creatinine was elevated, want to check his liver enzymes as well given extremely high triglycerides.

## 2023-06-07 NOTE — Assessment & Plan Note (Signed)
Refilled sumatriptan 100 mg.  Reiterated instructions to check blood pressure prior to taking, and instructed him not to take it if his blood pressure was higher than 140/90

## 2023-06-07 NOTE — Assessment & Plan Note (Signed)
Last reading was obscured by extremely high triglycerides.  Will repeat lipid panel today as well as CMP to evaluate liver and kidney function.

## 2023-06-08 ENCOUNTER — Encounter: Payer: MEDICAID | Admitting: Internal Medicine

## 2023-06-08 ENCOUNTER — Telehealth: Payer: Self-pay | Admitting: Family Medicine

## 2023-06-08 ENCOUNTER — Encounter: Payer: Self-pay | Admitting: Family Medicine

## 2023-06-08 LAB — COMPREHENSIVE METABOLIC PANEL
ALT: 25 [IU]/L (ref 0–44)
AST: 20 [IU]/L (ref 0–40)
Albumin: 4.1 g/dL (ref 4.1–5.1)
Alkaline Phosphatase: 66 [IU]/L (ref 44–121)
BUN/Creatinine Ratio: 12 (ref 9–20)
BUN: 24 mg/dL (ref 6–24)
Bilirubin Total: <0.2 mg/dL (ref 0.0–1.2)
CO2: 23 mmol/L (ref 20–29)
Calcium: 9.8 mg/dL (ref 8.7–10.2)
Chloride: 98 mmol/L (ref 96–106)
Creatinine, Ser: 2.05 mg/dL — ABNORMAL HIGH (ref 0.76–1.27)
Globulin, Total: 3.2 g/dL (ref 1.5–4.5)
Glucose: 84 mg/dL (ref 70–99)
Potassium: 4 mmol/L (ref 3.5–5.2)
Sodium: 139 mmol/L (ref 134–144)
Total Protein: 7.3 g/dL (ref 6.0–8.5)
eGFR: 39 mL/min/{1.73_m2} — ABNORMAL LOW (ref >59)

## 2023-06-08 LAB — LIPID PANEL
Chol/HDL Ratio: 8.9 ratio — ABNORMAL HIGH (ref 0.0–5.0)
Cholesterol, Total: 267 mg/dL — ABNORMAL HIGH (ref 100–199)
HDL: 30 mg/dL — ABNORMAL LOW
LDL Chol Calc (NIH): 185 mg/dL — ABNORMAL HIGH (ref 0–99)
Triglycerides: 267 mg/dL — ABNORMAL HIGH (ref 0–149)
VLDL Cholesterol Cal: 52 mg/dL — ABNORMAL HIGH (ref 5–40)

## 2023-06-08 NOTE — Telephone Encounter (Signed)
Called patient and after confirming name and date of birth, discussed results of recent lab tests. His Lipid panel shows a decrease in triglycerides, but an increase in LDL. With the patient's permission, I will forward these results to cardiology, as they may wish to refer him to the Lipid Clinic. His kidney function has also declined since his last check up. I will review his medications and plan to make changes based on kidney function as appropriate.

## 2023-06-12 ENCOUNTER — Ambulatory Visit: Payer: MEDICAID | Attending: Cardiology | Admitting: Cardiology

## 2023-06-13 ENCOUNTER — Encounter: Payer: Self-pay | Admitting: Allergy

## 2023-06-16 ENCOUNTER — Other Ambulatory Visit: Payer: Self-pay | Admitting: Family Medicine

## 2023-06-16 DIAGNOSIS — E118 Type 2 diabetes mellitus with unspecified complications: Secondary | ICD-10-CM

## 2023-06-21 ENCOUNTER — Other Ambulatory Visit (HOSPITAL_COMMUNITY): Payer: Self-pay

## 2023-06-21 ENCOUNTER — Telehealth: Payer: Self-pay

## 2023-06-21 NOTE — Telephone Encounter (Signed)
 Pharmacy Patient Advocate Encounter   Received notification from CoverMyMeds that prior authorization for Liraglutide  18MG /3ML is required/requested.   Insurance verification completed.   The patient is insured through [facility] .   Per test claim: PA required; PA submitted to above mentioned insurance via CoverMyMeds Key/confirmation #/EOC [id number]. Status is pending

## 2023-06-22 ENCOUNTER — Other Ambulatory Visit: Payer: Self-pay

## 2023-06-22 ENCOUNTER — Emergency Department (HOSPITAL_BASED_OUTPATIENT_CLINIC_OR_DEPARTMENT_OTHER): Payer: MEDICAID | Admitting: Radiology

## 2023-06-22 DIAGNOSIS — N183 Chronic kidney disease, stage 3 unspecified: Secondary | ICD-10-CM | POA: Diagnosis not present

## 2023-06-22 DIAGNOSIS — R079 Chest pain, unspecified: Secondary | ICD-10-CM | POA: Insufficient documentation

## 2023-06-22 DIAGNOSIS — E1122 Type 2 diabetes mellitus with diabetic chronic kidney disease: Secondary | ICD-10-CM | POA: Diagnosis not present

## 2023-06-22 DIAGNOSIS — I129 Hypertensive chronic kidney disease with stage 1 through stage 4 chronic kidney disease, or unspecified chronic kidney disease: Secondary | ICD-10-CM | POA: Diagnosis not present

## 2023-06-22 LAB — BASIC METABOLIC PANEL
Anion gap: 12 (ref 5–15)
BUN: 15 mg/dL (ref 6–20)
CO2: 22 mmol/L (ref 22–32)
Calcium: 9.8 mg/dL (ref 8.9–10.3)
Chloride: 100 mmol/L (ref 98–111)
Creatinine, Ser: 1.58 mg/dL — ABNORMAL HIGH (ref 0.61–1.24)
GFR, Estimated: 54 mL/min — ABNORMAL LOW (ref >60)
Glucose, Bld: 151 mg/dL — ABNORMAL HIGH (ref 70–99)
Potassium: 3.9 mmol/L (ref 3.5–5.1)
Sodium: 134 mmol/L — ABNORMAL LOW (ref 135–145)

## 2023-06-22 LAB — CBC
HCT: 39.8 % (ref 39.0–52.0)
Hemoglobin: 13.4 g/dL (ref 13.0–17.0)
MCH: 30 pg (ref 26.0–34.0)
MCHC: 33.7 g/dL (ref 30.0–36.0)
MCV: 89 fL (ref 80.0–100.0)
Platelets: 353 10*3/uL (ref 150–400)
RBC: 4.47 MIL/uL (ref 4.22–5.81)
RDW: 12.6 % (ref 11.5–15.5)
WBC: 6.8 10*3/uL (ref 4.0–10.5)
nRBC: 0 % (ref 0.0–0.2)

## 2023-06-22 LAB — TROPONIN I (HIGH SENSITIVITY): Troponin I (High Sensitivity): 4 ng/L (ref <18)

## 2023-06-22 LAB — CBG MONITORING, ED: Glucose-Capillary: 149 mg/dL — ABNORMAL HIGH (ref 70–99)

## 2023-06-22 NOTE — Telephone Encounter (Signed)
 Pharmacy Patient Advocate Encounter  Received notification from [facility] that Prior Authorization for Liraglutide  18MG /3ML (Victoza )  has been DENIED.  Full denial letter will be uploaded to the media tab. See denial reason below.    PA #/Case ID/Reference #: [id number]

## 2023-06-22 NOTE — ED Triage Notes (Signed)
 Pt POV reporting bilateral chest pain that began a couple hours ago, denies SOB. Denies congestion.

## 2023-06-23 ENCOUNTER — Emergency Department (HOSPITAL_BASED_OUTPATIENT_CLINIC_OR_DEPARTMENT_OTHER)
Admission: EM | Admit: 2023-06-23 | Discharge: 2023-06-23 | Disposition: A | Discharge status: Home / Self Care | Payer: MEDICAID | Attending: Emergency Medicine | Admitting: Emergency Medicine

## 2023-06-23 ENCOUNTER — Other Ambulatory Visit: Payer: Self-pay | Admitting: Family Medicine

## 2023-06-23 DIAGNOSIS — E118 Type 2 diabetes mellitus with unspecified complications: Secondary | ICD-10-CM

## 2023-06-23 DIAGNOSIS — R079 Chest pain, unspecified: Secondary | ICD-10-CM

## 2023-06-23 LAB — TROPONIN I (HIGH SENSITIVITY): Troponin I (High Sensitivity): 4 ng/L (ref <18)

## 2023-06-23 NOTE — Discharge Instructions (Signed)
 You were evaluated in the Emergency Department and after careful evaluation, we did not find any emergent condition requiring admission or further testing in the hospital.  Your exam/testing today is overall reassuring.  Recommend follow-up with your regular doctors to discuss her symptoms.  Please return to the Emergency Department if you experience any worsening of your condition.   Thank you for allowing us  to be a part of your care.
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
## 2023-06-24 ENCOUNTER — Other Ambulatory Visit: Payer: Self-pay | Admitting: Family Medicine

## 2023-06-24 ENCOUNTER — Telehealth: Payer: Self-pay | Admitting: Family Medicine

## 2023-06-24 DIAGNOSIS — E118 Type 2 diabetes mellitus with unspecified complications: Secondary | ICD-10-CM

## 2023-06-24 MED ORDER — NOVOLOG FLEXPEN 100 UNIT/ML ~~LOC~~ SOPN: 5.0000 [IU] | PEN_INJECTOR | Freq: Every day | SUBCUTANEOUS | 3 refills | Status: DC

## 2023-06-24 MED ORDER — LANTUS SOLOSTAR 100 UNIT/ML ~~LOC~~ SOPN: 45.0000 [IU] | PEN_INJECTOR | Freq: Every day | SUBCUTANEOUS | 2 refills | Status: DC

## 2023-06-24 NOTE — Telephone Encounter (Signed)
**   AFTER HOURS PAGE **  Spoke with patient and his wife regarding insulin , they state that he needs refill on one of his insulins and the other has not been refilled due to prior authorization with the pharmacy.  Requesting refill of both insulins (NovoLog  and Lantus ) as he is completely out of insulin  at this time.  Lantus  and NovoLog  refilled and sent to preferred pharmacy [facility] on [address].  Advised them to call the clinic tomorrow morning if unable to obtain insulin  tonight to have further assistance from office staff.  Izetta Nap, DO

## 2023-06-25 ENCOUNTER — Other Ambulatory Visit: Payer: Self-pay | Admitting: Family Medicine

## 2023-06-25 DIAGNOSIS — G43409 Hemiplegic migraine, not intractable, without status migrainosus: Secondary | ICD-10-CM

## 2023-06-25 NOTE — Telephone Encounter (Signed)
 Insulin  refilled by special request on 2/9, refill not needed at this time.

## 2023-06-29 ENCOUNTER — Other Ambulatory Visit: Payer: Self-pay

## 2023-06-29 ENCOUNTER — Ambulatory Visit: Payer: MEDICAID | Admitting: Allergy

## 2023-06-29 ENCOUNTER — Encounter: Payer: Self-pay | Admitting: Allergy

## 2023-06-29 ENCOUNTER — Other Ambulatory Visit: Payer: Self-pay | Admitting: Family Medicine

## 2023-06-29 VITALS — BP 122/84 | HR 121 | Temp 98.4°F | Resp 12

## 2023-06-29 DIAGNOSIS — L235 Allergic contact dermatitis due to other chemical products: Secondary | ICD-10-CM

## 2023-06-29 DIAGNOSIS — L308 Other specified dermatitis: Secondary | ICD-10-CM

## 2023-06-29 DIAGNOSIS — Z888 Allergy status to other drugs, medicaments and biological substances status: Secondary | ICD-10-CM

## 2023-06-29 DIAGNOSIS — L299 Pruritus, unspecified: Secondary | ICD-10-CM

## 2023-06-29 DIAGNOSIS — T508X5D Adverse effect of diagnostic agents, subsequent encounter: Secondary | ICD-10-CM

## 2023-06-29 DIAGNOSIS — E118 Type 2 diabetes mellitus with unspecified complications: Secondary | ICD-10-CM

## 2023-06-29 DIAGNOSIS — T7800XD Anaphylactic reaction due to unspecified food, subsequent encounter: Secondary | ICD-10-CM

## 2023-06-29 MED ORDER — TRIAMCINOLONE ACETONIDE 0.5 % EX OINT: 1.0000 | TOPICAL_OINTMENT | Freq: Two times a day (BID) | CUTANEOUS | 3 refills | Status: DC | PRN

## 2023-06-29 MED ORDER — PIMECROLIMUS 1 % EX CREA: TOPICAL_CREAM | Freq: Two times a day (BID) | CUTANEOUS | 2 refills | Status: DC | PRN

## 2023-06-29 NOTE — Progress Notes (Signed)
Follow-up Note  RE: Marcus [name] MRN: [id number] DOB: 1976-04-28 Date of Office Visit: 06/29/2023   History of present illness: Marcus [name] is a 48 y.o. male presenting today for follow-up of dermatitis, food allergy and contrast dye allergy.  His last office visit was on 04/25/2023 by myself. We attempted to do patch testing however he did not have transportation to get back to the office after the patches were placed.  We did have him take the patches off after 2 days and provide [place] with pictures which from the pictures was not revealing of any positive allergens however this is not ideal.  He is interested in getting the patch testing being replaced to be able to perform this testing improperly for in person examination of the patch areas can be done.   He states that his itching and rash symptoms are a bit better. He states he got a "3 pack of ointment" but he is not sure which ones he has and applies every other day and it eases up the itch and the rash. He continues to avoid nuts and has access to his epinephrine device.  Review of systems: 10pt ROS  negative unless noted above in HPI  All other systems negative unless noted above in HPI  Past medical/social/surgical/family history have been reviewed and are unchanged unless specifically indicated below.  No changes  Medication List: Current Outpatient Medications  Medication Sig Dispense Refill   amLODipine (NORVASC) 10 MG tablet Take 10 mg by mouth daily.     cetirizine (ZYRTEC ALLERGY) 10 MG tablet Take 1 tablet (10 mg total) by mouth daily. 90 tablet 1   Continuous Blood Gluc Receiver (FREESTYLE LIBRE 2 READER) DEVI Use as directed to check blood sugar continuously 1 each 11   Continuous Blood Gluc Sensor (FREESTYLE LIBRE 2 SENSOR) MISC Use as directed to check blood sugar continuously 1 each 11   empagliflozin (JARDIANCE) 25 MG TABS tablet Take 1 tablet (25 mg total) by mouth daily. 90 tablet 3   EPINEPHrine  (EPIPEN 2-PAK) 0.3 mg/0.3 mL IJ SOAJ injection Inject 0.3 mg into the muscle as needed for anaphylaxis. 2 each 2   ezetimibe (ZETIA) 10 MG tablet Take 1 tablet (10 mg total) by mouth daily. 90 tablet 3   hydrochlorothiazide (HYDRODIURIL) 25 MG tablet Take 1 tablet (25 mg total) by mouth daily. 30 tablet 3   insulin aspart (NOVOLOG FLEXPEN) 100 UNIT/ML FlexPen Inject 5 Units into the skin daily. Prior to largest meal. 3 mL 3   insulin glargine (LANTUS SOLOSTAR) 100 UNIT/ML Solostar Pen Inject 45 Units into the skin at bedtime. 15 mL 2   Insulin Pen Needle (B-D ULTRAFINE III SHORT PEN) 31G X 8 MM MISC USE AS DIRECTED TO INJECT INSULIN ONCE DAILY 100 each 0   Insulin Pen Needle (B-D ULTRAFINE III SHORT PEN) 31G X 8 MM MISC USE AS DIRECTED TO INJECT INSULIN 100 each 0   liraglutide (VICTOZA) 18 MG/3ML SOPN ADMINISTER 1.8 MG UNDER THE SKIN DAILY 9 mL 0   lisinopril (ZESTRIL) 40 MG tablet TAKE 1 TABLET(40 MG) BY MOUTH DAILY 30 tablet 0   metFORMIN (GLUCOPHAGE) 1000 MG tablet Take 1 tablet (1,000 mg total) by mouth 2 (two) times daily with a meal. TAKE 1 TABLET(1000 MG) BY MOUTH TWICE DAILY WITH A MEAL Strength: 1,000 mg 180 tablet 3   Multiple Vitamin (MULTIVITAMIN ADULT PO) Take 1 tablet by mouth daily.     pantoprazole (PROTONIX) 40 MG tablet  Take 1 tablet (40 mg total) by mouth daily. 120 tablet 0   pimecrolimus (ELIDEL) 1 % cream Apply topically 2 (two) times daily as needed. 100 g 2   rosuvastatin (CRESTOR) 40 MG tablet Take 1 tablet (40 mg total) by mouth daily. 90 tablet 3   senna (SENOKOT) 8.6 MG TABS tablet Take 1 tablet (8.6 mg total) by mouth daily. 30 tablet 0   SUMAtriptan (IMITREX) 100 MG tablet Take 1 tablet (100 mg total) by mouth every 2 (two) hours as needed for migraine. May repeat in 2 hours if headache persists or recurs. 10 tablet 0   tacrolimus (PROTOPIC) 0.1 % ointment Apply 1 application topically 2 times day as needed for rash. 100 g 1   triamcinolone ointment (KENALOG) 0.5 %  Apply 1 Application topically 2 (two) times daily as needed. avoid the armpits and groin area. 30 g 3   vitamin B-12 (CYANOCOBALAMIN) 100 MCG tablet Take 100 mcg by mouth daily as needed.     zinc gluconate 50 MG tablet Take 50 mg by mouth daily.     baclofen (LIORESAL) 10 MG tablet Take 1 tablet (10 mg total) by mouth 3 (three) times daily. (Patient not taking: Reported on 06/29/2023) 30 each 0   Blood Glucose Monitoring Suppl (ACCU-CHEK GUIDE) w/Device KIT USE TO TEST BLOOD SUGAR THREE TIMES DAILY WITH MEALS AS NEEDED (Patient not taking: Reported on 05/28/2023) 1 kit 0   famotidine (PEPCID) 20 MG tablet Take 1 tablet (20 mg total) by mouth 2 (two) times daily. (Patient not taking: Reported on 06/29/2023) 20 tablet 0   fluticasone (FLONASE) 50 MCG/ACT nasal spray Place 2 sprays into both nostrils daily. (Patient not taking: Reported on 06/29/2023) 16 g 6   glucose blood test strip Please use to check sugars before each meal and prior to bedtime. (Patient not taking: Reported on 05/28/2023) 100 each 12   Lancets Misc. (ACCU-CHEK FASTCLIX LANCET) KIT 1 Units by Does not apply route as needed. (Patient not taking: Reported on 05/28/2023) 1 kit 2   naproxen (NAPROSYN) 500 MG tablet Take 1 tablet (500 mg total) by mouth 2 (two) times daily with a meal. (Patient not taking: Reported on 06/29/2023) 30 tablet 0   No current facility-administered medications for this visit.     Known medication allergies: Allergies  Allergen Reactions   Ivp Dye [Iodinated Contrast Media] Itching and Swelling    Reaction unknown per patient   Shellfish Allergy Itching   Shellfish-Derived Products     Other reaction(s): hives   Vicodin [Hydrocodone-Acetaminophen] Itching     Physical examination: Blood pressure 122/84, pulse (!) 121, temperature 98.4 F (36.9 C), temperature source Temporal, resp. rate 12, SpO2 97%.  General: Alert, interactive, in no acute distress. HEENT: PERRLA, TMs pearly gray, turbinates  non-edematous without discharge, post-pharynx non erythematous. Neck: Supple without lymphadenopathy. Lungs: Clear to auscultation without wheezing, rhonchi or rales. {no increased work of breathing. CV: Normal S1, S2 without murmurs. Abdomen: Nondistended, nontender. Skin: Numerous hyperpigmented.  Macules to papules some very excoriated but no open areas over the back . Extremities:  No clubbing, cyanosis or edema. Neuro:   Grossly intact.  Diagnositics/Labs: Labs:  Component     Latest Ref Rng 04/25/2023  IgE (Immunoglobulin E), Serum     6 - 495 IU/mL 792 (H)   D Pteronyssinus IgE     Class I kU/L 0.43 !   D Farinae IgE     Class I kU/L 0.38 !  Cat Dander IgE     Class V kU/L 20.10 !   Dog Dander IgE     Class IV kU/L 5.71 !   [place] Grass IgE     Class 0 kU/L <0.10   Timothy Grass IgE     Class 0 kU/L <0.10   [place] Grass IgE     Class 0 kU/L <0.10   Cockroach, German IgE     Class II kU/L 0.60 !   Penicillium Chrysogen IgE     Class 0 kU/L <0.10   Cladosporium Herbarum IgE     Class 0 kU/L <0.10   Aspergillus Fumigatus IgE     Class 0 kU/L <0.10   Alternaria Alternata IgE     Class 0 kU/L <0.10   Maple/Box Elder IgE     Class 0 kU/L <0.10   Common Silver Charletta Cousin IgE     Class 0 kU/L <0.10   Cedar, [place] IgE     Class 0 kU/L <0.10   Oak, White IgE     Class 0 kU/L <0.10   Elm, American IgE     Class 0 kU/L <0.10   [place] IgE     Class 0/I kU/L 0.10 !   Pecan, [place]ory IgE     Class 0 kU/L <0.10   White Mulberry IgE     Class 0 kU/L <0.10   Ragweed, Short IgE     Class 0 kU/L <0.10   Pigweed, Rough IgE     Class 0 kU/L <0.10   Sheep Sorrel IgE Qn     Class 0 kU/L <0.10   Mouse Urine IgE     Class 0 kU/L <0.10   Class Description Allergens Comment   F017-IgE Hazelnut ([place])     Class 0 kU/L <0.10   F256-IgE Walnut     Class 0/I kU/L 0.20 !   F202-IgE Cashew Nut     Class 0 kU/L <0.10   F018-IgE [place] Nut     Class 0 kU/L <0.10    Peanut, IgE     Class I kU/L 0.44 !   Macadamia Nut, IgE     Class 0 kU/L <0.10   Pecan Nut IgE     Class 0 kU/L <0.10   F203-IgE Pistachio Nut     Class 0 kU/L <0.10   F020-IgE Almond     Class 0 kU/L <0.10   F422-IgE Ara h 1     Class 0 kU/L <0.10   F423-IgE Ara h 2     Class 0 kU/L <0.10   F424-IgE Ara h 3     Class 0 kU/L <0.10   F447-IgE Ara h 6     Class 0 kU/L <0.10   F352-IgE Ara h 8     Class 0 kU/L <0.10   F427-IgE Ara h 9     Class I kU/L 0.54 !   Jug R 1 IgE     Class 0 kU/L <0.10   Jug R 3 IgE     Class I kU/L 0.36 !   Allergen Comments Note    Assessment and plan:   Chronic Pruritus and Rash Long-standing issue of severe itching, predominantly on the back, with hyperpigmentation and scabbing. Possible contact or environmental allergy. -Environmental allergy testing is positive to dust mites, cat, dog, cockroach and pollen. Allergen avoidance measures provided today.  -Bathe and soak for 5-10 minutes in warm water once a day. Pat dry.  Immediately apply the below cream  prescribed to flared areas (red, irritated, dry, itchy, patchy, scaly, flaky) only. Wait several minutes and then apply your moisturizer all over.   To affected areas on the face and neck, apply: Elidel 1% ointment twice a day as needed. Be careful to avoid the eyes. To affected areas on the body (below the face and neck), apply: Triamcinolone 0.1 % ointment twice a day as needed. With ointments be careful to avoid the armpits and groin area. -Consider Dupixent as a long-term treatment option if topical treatments are ineffective. -Recommend re-scheduling for patch placement again.  Patch testing with the TRUE test patch panels.  Patches are best placed on a Monday with return to office on Wednesday and Friday of same week for readings.  Once patches are in place to do not get them wet.  You can take antihistamines while patches are in place.   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 -Nut panel is positive to peanut and walnuts -Continue avoidance of nuts in diet -Have access to self-injectable epinephrine (Epipen or AuviQ) 0.3mg  at all times.  Discussed new option of Neffy nasal spray epinephrine device; if interested in this option let me know and we can prescribe it.  -Follow emergency action plan in case of allergic reaction  Contrast Dye Allergy Reported itching after exposure to contrast dye used in imaging studies. -Continue protocol for reaction to contrast dye  Follow-up in 6 months or sooner if needed  I appreciate the opportunity to take part in Keneth's care. Please do not hesitate to contact me with questions.  Sincerely,   Margo Aye, MD Allergy/Immunology [facility]

## 2023-06-29 NOTE — Patient Instructions (Addendum)
Chronic Pruritus and Rash Long-standing issue of severe itching, predominantly on the back, with hyperpigmentation and scabbing. Possible contact or environmental allergy. -Environmental allergy testing is positive to dust mites, cat, dog, cockroach and pollen. Allergen avoidance measures provided today.  -Bathe and soak for 5-10 minutes in warm water once a day. Pat dry.  Immediately apply the below cream prescribed to flared areas (red, irritated, dry, itchy, patchy, scaly, flaky) only. Wait several minutes and then apply your moisturizer all over.   To affected areas on the face and neck, apply: Elidel 1% ointment twice a day as needed. Be careful to avoid the eyes. To affected areas on the body (below the face and neck), apply: Triamcinolone 0.1 % ointment twice a day as needed. With ointments be careful to avoid the armpits and groin area. -Consider Dupixent as a long-term treatment option if topical treatments are ineffective. -Recommend re-scheduling for patch placement again.  Patch testing with the TRUE test patch panels.  Patches are best placed on a Monday with return to office on Wednesday and Friday of same week for readings.  Once patches are in place to do not get them wet.  You can take antihistamines while patches are in place.   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 -Nut panel is positive to peanut and walnuts -Continue avoidance of nuts in diet -Have access to self-injectable epinephrine (Epipen or AuviQ) 0.3mg  at all times.  Discussed new option of Neffy nasal spray epinephrine device; if interested in this option let me know and we can prescribe it.  -Follow emergency action plan in case of allergic reaction  Contrast Dye Allergy Reported itching after exposure to contrast dye used in imaging studies. -Continue protocol for reaction to contrast dye  Follow-up in 6 months or sooner if needed

## 2023-07-02 ENCOUNTER — Ambulatory Visit: Payer: MEDICAID | Admitting: Allergy

## 2023-07-04 ENCOUNTER — Encounter: Payer: MEDICAID | Admitting: Family

## 2023-07-06 ENCOUNTER — Other Ambulatory Visit (HOSPITAL_COMMUNITY): Payer: Self-pay

## 2023-07-06 ENCOUNTER — Encounter: Payer: MEDICAID | Admitting: Allergy

## 2023-07-08 ENCOUNTER — Other Ambulatory Visit: Payer: Self-pay | Admitting: Family Medicine

## 2023-07-08 ENCOUNTER — Encounter: Payer: Self-pay | Admitting: Family Medicine

## 2023-07-08 ENCOUNTER — Encounter: Payer: Self-pay | Admitting: Cardiology

## 2023-07-08 DIAGNOSIS — I152 Hypertension secondary to endocrine disorders: Secondary | ICD-10-CM

## 2023-07-08 DIAGNOSIS — G43409 Hemiplegic migraine, not intractable, without status migrainosus: Secondary | ICD-10-CM

## 2023-07-09 ENCOUNTER — Telehealth: Payer: Self-pay

## 2023-07-09 NOTE — Telephone Encounter (Signed)
 Pharmacy Patient Advocate Encounter   Received notification from CoverMyMeds that prior authorization for JARDIANCE is required/requested.   Insurance verification completed.   The patient is insured through [employer] [facility] [employer] .   PA required; PA submitted to above mentioned insurance via CoverMyMeds Key/confirmation #/EOC [id number]. Status is pending

## 2023-07-09 NOTE — Telephone Encounter (Signed)
 Spoke with pt over the phone and offered a sooner appt on 2/27 at 9 AM and he accepted the new appt slot.

## 2023-07-10 NOTE — Telephone Encounter (Signed)
 Pharmacy Patient Advocate Encounter  Received notification from [facility] that Prior Authorization for JARDIANCE has been APPROVED from 07/09/23 to 07/09/24   PA #/Case ID/Reference #: [id number]

## 2023-07-11 ENCOUNTER — Other Ambulatory Visit: Payer: Self-pay | Admitting: Family Medicine

## 2023-07-11 DIAGNOSIS — E118 Type 2 diabetes mellitus with unspecified complications: Secondary | ICD-10-CM

## 2023-07-12 ENCOUNTER — Encounter: Payer: Self-pay | Admitting: Family Medicine

## 2023-07-12 ENCOUNTER — Ambulatory Visit: Payer: MEDICAID | Admitting: Family Medicine

## 2023-07-12 ENCOUNTER — Encounter: Payer: Self-pay | Admitting: Cardiology

## 2023-07-12 ENCOUNTER — Ambulatory Visit: Payer: MEDICAID | Attending: Cardiology | Admitting: Cardiology

## 2023-07-12 VITALS — BP 99/71 | HR 122 | Resp 16 | Ht 67.0 in | Wt 250.0 lb

## 2023-07-12 DIAGNOSIS — E1165 Type 2 diabetes mellitus with hyperglycemia: Secondary | ICD-10-CM | POA: Diagnosis not present

## 2023-07-12 DIAGNOSIS — I10 Essential (primary) hypertension: Secondary | ICD-10-CM

## 2023-07-12 DIAGNOSIS — R072 Precordial pain: Secondary | ICD-10-CM

## 2023-07-12 DIAGNOSIS — Z794 Long term (current) use of insulin: Secondary | ICD-10-CM

## 2023-07-12 DIAGNOSIS — R000 Tachycardia, unspecified: Secondary | ICD-10-CM

## 2023-07-12 DIAGNOSIS — I959 Hypotension, unspecified: Secondary | ICD-10-CM

## 2023-07-12 NOTE — Progress Notes (Deleted)
    SUBJECTIVE:   CHIEF COMPLAINT / HPI:   Here for DM check and cards rec follow up. Did not go to cardiology appointment. Will plan to get ECHO today and refer to lipid clinic for more intensive therapy.  Also need to dc sumatriptan until his heart is checked out  PERTINENT  PMH / PSH: HTN, diabetes, migraines  OBJECTIVE:   There were no vitals taken for this visit.  ***  ASSESSMENT/PLAN:   No problem-specific Assessment & Plan notes found for this encounter.     Gerrit Heck, DO [facility]
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
## 2023-07-12 NOTE — Patient Instructions (Signed)
 Medication Instructions:  Your physician has recommended you make the following change in your medication:   Medication to take in the morning: hydrochlorothiazide and Jardiance  Medications to take in the evening: Amlodipine and Lisinopril    *If you need a refill on your cardiac medications before your next appointment, please call your pharmacy*  Lab Work: To be completed today: CMP, hemoglobin/ hematocrit, d-dimer, urine drug screen   If you have labs (blood work) drawn today and your tests are completely normal, you will receive your results only by: MyChart Message (if you have MyChart) OR A paper copy in the mail If you have any lab test that is abnormal or we need to change your treatment, we will call you to review the results.  Testing/Procedures: Your physician has requested that you have an echocardiogram. Echocardiography is a painless test that uses sound waves to create images of your heart. It provides your doctor with information about the size and shape of your heart and how well your heart's chambers and valves are working. This procedure takes approximately one hour. There are no restrictions for this procedure. Please do NOT wear cologne, perfume, aftershave, or lotions (deodorant is allowed). Please arrive 15 minutes prior to your appointment time.  Please note: We ask at that you not bring children with you during ultrasound (echo/ vascular) testing. Due to room size and safety concerns, children are not allowed in the ultrasound rooms during exams. Our front office staff cannot provide observation of children in our lobby area while testing is being conducted. An adult accompanying a patient to their appointment will only be allowed in the ultrasound room at the discretion of the ultrasound technician under special circumstances. We apologize for any inconvenience.  ------------------------------------------------------------------- Your physician has requested that  you have a coronary calcium score performed. This is not covered by insurance and will be an out-of-pocket cost of approximately $99.   Follow-Up: At [facility], you and your health needs are our priority.  As part of our continuing mission to provide you with exceptional heart care, we have created designated Provider Care Teams.  These Care Teams include your primary Cardiologist (physician) and Advanced Practice Providers (APPs -  Physician Assistants and Nurse Practitioners) who all work together to provide you with the care you need, when you need it.  We recommend signing up for the patient portal called "MyChart".  Sign up information is provided on this After Visit Summary.  MyChart is used to connect with patients for Virtual Visits (Telemedicine).  Patients are able to view lab/test results, encounter notes, upcoming appointments, etc.  Non-urgent messages can be sent to your provider as well.   To learn more about what you can do with MyChart, go to [email or web address].    Your next appointment:   3 week(s)  The format for your next appointment:   In Person  Provider:   Tessa Lerner, DO {

## 2023-07-13 ENCOUNTER — Other Ambulatory Visit: Payer: Self-pay

## 2023-07-13 DIAGNOSIS — I959 Hypotension, unspecified: Secondary | ICD-10-CM

## 2023-07-13 LAB — COMPREHENSIVE METABOLIC PANEL
ALT: 19 [IU]/L (ref 0–44)
AST: 21 [IU]/L (ref 0–40)
Albumin: 4.1 g/dL (ref 4.1–5.1)
Alkaline Phosphatase: 93 [IU]/L (ref 44–121)
BUN/Creatinine Ratio: 11 (ref 9–20)
BUN: 28 mg/dL — ABNORMAL HIGH (ref 6–24)
Bilirubin Total: 0.3 mg/dL (ref 0.0–1.2)
CO2: 20 mmol/L (ref 20–29)
Calcium: 9.6 mg/dL (ref 8.7–10.2)
Chloride: 91 mmol/L — ABNORMAL LOW (ref 96–106)
Creatinine, Ser: 2.5 mg/dL — ABNORMAL HIGH (ref 0.76–1.27)
Globulin, Total: 3.6 g/dL (ref 1.5–4.5)
Glucose: 407 mg/dL — ABNORMAL HIGH (ref 70–99)
Potassium: 5 mmol/L (ref 3.5–5.2)
Sodium: 131 mmol/L — ABNORMAL LOW (ref 134–144)
Total Protein: 7.7 g/dL (ref 6.0–8.5)
eGFR: 31 mL/min/{1.73_m2} — ABNORMAL LOW (ref >59)

## 2023-07-13 LAB — URINE DRUGS OF ABUSE SCREEN W ALC, ROUTINE (REF LAB)
Amphetamines, Urine: NEGATIVE ng/mL
Barbiturate Quant, Ur: NEGATIVE ng/mL
Benzodiazepine Quant, Ur: NEGATIVE ng/mL
Cannabinoid Quant, Ur: NEGATIVE ng/mL
Cocaine (Metab.): NEGATIVE ng/mL
Ethanol, Urine: NEGATIVE %
Methadone Screen, Urine: NEGATIVE ng/mL
Opiate Quant, Ur: NEGATIVE ng/mL
PCP Quant, Ur: NEGATIVE ng/mL
Propoxyphene: NEGATIVE ng/mL

## 2023-07-13 LAB — D-DIMER, QUANTITATIVE: D-DIMER: 0.29 mg{FEU}/L (ref 0.00–0.49)

## 2023-07-13 LAB — HEMOGLOBIN AND HEMATOCRIT, BLOOD
Hematocrit: 41.8 % (ref 37.5–51.0)
Hemoglobin: 14.1 g/dL (ref 13.0–17.7)

## 2023-07-14 ENCOUNTER — Telehealth: Payer: MEDICAID

## 2023-07-15 ENCOUNTER — Encounter: Payer: Self-pay | Admitting: Cardiology

## 2023-07-15 NOTE — Progress Notes (Deleted)
   Follow Up Note  RE: JIMMYLEE RATTERREE MRN: [id number] DOB: 08/25/1975 Date of Office Visit: 07/16/2023  Referring provider: Gerrit Heck, DO Primary care provider: Gerrit Heck, DO  History of Present Illness: I had the pleasure of seeing Truett Perna for a follow up visit at the [facility] on 07/15/2023. He is a 48 y.o. male, who is being followed for ***. Today he is here for patch test placement, given suspected history of contact dermatitis.   Diagnostics: TRUE Test patches placed.   Assessment and Plan: Axzel is a 48 y.o. male with: *** The patient was instructed regarding proper care of the patches for the next 48 hours. Do not get patches wet - avoid showering until the next visit. Do not engage in vigorous physical activity. Patient will follow up in 48 hours and 96 hours for patch readings.  It was my pleasure to see William today and participate in his care. Please feel free to contact me with any questions or concerns.  Sincerely,  Wyline Mood, DO Allergy & Immunology  [facility] office: [phone] [facility] office: [phone]

## 2023-07-16 ENCOUNTER — Telehealth: Payer: Self-pay

## 2023-07-16 ENCOUNTER — Other Ambulatory Visit (HOSPITAL_COMMUNITY): Payer: Self-pay

## 2023-07-16 ENCOUNTER — Ambulatory Visit: Payer: MEDICAID | Admitting: Allergy

## 2023-07-16 ENCOUNTER — Encounter: Payer: Self-pay | Admitting: Allergy

## 2023-07-16 DIAGNOSIS — L2389 Allergic contact dermatitis due to other agents: Secondary | ICD-10-CM

## 2023-07-16 NOTE — Telephone Encounter (Signed)
 Pharmacy Patient Advocate Encounter   Received notification from CoverMyMeds that prior authorization for VICTOZA is required/requested.   Insurance verification completed.   The patient is insured through [facility] .   PA required; PA submitted to above mentioned insurance via CoverMyMeds Key/confirmation #/EOC [employer]. Status is pending

## 2023-07-17 NOTE — Telephone Encounter (Signed)
 Pharmacy Patient Advocate Encounter   Got it approved for brand!   Received notification from [facility] that Prior Authorization for Verdis Prime [facility]) has been APPROVED from 07/16/23 to 07/15/24   PA #/Case ID/Reference #: [id number]

## 2023-07-18 ENCOUNTER — Encounter: Payer: MEDICAID | Admitting: Allergy

## 2023-07-18 ENCOUNTER — Telehealth: Payer: MEDICAID | Admitting: Physician Assistant

## 2023-07-18 DIAGNOSIS — M549 Dorsalgia, unspecified: Secondary | ICD-10-CM

## 2023-07-18 MED ORDER — NAPROXEN 500 MG PO TABS: 500.0000 mg | ORAL_TABLET | Freq: Two times a day (BID) | ORAL | 0 refills | Status: DC

## 2023-07-18 MED ORDER — TIZANIDINE HCL 2 MG PO TABS: 2.0000 mg | ORAL_TABLET | Freq: Three times a day (TID) | ORAL | 0 refills | Status: DC | PRN

## 2023-07-18 NOTE — Addendum Note (Signed)
 Addended by: Waldon Merl on: 07/18/2023 03:56 PM   Modules accepted: Orders, Level of Service

## 2023-07-18 NOTE — Progress Notes (Signed)
 I have spent 5 minutes in review of e-visit questionnaire, review and updating patient chart, medical decision making and response to patient.   Piedad Climes, PA-C

## 2023-07-18 NOTE — Progress Notes (Addendum)

## 2023-07-20 ENCOUNTER — Encounter: Payer: Self-pay | Admitting: Cardiology

## 2023-07-20 ENCOUNTER — Encounter: Payer: MEDICAID | Admitting: Internal Medicine

## 2023-07-21 ENCOUNTER — Telehealth: Payer: MEDICAID

## 2023-07-21 DIAGNOSIS — M545 Low back pain, unspecified: Secondary | ICD-10-CM

## 2023-07-22 NOTE — Progress Notes (Signed)
  Because your pain has not improved from your last Evisit on 07/18/23, I feel your condition warrants further evaluation and I recommend that you be seen in a face-to-face visit.   NOTE: There will be NO CHARGE for this E-Visit   If you are having a true medical emergency, please call 911.     For an urgent face to face visit, [place] has multiple urgent care centers for your convenience.  Click the link below for the full list of locations and hours, walk-in wait times, appointment scheduling options and driving directions:  Urgent Care - [place], [place], [place], [place], [place], [place]  [place]     Your MyChart E-visit questionnaire answers were reviewed by a board certified advanced clinical practitioner to complete your personal care plan based on your specific symptoms.    Thank you for using e-Visits.

## 2023-07-23 ENCOUNTER — Telehealth: Payer: MEDICAID | Admitting: Physician Assistant

## 2023-07-23 DIAGNOSIS — M549 Dorsalgia, unspecified: Secondary | ICD-10-CM

## 2023-07-23 DIAGNOSIS — M62830 Muscle spasm of back: Secondary | ICD-10-CM

## 2023-07-23 DIAGNOSIS — Z760 Encounter for issue of repeat prescription: Secondary | ICD-10-CM

## 2023-07-23 MED ORDER — TIZANIDINE HCL 2 MG PO TABS: 2.0000 mg | ORAL_TABLET | Freq: Three times a day (TID) | ORAL | 0 refills | Status: AC | PRN

## 2023-07-23 NOTE — Progress Notes (Signed)
   Thank you for the details you included in the comment boxes. Those details are very helpful in determining the best course of treatment for you and help [place] to provide the best care. Because you are requesting a medication refill, we recommend that you schedule a Virtual Urgent Care video visit in order for the provider to better assess what is going on.  The provider will be able to give you a more accurate diagnosis and treatment plan if we can more freely discuss your symptoms and with the addition of a virtual examination.   If you change your visit to a video visit, we will bill your insurance (similar to an office visit) and you will not be charged for this e-Visit. You will be able to stay at home and speak with the first available [facility] advanced practice provider. The link to do a video visit is in the drop down Menu tab of your Welcome screen in MyChart.      I have spent 5 minutes in review of e-visit questionnaire, review and updating patient chart, medical decision making and response to patient.   Margaretann Loveless, PA-C
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
## 2023-07-23 NOTE — Patient Instructions (Addendum)
 Marcus [name], thank you for joining Margaretann Loveless, PA-C for today's virtual visit.  While this provider is not your primary care provider (PCP), if your PCP is located in our provider database this encounter information will be shared with them immediately following your visit.   A [facility] MyChart account gives you access to today's visit and all your visits, tests, and labs performed at [facility] " click here if you don't have a [facility] MyChart account or go to [email or web address]  Consent: (Patient) Marcus [name] provided verbal consent for this virtual visit at the beginning of the encounter.  Current Medications:  Current Outpatient Medications:    amLODipine (NORVASC) 10 MG tablet, Take 10 mg by mouth daily., Disp: , Rfl:    Blood Glucose Monitoring Suppl (ACCU-CHEK GUIDE) w/Device KIT, USE TO TEST BLOOD SUGAR THREE TIMES DAILY WITH MEALS AS NEEDED, Disp: 1 kit, Rfl: 0   Continuous Blood Gluc Receiver (FREESTYLE LIBRE 2 READER) DEVI, Use as directed to check blood sugar continuously, Disp: 1 each, Rfl: 11   Continuous Blood Gluc Sensor (FREESTYLE LIBRE 2 SENSOR) MISC, Use as directed to check blood sugar continuously, Disp: 1 each, Rfl: 11   empagliflozin (JARDIANCE) 25 MG TABS tablet, Take 1 tablet (25 mg total) by mouth daily., Disp: 90 tablet, Rfl: 3   EPINEPHrine (EPIPEN 2-PAK) 0.3 mg/0.3 mL IJ SOAJ injection, Inject 0.3 mg into the muscle as needed for anaphylaxis., Disp: 2 each, Rfl: 2   ezetimibe (ZETIA) 10 MG tablet, Take 1 tablet (10 mg total) by mouth daily., Disp: 90 tablet, Rfl: 3   famotidine (PEPCID) 20 MG tablet, Take 1 tablet (20 mg total) by mouth 2 (two) times daily., Disp: 20 tablet, Rfl: 0   fluticasone (FLONASE) 50 MCG/ACT nasal spray, Place 2 sprays into both nostrils daily., Disp: 16 g, Rfl: 6   glucose blood test strip, Please use to check sugars before each meal and prior to bedtime., Disp: 100 each, Rfl: 12    hydrochlorothiazide (HYDRODIURIL) 25 MG tablet, Take 1 tablet (25 mg total) by mouth daily., Disp: 30 tablet, Rfl: 3   insulin aspart (NOVOLOG FLEXPEN) 100 UNIT/ML FlexPen, Inject 5 Units into the skin daily. Prior to largest meal. (Patient not taking: Reported on 07/12/2023), Disp: 3 mL, Rfl: 3   insulin glargine (LANTUS SOLOSTAR) 100 UNIT/ML Solostar Pen, Inject 45 Units into the skin at bedtime., Disp: 15 mL, Rfl: 2   Insulin Pen Needle (B-D ULTRAFINE III SHORT PEN) 31G X 8 MM MISC, USE AS DIRECTED TO INJECT INSULIN ONCE DAILY, Disp: 100 each, Rfl: 0   Insulin Pen Needle (B-D ULTRAFINE III SHORT PEN) 31G X 8 MM MISC, USE AS DIRECTED TO INJECT INSULIN, Disp: 100 each, Rfl: 0   Lancets Misc. (ACCU-CHEK FASTCLIX LANCET) KIT, 1 Units by Does not apply route as needed., Disp: 1 kit, Rfl: 2   liraglutide (VICTOZA) 18 MG/3ML SOPN, ADMINISTER 1.8 MG UNDER THE SKIN DAILY, Disp: 9 mL, Rfl: 0   lisinopril (ZESTRIL) 40 MG tablet, TAKE 1 TABLET(40 MG) BY MOUTH DAILY, Disp: 30 tablet, Rfl: 0   metFORMIN (GLUCOPHAGE) 1000 MG tablet, Take 1 tablet (1,000 mg total) by mouth 2 (two) times daily with a meal. TAKE 1 TABLET(1000 MG) BY MOUTH TWICE DAILY WITH A MEAL Strength: 1,000 mg, Disp: 180 tablet, Rfl: 3   Multiple Vitamin (MULTIVITAMIN ADULT PO), Take 1 tablet by mouth daily., Disp: , Rfl:    naproxen (NAPROSYN) 500 MG tablet, Take 1 tablet (  500 mg total) by mouth 2 (two) times daily with a meal., Disp: 20 tablet, Rfl: 0   pimecrolimus (ELIDEL) 1 % cream, Apply topically 2 (two) times daily as needed., Disp: 100 g, Rfl: 2   rosuvastatin (CRESTOR) 40 MG tablet, Take 1 tablet (40 mg total) by mouth daily., Disp: 90 tablet, Rfl: 3   senna (SENOKOT) 8.6 MG TABS tablet, Take 1 tablet (8.6 mg total) by mouth daily., Disp: 30 tablet, Rfl: 0   tacrolimus (PROTOPIC) 0.1 % ointment, Apply 1 application topically 2 times day as needed for rash., Disp: 100 g, Rfl: 1   tiZANidine (ZANAFLEX) 2 MG tablet, Take 1 tablet (2 mg  total) by mouth every 8 (eight) hours as needed for up to 5 days for muscle spasms., Disp: 15 tablet, Rfl: 0   vitamin B-12 (CYANOCOBALAMIN) 100 MCG tablet, Take 100 mcg by mouth daily as needed., Disp: , Rfl:    zinc gluconate 50 MG tablet, Take 50 mg by mouth daily., Disp: , Rfl:    Medications ordered in this encounter:  Meds ordered this encounter  Medications   tiZANidine (ZANAFLEX) 2 MG tablet    Sig: Take 1 tablet (2 mg total) by mouth every 8 (eight) hours as needed for up to 5 days for muscle spasms.    Dispense:  15 tablet    Refill:  0    Supervising Provider:   Merrilee Jansky [[id number]]     *If you need refills on other medications prior to your next appointment, please contact your pharmacy*  Follow-Up: Call back or seek an in-person evaluation if the symptoms worsen or if the condition fails to improve as anticipated.  [facility] [phone]  Other Instructions  - Keep scheduled appointment with PCP on 07/26/23 at 09:25 am at [facility]   If you have been instructed to have an in-person evaluation today at a local Urgent Care facility, please use the link below. It will take you to a list of all of our available [facility], including address, phone number and hours of operation. Please do not delay care.  [facility]  If you or a family member do not have a primary care provider, use the link below to schedule a visit and establish care. When you choose a [facility] primary care physician or advanced practice provider, you gain a long-term partner in health. Find a Primary Care Provider  Learn more about [facility]'s in-office and virtual care options: [facility] - Get Care Now

## 2023-07-24 LAB — BASIC METABOLIC PANEL
BUN/Creatinine Ratio: 13 (ref 9–20)
BUN: 34 mg/dL — ABNORMAL HIGH (ref 6–24)
CO2: 22 mmol/L (ref 20–29)
Calcium: 9.6 mg/dL (ref 8.7–10.2)
Chloride: 94 mmol/L — ABNORMAL LOW (ref 96–106)
Creatinine, Ser: 2.55 mg/dL — ABNORMAL HIGH (ref 0.76–1.27)
Glucose: 468 mg/dL — ABNORMAL HIGH (ref 70–99)
Potassium: 5 mmol/L (ref 3.5–5.2)
Sodium: 132 mmol/L — ABNORMAL LOW (ref 134–144)
eGFR: 30 mL/min/{1.73_m2} — ABNORMAL LOW (ref >59)

## 2023-07-24 NOTE — Progress Notes (Unsigned)
    SUBJECTIVE:   CHIEF COMPLAINT / HPI:   Here for DM check and cards rec follow up. Victoza has been approved finally. Need to recheck kidneys  Patient is having difficulty obtaining victoza as no nearby stores have it in stock. Given his ASCVD risk, as well as his kidney function we discussed alternative GLP-1 therapy which would have greater efficacy.   The 10-year ASCVD risk score (Arnett DK, et al., 2019) is: 15.4%   Values used to calculate the score:     Age: 48 years     Sex: Male     Is Non-Hispanic African American: Yes     Diabetic: Yes     Tobacco smoker: No     Systolic Blood Pressure: 116 mmHg     Is BP treated: Yes     HDL Cholesterol: 30 mg/dL     Total Cholesterol: 267 mg/dL   Initial blood pressure reading in the office was elevated, but recheck was appropriate. Patient has been seen by cardiology and has upcoming ECHO appointment as well as follow up with them.  Advised the patient that he should not take sumatriptan or naproxen at this time due to his poor renal function.   PERTINENT  PMH / PSH: HTN, diabetes, migraines  OBJECTIVE:   BP 116/79   Pulse (!) 110   Ht 5\' 7"  (1.702 m)   Wt 251 lb 3.2 oz (113.9 kg)   SpO2 100%   BMI 39.34 kg/m   General: A&O, NAD HEENT: No sign of trauma, EOM grossly intact. Attempted fundoscopic exam, no gross abnormalities seen, but exam was limited due to skill of practioner. Patient sees ophtalmology separately.  Cardiac: RRR, no m/r/g Respiratory: CTAB, normal WOB, no w/c/r GI: Soft, NTTP, non-distended  Extremities: NTTP, no peripheral edema.  ASSESSMENT/PLAN:   Hypertension associated with diabetes (HCC) At goal today. No changes to medications.   Type 2 diabetes mellitus with complication (HCC) Discontinued victoza, prescribed mounjaro 2.5mg  starting dose. Patient will follow up with me in 4 weeks to assess for adverse reaction at which time we will increase to the 5 mg dose. We also decreased his jardiance to  10 mg to avoid nephrotoxic effects.   CKD stage 3 due to type 2 diabetes mellitus (HCC) Decreased jardiance as above. Also referred patient to nephrology for further management. Will check a BMET at next visit, patient left before one could be collected today. Also made referral to nurse coordination service given patient has multiple chronic conditions and struggles to make appointments.     Gerrit Heck, DO [facility]

## 2023-07-26 ENCOUNTER — Encounter: Payer: Self-pay | Admitting: Family Medicine

## 2023-07-26 ENCOUNTER — Ambulatory Visit (INDEPENDENT_AMBULATORY_CARE_PROVIDER_SITE_OTHER): Payer: MEDICAID | Admitting: Family Medicine

## 2023-07-26 VITALS — BP 116/79 | HR 110 | Ht 67.0 in | Wt 251.2 lb

## 2023-07-26 DIAGNOSIS — N183 Chronic kidney disease, stage 3 unspecified: Secondary | ICD-10-CM

## 2023-07-26 DIAGNOSIS — I152 Hypertension secondary to endocrine disorders: Secondary | ICD-10-CM | POA: Diagnosis not present

## 2023-07-26 DIAGNOSIS — E1122 Type 2 diabetes mellitus with diabetic chronic kidney disease: Secondary | ICD-10-CM | POA: Diagnosis not present

## 2023-07-26 DIAGNOSIS — E1159 Type 2 diabetes mellitus with other circulatory complications: Secondary | ICD-10-CM | POA: Diagnosis not present

## 2023-07-26 DIAGNOSIS — E118 Type 2 diabetes mellitus with unspecified complications: Secondary | ICD-10-CM

## 2023-07-26 MED ORDER — LIRAGLUTIDE 18 MG/3ML ~~LOC~~ SOPN: 1.8000 mg | PEN_INJECTOR | Freq: Every day | SUBCUTANEOUS | 3 refills | Status: DC

## 2023-07-26 MED ORDER — MOUNJARO 2.5 MG/0.5ML ~~LOC~~ SOAJ: 2.5000 mg | SUBCUTANEOUS | 0 refills | Status: DC

## 2023-07-26 MED ORDER — EMPAGLIFLOZIN 10 MG PO TABS: 10.0000 mg | ORAL_TABLET | Freq: Every day | ORAL | 0 refills | Status: DC

## 2023-07-26 NOTE — Assessment & Plan Note (Addendum)
 Decreased jardiance as above. Also referred patient to nephrology for further management. Will check a BMET at next visit, patient left before one could be collected today. Also made referral to nurse coordination service given patient has multiple chronic conditions and struggles to make appointments.

## 2023-07-26 NOTE — Assessment & Plan Note (Signed)
 Discontinued victoza, prescribed mounjaro 2.5mg  starting dose. Patient will follow up with me in 4 weeks to assess for adverse reaction at which time we will increase to the 5 mg dose. We also decreased his jardiance to 10 mg to avoid nephrotoxic effects.

## 2023-07-26 NOTE — Assessment & Plan Note (Signed)
 At goal today. No changes to medications.

## 2023-07-26 NOTE — Addendum Note (Signed)
 Addended by: Gerrit Heck on: 07/26/2023 10:29 AM   Modules accepted: Orders

## 2023-07-26 NOTE — Patient Instructions (Addendum)
 It was wonderful to see you today!  Today we checked your blood pressure and your kidneys. I am very concerned about your kidney function, as your risk is very high right now. I have made a referral to see a kidney doctor to talk about what changes can be made to your medicines to protect your kidneys. Your blood pressure today looks good, so we will continue with your current medicines unless your cardiologist or nephrologist make any changes.   I did make changes to the medicines you take for your diabetes. Since you have been having trouble with your victoza, I have switched you to a medicine called mounjaro. Greggory Keen is the same type of medicine as Victoza, but you only need to take it once a week. Pick a day that will stick out in your head to take it to help you remember. While we are working toward a dose that works, you will see me every four weeks to increase the dose and check for side effects. Your next appointment is listed below.   Please call [phone] with any questions about today's appointment.   If you need any additional refills, please call your pharmacy before calling the office.  Gerrit Heck, DO Family Medicine

## 2023-07-27 ENCOUNTER — Telehealth: Payer: Self-pay

## 2023-07-27 DIAGNOSIS — E118 Type 2 diabetes mellitus with unspecified complications: Secondary | ICD-10-CM

## 2023-07-27 NOTE — Telephone Encounter (Signed)
 Pharmacy Patient Advocate Encounter   Received notification from CoverMyMeds that prior authorization for [facility] is required/requested.   Insurance verification completed.   The patient is insured through [name] .   PA required; PA submitted to above mentioned insurance via CoverMyMeds Key/confirmation #/EOC [id number]. Status is pending

## 2023-07-30 ENCOUNTER — Telehealth: Payer: Self-pay

## 2023-07-30 MED ORDER — SEMAGLUTIDE(0.25 OR 0.5MG/DOS) 2 MG/1.5ML ~~LOC~~ SOPN: 0.2500 mg | PEN_INJECTOR | SUBCUTANEOUS | 0 refills | Status: DC

## 2023-07-30 NOTE — Addendum Note (Signed)
 Addended by: Gerrit Heck on: 07/30/2023 09:24 AM   Modules accepted: Orders

## 2023-07-30 NOTE — Telephone Encounter (Signed)
 Pharmacy Patient Advocate Encounter  Received notification from [facility] that Prior Authorization for [facility] has been DENIED.  Full denial letter will be uploaded to the media tab. See denial reason below.     Patient may want to call around to see what pharmacies have brand Victoza in stock. Our pharmacies should have this available or I'm sure can order it.   PA #/Case ID/Reference #: [id number]

## 2023-07-30 NOTE — Telephone Encounter (Signed)
 Pharmacy Patient Advocate Encounter   Received notification from CoverMyMeds that prior authorization for [facility] is required/requested.   Insurance verification completed.   The patient is insured through [facility] .   PA required; PA submitted to above mentioned insurance via CoverMyMeds Key/confirmation #/EOC [id number]. Status is pending

## 2023-07-30 NOTE — Telephone Encounter (Signed)
 Mounjaro denied, will switch to ozempic which is preferred. Will send MyChart communication to update the patient.

## 2023-07-31 NOTE — Telephone Encounter (Signed)
 Coronary calcium score was ordered for the workup of his precordial pain.  Since he is not able to afford it please proceed forward with scheduling a Lexiscan stress test for precordial pain.  And reschedule a follow-up office visit after the stress test is complete  Tessa Lerner, DO, [facility]

## 2023-07-31 NOTE — Telephone Encounter (Signed)
 Pharmacy Patient Advocate Encounter  Received notification from [facility] that Prior Authorization for OZEMPIC (ALL STRENGTHS) has been APPROVED from 07/30/23 to 07/30/24

## 2023-08-01 ENCOUNTER — Encounter: Payer: Self-pay | Admitting: Family Medicine

## 2023-08-01 ENCOUNTER — Telehealth: Payer: Self-pay | Admitting: *Deleted

## 2023-08-01 NOTE — Progress Notes (Signed)
 Complex Care Management Note  Care Guide Note 08/01/2023 Name: ROSHARD REZABEK MRN: [id number] DOB: [birth year]  Katherine Basset is a 48 y.o. year old male who sees Gerrit Heck, DO for primary care. I reached out to Katherine Basset by phone today to offer complex care management services.  Mr. Hogen was given information about Complex Care Management services today including:   The Complex Care Management services include support from the care team which includes your Nurse Care Manager, Clinical Social Worker, or Pharmacist.  The Complex Care Management team is here to help remove barriers to the health concerns and goals most important to you. Complex Care Management services are voluntary, and the patient may decline or stop services at any time by request to their care team member.   Complex Care Management Consent Status: Patient agreed to services and verbal consent obtained.   Follow up plan:  Telephone appointment with complex care management team member scheduled for:  3/27  Encounter Outcome:  Patient Scheduled  Gwenevere Ghazi  [facility]  [facility], [facility] Guide  Direct Dial: [phone]  Fax [phone]

## 2023-08-01 NOTE — Telephone Encounter (Signed)
 Order changed to a Lexiscan stress test. Message sent to Thea Alken and confirmation has been sent that the pt's appt has been moved to a lexiscan stress on the same day as his scheduled echo.

## 2023-08-03 ENCOUNTER — Encounter (HOSPITAL_COMMUNITY): Payer: Self-pay

## 2023-08-06 ENCOUNTER — Encounter: Payer: Self-pay | Admitting: Cardiology

## 2023-08-06 ENCOUNTER — Ambulatory Visit (HOSPITAL_COMMUNITY): Payer: MEDICAID | Attending: Cardiology

## 2023-08-06 ENCOUNTER — Other Ambulatory Visit: Payer: Self-pay | Admitting: Family Medicine

## 2023-08-06 ENCOUNTER — Ambulatory Visit (HOSPITAL_COMMUNITY): Payer: MEDICAID

## 2023-08-06 ENCOUNTER — Encounter: Payer: Self-pay | Admitting: Family Medicine

## 2023-08-06 ENCOUNTER — Encounter: Payer: Self-pay | Admitting: Podiatry

## 2023-08-06 DIAGNOSIS — R072 Precordial pain: Secondary | ICD-10-CM | POA: Insufficient documentation

## 2023-08-06 DIAGNOSIS — I152 Hypertension secondary to endocrine disorders: Secondary | ICD-10-CM

## 2023-08-06 LAB — ECHOCARDIOGRAM COMPLETE
Area-P 1/2: 7.09 cm2
S' Lateral: 2.6 cm

## 2023-08-06 MED ORDER — PERFLUTREN LIPID MICROSPHERE
1.0000 mL | INTRAVENOUS | Status: AC | PRN
  Administered 2023-08-06: 1 mL via INTRAVENOUS

## 2023-08-06 MED ORDER — LISINOPRIL 40 MG PO TABS: 40.0000 mg | ORAL_TABLET | Freq: Every day | ORAL | 0 refills | Status: DC

## 2023-08-06 NOTE — Addendum Note (Signed)
 Addended by: Gerrit Heck on: 08/06/2023 02:03 PM   Modules accepted: Orders

## 2023-08-07 ENCOUNTER — Telehealth: Payer: Self-pay

## 2023-08-07 ENCOUNTER — Other Ambulatory Visit: Payer: Self-pay | Admitting: Family Medicine

## 2023-08-07 ENCOUNTER — Ambulatory Visit: Payer: MEDICAID | Admitting: Cardiology

## 2023-08-07 DIAGNOSIS — R079 Chest pain, unspecified: Secondary | ICD-10-CM

## 2023-08-07 NOTE — Telephone Encounter (Signed)
 Spoke with pt and discussed Dr. Emelda Brothers recommendation of rescheduling his appt today d/t stress test still pending. Pt had also sent a [facility] message last night stating he was sick as well. Appt is rescheduled for 4/16.

## 2023-08-09 ENCOUNTER — Encounter (HOSPITAL_COMMUNITY): Payer: Self-pay

## 2023-08-10 ENCOUNTER — Ambulatory Visit: Payer: Self-pay

## 2023-08-10 NOTE — Patient Outreach (Signed)
 Care Coordination   Follow Up Visit Note   08/10/2023 Name: Marcus [name] MRN: [id number] DOB: April 08, 1976  Marcus [name] is a 48 y.o. year old male who sees Gerrit Heck, DO for primary care. I spoke with  Marcus [name] by phone today.  What matters to the patients health and wellness today?  I had a conversation with Marcus [name] today regarding his diabetes management. He reported that he is doing well, with his latest A1C level recorded at 7.6. He indicated that he is conscientious about his diet and has made adjustments to his daily activities.  Additionally, Marcus [name] attended a recent cardiology appointment, where he was informed that his cardiac health is stable. He utilizes Clinical cytogeneticist to maintain communication with his healthcare providers and remains proactive in his health management.  We reached out to his insurance provider, [employer], which confirmed that [employer] at [phone] serves as his designated medical and mental health provider. Marcus [name] has requested that I contact them to relay his phone number and the information for his primary medical physician, facilitating coordination of his medications and overall care. The contact number provided during our call today is [phone].Helped by Merry [facility] Addressed             This Visit's Progress    Patient Stated- Conneect the patient with [facility] provider       Care Coordination Interventions: Active listening / Reflection utilized  Problem Solving /Task Center strategies reviewed -called customer service  [phone] - [facility] will help with needs         SDOH assessments and interventions completed:  Yes  SDOH Interventions Today    Flowsheet Row Most Recent Value  SDOH Interventions   Food Insecurity Interventions Intervention Not Indicated  Housing Interventions Intervention Not Indicated  Transportation Interventions  Intervention Not Indicated  Utilities Interventions Intervention Not Indicated        Care Coordination Interventions:  Yes, provided   Interventions Today    Flowsheet Row Most Recent Value  Chronic Disease   Chronic disease during today's visit Other  [help with [employer] connection]  General Interventions   General Interventions Discussed/Reviewed Communication with  Communication with --  [[employer] relations  Diana]        Follow up plan: Follow up call scheduled for The patient will connect with me on mychart to schedule appointment.    Encounter Outcome:  Patient Visit Completed   Juanell Fairly RN, BSN, [facility]   [facility], [facility]  Care Coordinator Phone: [phone]

## 2023-08-10 NOTE — Patient Instructions (Signed)
 Visit Information  Thank you for taking time to visit with me today. Please don't hesitate to contact me if I can be of assistance to you.   Following are the goals we discussed today:   Goals Addressed             This Visit's Progress    Patient Stated- Conneect the patient with [facility] provider       Care Coordination Interventions: Active listening / Reflection utilized  Problem Solving /Task Center strategies reviewed -called customer service  [phone] - [facility] will help with needs          Please call the care guide team at [phone] if you need to cancel or reschedule your appointment.   If you are experiencing a Mental Health or Behavioral Health Crisis or need someone to talk to, please call 1-800-273-TALK (toll free, 24 hour hotline)  Patient verbalizes understanding of instructions and care plan provided today and agrees to view in MyChart. Active MyChart status and patient understanding of how to access instructions and care plan via MyChart confirmed with patient.     Juanell Fairly RN, BSN, [facility]  [facility], [facility]  Care Coordinator Phone: [phone]

## 2023-08-13 ENCOUNTER — Ambulatory Visit (HOSPITAL_COMMUNITY): Payer: MEDICAID

## 2023-08-15 ENCOUNTER — Other Ambulatory Visit (HOSPITAL_BASED_OUTPATIENT_CLINIC_OR_DEPARTMENT_OTHER): Payer: MEDICAID

## 2023-08-16 ENCOUNTER — Encounter (HOSPITAL_COMMUNITY): Payer: Self-pay

## 2023-08-17 ENCOUNTER — Encounter: Payer: Self-pay | Admitting: Podiatry

## 2023-08-17 ENCOUNTER — Ambulatory Visit: Payer: MEDICAID | Admitting: Podiatry

## 2023-08-17 DIAGNOSIS — B351 Tinea unguium: Secondary | ICD-10-CM

## 2023-08-17 DIAGNOSIS — M79676 Pain in unspecified toe(s): Secondary | ICD-10-CM

## 2023-08-17 DIAGNOSIS — E1140 Type 2 diabetes mellitus with diabetic neuropathy, unspecified: Secondary | ICD-10-CM

## 2023-08-17 DIAGNOSIS — E1149 Type 2 diabetes mellitus with other diabetic neurological complication: Secondary | ICD-10-CM

## 2023-08-17 NOTE — Progress Notes (Signed)
 Complaint:  Visit Type: Patient returns to my office for continued preventative foot care services. Complaint: Patient states" my nails have grown long and thick and become painful to walk and wear shoes" Patient has been diagnosed with DM with no foot complications. The patient presents for preventative foot care services. No changes to ROS  Podiatric Exam: Vascular: dorsalis pedis and posterior tibial pulses are palpable bilateral. Capillary return is immediate. Temperature gradient is WNL. Skin turgor WNL  Sensorium: Normal Semmes Weinstein monofilament test. Normal tactile sensation bilaterally. Nail Exam: Pt has thick disfigured discolored nails with subungual debris noted bilateral entire nail hallux through fifth toenails Ulcer Exam: There is no evidence of ulcer or pre-ulcerative changes or infection. Orthopedic Exam: Muscle tone and strength are WNL. No limitations in general ROM. No crepitus or effusions noted. Foot type and digits show no abnormalities. Bony prominences are unremarkable. Skin: No Porokeratosis. No infection or ulcers  Diagnosis:  Onychomycosis, , Pain in right toe, pain in left toes  Treatment & Plan Procedures and Treatment: Consent by patient was obtained for treatment procedures.   Debridement of mycotic and hypertrophic toenails, 1 through 5 bilateral and clearing of subungual debris using a nail nipper and then dremel tool. . No ulceration, no infection noted.  Return Visit-Office Procedure: Patient instructed to return to the office for a follow up visit 3  months for continued evaluation and treatment.    Helane Gunther DPM

## 2023-08-19 ENCOUNTER — Other Ambulatory Visit: Payer: Self-pay | Admitting: Family Medicine

## 2023-08-19 DIAGNOSIS — E118 Type 2 diabetes mellitus with unspecified complications: Secondary | ICD-10-CM

## 2023-08-23 ENCOUNTER — Ambulatory Visit: Payer: Self-pay | Admitting: Family Medicine

## 2023-08-24 ENCOUNTER — Encounter (HOSPITAL_COMMUNITY): Payer: Self-pay

## 2023-08-24 ENCOUNTER — Ambulatory Visit (HOSPITAL_COMMUNITY): Payer: MEDICAID

## 2023-08-27 ENCOUNTER — Ambulatory Visit: Payer: MEDICAID | Admitting: Cardiology

## 2023-08-27 ENCOUNTER — Encounter: Payer: Self-pay | Admitting: Family Medicine

## 2023-08-29 ENCOUNTER — Telehealth: Payer: Self-pay

## 2023-08-29 ENCOUNTER — Encounter: Payer: Self-pay | Admitting: Cardiology

## 2023-08-29 ENCOUNTER — Ambulatory Visit: Payer: MEDICAID | Attending: Cardiology | Admitting: Cardiology

## 2023-08-29 VITALS — BP 120/88 | HR 108 | Resp 16 | Ht 67.0 in | Wt 249.6 lb

## 2023-08-29 DIAGNOSIS — I10 Essential (primary) hypertension: Secondary | ICD-10-CM | POA: Diagnosis not present

## 2023-08-29 DIAGNOSIS — I2584 Coronary atherosclerosis due to calcified coronary lesion: Secondary | ICD-10-CM

## 2023-08-29 DIAGNOSIS — I2510 Atherosclerotic heart disease of native coronary artery without angina pectoris: Secondary | ICD-10-CM

## 2023-08-29 DIAGNOSIS — E1169 Type 2 diabetes mellitus with other specified complication: Secondary | ICD-10-CM

## 2023-08-29 DIAGNOSIS — E118 Type 2 diabetes mellitus with unspecified complications: Secondary | ICD-10-CM | POA: Diagnosis not present

## 2023-08-29 DIAGNOSIS — I152 Hypertension secondary to endocrine disorders: Secondary | ICD-10-CM

## 2023-08-29 DIAGNOSIS — E1159 Type 2 diabetes mellitus with other circulatory complications: Secondary | ICD-10-CM

## 2023-08-29 DIAGNOSIS — R072 Precordial pain: Secondary | ICD-10-CM | POA: Diagnosis not present

## 2023-08-29 DIAGNOSIS — E785 Hyperlipidemia, unspecified: Secondary | ICD-10-CM

## 2023-08-29 MED ORDER — EZETIMIBE 10 MG PO TABS
10.0000 mg | ORAL_TABLET | Freq: Every day | ORAL | Status: DC
Start: 2023-08-29 — End: 2023-09-10

## 2023-08-29 MED ORDER — EMPAGLIFLOZIN 10 MG PO TABS: 10.0000 mg | ORAL_TABLET | Freq: Every day | ORAL | Status: DC

## 2023-08-29 MED ORDER — METOPROLOL SUCCINATE ER 50 MG PO TB24: 50.0000 mg | ORAL_TABLET | Freq: Every day | ORAL | 3 refills | Status: DC
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
## 2023-08-29 NOTE — Patient Instructions (Addendum)
 Medication Instructions:  Your physician has recommended you make the following change in your medication:   START Metoprolol Succinate (Toprol-XL) 50 mg once daily in the morning   HOLD if systolic blood pressure (top number) is less than 100 and/ or heart rate is less than 55.   Restart Jardiance 10 mg once daily in the morning   Restart Ezetimibe (Zetia) 10 mg once daily in the morning   Take Amlodipine (Norvasc) 10 mg in the evening   *If you need a refill on your cardiac medications before your next appointment, please call your pharmacy*  Lab Work: None ordered today. If you have labs (blood work) drawn today and your tests are completely normal, you will receive your results only by: MyChart Message (if you have MyChart) OR A paper copy in the mail If you have any lab test that is abnormal or we need to change your treatment, we will call you to review the results.  Testing/Procedures: None ordered today.  Follow-Up: At [facility], you and your health needs are our priority.  As part of our continuing mission to provide you with exceptional heart care, we have created designated Provider Care Teams.  These Care Teams include your primary Cardiologist (physician) and Advanced Practice Providers (APPs -  Physician Assistants and Nurse Practitioners) who all work together to provide you with the care you need, when you need it.  We recommend signing up for the patient portal called "MyChart".  Sign up information is provided on this After Visit Summary.  MyChart is used to connect with patients for Virtual Visits (Telemedicine).  Patients are able to view lab/test results, encounter notes, upcoming appointments, etc.  Non-urgent messages can be sent to your provider as well.   To learn more about what you can do with MyChart, go to [email or web address].    Your next appointment:   6 month(s)  The format for your next appointment:   In Person  Provider:   Olinda Bertrand,  [facility]  Other Instructions   1st Floor: - Lobby - Registration  - Pharmacy  - Lab - Cafe  2nd Floor: - PV Lab - Diagnostic Testing (echo, CT, nuclear med)  3rd Floor: - Vacant  4th Floor: - TCTS (cardiothoracic surgery) - AFib Clinic - Structural Heart Clinic - Vascular Surgery  - Vascular Ultrasound  5th Floor: - [facility] (general and EP) - Clinical Pharmacy for coumadin, hypertension, lipid, weight-loss medications, and med management appointments    Valet parking services will be available as well.

## 2023-08-30 ENCOUNTER — Ambulatory Visit: Payer: MEDICAID | Admitting: Family Medicine

## 2023-08-30 NOTE — Progress Notes (Deleted)
    SUBJECTIVE:   CHIEF COMPLAINT / HPI:   Patient had AKI at last visit, attempted to start GLP-1, had issues with approval. Needs urine acr and bmp today Will also need foot exam  PERTINENT  PMH / PSH: ***  OBJECTIVE:   There were no vitals taken for this visit.  ***  ASSESSMENT/PLAN:   Assessment & Plan Type 2 diabetes mellitus with complication (HCC)  CKD stage 3 due to type 2 diabetes mellitus (HCC)      Rayma Calandra, DO [facility]

## 2023-09-12 ENCOUNTER — Encounter: Payer: Self-pay | Admitting: Family Medicine

## 2023-09-12 NOTE — Progress Notes (Signed)
 [place] Death Certification completed via [facility]AVE for Mr Marcus [name]. [name] (date of birth 01-Mar-1976) Cause of death: Hypertensive Heart Disease (possible) Manner of Death: Natural\  Case not referred to Medical Examiner's office

## 2023-09-13 ENCOUNTER — Ambulatory Visit: Payer: MEDICAID | Admitting: Family Medicine

## 2023-09-13 DEATH — deceased

## 2023-11-21 ENCOUNTER — Ambulatory Visit: Payer: MEDICAID | Admitting: Podiatry
